# Patient Record
Sex: Male | Born: 1955 | Race: White | Hispanic: No | Marital: Single | Smoking: Former smoker
Health system: Southern US, Community
[De-identification: ages and names within clinical notes are randomized; demographics above are authoritative.]

## PROBLEM LIST (undated history)

## (undated) DIAGNOSIS — F17200 Nicotine dependence, unspecified, uncomplicated: Secondary | ICD-10-CM

## (undated) DIAGNOSIS — K0889 Other specified disorders of teeth and supporting structures: Secondary | ICD-10-CM

## (undated) DIAGNOSIS — Z923 Personal history of irradiation: Secondary | ICD-10-CM

## (undated) DIAGNOSIS — R221 Localized swelling, mass and lump, neck: Secondary | ICD-10-CM

## (undated) DIAGNOSIS — R634 Abnormal weight loss: Secondary | ICD-10-CM

## (undated) DIAGNOSIS — R490 Dysphonia: Secondary | ICD-10-CM

## (undated) DIAGNOSIS — C801 Malignant (primary) neoplasm, unspecified: Secondary | ICD-10-CM

## (undated) DIAGNOSIS — R070 Pain in throat: Secondary | ICD-10-CM

## (undated) DIAGNOSIS — R0602 Shortness of breath: Secondary | ICD-10-CM

## (undated) HISTORY — DX: Pain in throat: R07.0

## (undated) HISTORY — DX: Personal history of irradiation: Z92.3

## (undated) HISTORY — DX: Abnormal weight loss: R63.4

---

## 1996-01-18 HISTORY — PX: OTHER SURGICAL HISTORY: SHX169

## 2013-04-17 DIAGNOSIS — R221 Localized swelling, mass and lump, neck: Secondary | ICD-10-CM

## 2013-04-17 HISTORY — DX: Localized swelling, mass and lump, neck: R22.1

## 2013-04-29 ENCOUNTER — Emergency Department (INDEPENDENT_AMBULATORY_CARE_PROVIDER_SITE_OTHER)
Admission: EM | Admit: 2013-04-29 | Discharge: 2013-04-29 | Disposition: A | Discharge status: Home / Self Care | Payer: BC Managed Care – PPO | Source: Home / Self Care | Attending: Emergency Medicine | Admitting: Emergency Medicine

## 2013-04-29 ENCOUNTER — Encounter (HOSPITAL_COMMUNITY): Payer: Self-pay | Admitting: Emergency Medicine

## 2013-04-29 DIAGNOSIS — R490 Dysphonia: Secondary | ICD-10-CM

## 2013-04-29 DIAGNOSIS — R590 Localized enlarged lymph nodes: Secondary | ICD-10-CM

## 2013-04-29 DIAGNOSIS — J029 Acute pharyngitis, unspecified: Secondary | ICD-10-CM

## 2013-04-29 DIAGNOSIS — R599 Enlarged lymph nodes, unspecified: Secondary | ICD-10-CM

## 2013-04-29 LAB — POCT RAPID STREP A: STREPTOCOCCUS, GROUP A SCREEN (DIRECT): NEGATIVE

## 2013-04-29 MED ORDER — HYDROCODONE-ACETAMINOPHEN 5-325 MG PO TABS: ORAL_TABLET | ORAL | Status: DC

## 2013-04-29 NOTE — Discharge Instructions (Signed)
Pharyngitis °Pharyngitis is redness, pain, and swelling (inflammation) of your pharynx.  °CAUSES  °Pharyngitis is usually caused by infection. Most of the time, these infections are from viruses (viral) and are part of a cold. However, sometimes pharyngitis is caused by bacteria (bacterial). Pharyngitis can also be caused by allergies. Viral pharyngitis may be spread from person to person by coughing, sneezing, and personal items or utensils (cups, forks, spoons, toothbrushes). Bacterial pharyngitis may be spread from person to person by more intimate contact, such as kissing.  °SIGNS AND SYMPTOMS  °Symptoms of pharyngitis include:   °· Sore throat.   °· Tiredness (fatigue).   °· Low-grade fever.   °· Headache. °· Joint pain and muscle aches. °· Skin rashes. °· Swollen lymph nodes. °· Plaque-like film on throat or tonsils (often seen with bacterial pharyngitis). °DIAGNOSIS  °Your health care provider will ask you questions about your illness and your symptoms. Your medical history, along with a physical exam, is often all that is needed to diagnose pharyngitis. Sometimes, a rapid strep test is done. Other lab tests may also be done, depending on the suspected cause.  °TREATMENT  °Viral pharyngitis will usually get better in 3 4 days without the use of medicine. Bacterial pharyngitis is treated with medicines that kill germs (antibiotics).  °HOME CARE INSTRUCTIONS  °· Drink enough water and fluids to keep your urine clear or pale yellow.   °· Only take over-the-counter or prescription medicines as directed by your health care provider:   °· If you are prescribed antibiotics, make sure you finish them even if you start to feel better.   °· Do not take aspirin.   °· Get lots of rest.   °· Gargle with 8 oz of salt water (½ tsp of salt per 1 qt of water) as often as every 1 2 hours to soothe your throat.   °· Throat lozenges (if you are not at risk for choking) or sprays may be used to soothe your throat. °SEEK MEDICAL  CARE IF:  °· You have large, tender lumps in your neck. °· You have a rash. °· You cough up green, yellow-brown, or bloody spit. °SEEK IMMEDIATE MEDICAL CARE IF:  °· Your neck becomes stiff. °· You drool or are unable to swallow liquids. °· You vomit or are unable to keep medicines or liquids down. °· You have severe pain that does not go away with the use of recommended medicines. °· You have trouble breathing (not caused by a stuffy nose). °MAKE SURE YOU:  °· Understand these instructions. °· Will watch your condition. °· Will get help right away if you are not doing well or get worse. °Document Released: 01/03/2005 Document Revised: 10/24/2012 Document Reviewed: 09/10/2012 °[employer]® Patient Information ©2014 [employer]. ° °

## 2013-04-29 NOTE — ED Notes (Signed)
Sore throat for 2 weeks.  Denies any fever, denies sinus drainage.  Denies cough any more than usual.  Patient has visible fullness to right, lower jaw, in front of left ear.  When asked about this , patient declined having any concern or even noticing this fullness develop.

## 2013-04-29 NOTE — ED Provider Notes (Signed)
Chief Complaint   Chief Complaint  Patient presents with  . Sore Throat    History of Present Illness   Derek Morgan is a 58 year old male who has had a two week history of sore throat and hoarseness. His neck has been swollen on the right side for about the past month. He's had a dry, nonproductive cough. He denies any fever, chills, headache, or hemoptysis. He does smoke one of the half packs of cigarettes per day.   Review of Systems   Other than as noted above, the patient denies any of the following symptoms. Systemic:  No fever, chills, sweats, myalgias, or headache. Eye:  No redness, pain or drainage. ENT:  No earache, nasal congestion, sneezing, rhinorrhea, sinus pressure, sinus pain, or post nasal drip. Lungs:  No cough, sputum production, wheezing, shortness of breath, or chest pain. GI:  No abdominal pain, nausea, vomiting, or diarrhea. Skin:  No rash.  [place]   Past medical history, family history, social history, meds, and allergies were reviewed.   Physical Exam     Vital signs:  BP 187/92  Pulse 108  Temp(Src) 98.7 F (37.1 C) (Oral)  Resp 18  SpO2 98% General:  Alert, in no distress. Phonation was normal, no drooling, and patient was able to handle secretions well.  Eye:  No conjunctival injection or drainage. Lids were normal. ENT:  TMs and canals were normal, without erythema or inflammation.  Nasal mucosa was clear and uncongested, without drainage.  Mucous membranes were moist.  Exam of pharynx was unremarkable. There was no erythema or exudate, no ulcerations or mass..  There were no oral ulcerations or lesions. There was no bulging of the tonsillar pillars, and the uvula was midline. Neck:  Supple, He has a large, rockhard mass of lymphadenopathy in the right anterior cervical area which was nontender to touch. Lungs:  No respiratory distress.  Lungs were clear to auscultation, without wheezes, rales or rhonchi.  Breath sounds were clear and equal  bilaterally.  Heart:  Regular rhythm, without gallops, murmers or rubs. Skin:  Clear, warm, and dry, without rash or lesions.  Labs   Results for orders placed during the hospital encounter of 04/29/13  POCT RAPID STREP A ([facility] ONLY)      Result Value Ref Range   Streptococcus, Group A Screen (Direct) NEGATIVE  NEGATIVE    Assessment   The primary encounter diagnosis was Sore throat. Diagnoses of Hoarseness and Cervical lymphadenopathy were also pertinent to this visit.  I am concerned about the diagnosis of cancer. I have told the patient this. I have made an appointment for him to see Dr. Benjamine Mola later on this week.  Plan     1.  Meds:  The following meds were prescribed:   Discharge Medication List as of 04/29/2013 10:02 AM    START taking these medications   Details  HYDROcodone-acetaminophen (NORCO/VICODIN) 5-325 MG per tablet 1 to 2 tabs every 4 to 6 hours as needed for pain., Print        2.  Patient Education/Counseling:  The patient was given appropriate handouts, self care instructions, and instructed in symptomatic relief, including hot saline gargles, and throat lozenges.  3.  Follow up:  The patient was told to follow up here if no better in 3 to 4 days, or sooner if becoming worse in any way, and given some red flag symptoms such as difficulty swallowing or breathing which would prompt immediate return.  Follow-up with Dr.  Teoh later on this week.     Harden Mo, MD 04/29/13 [phone]

## 2013-05-01 LAB — CULTURE, GROUP A STREP

## 2013-05-02 ENCOUNTER — Other Ambulatory Visit (INDEPENDENT_AMBULATORY_CARE_PROVIDER_SITE_OTHER): Payer: Self-pay | Admitting: Otolaryngology

## 2013-05-02 DIAGNOSIS — J3800 Paralysis of vocal cords and larynx, unspecified: Secondary | ICD-10-CM

## 2013-05-02 DIAGNOSIS — R221 Localized swelling, mass and lump, neck: Secondary | ICD-10-CM

## 2013-05-06 ENCOUNTER — Ambulatory Visit
Admission: RE | Admit: 2013-05-06 | Discharge: 2013-05-06 | Disposition: A | Discharge status: Home / Self Care | Payer: BC Managed Care – PPO | Source: Ambulatory Visit | Attending: Otolaryngology | Admitting: Otolaryngology

## 2013-05-06 DIAGNOSIS — J3800 Paralysis of vocal cords and larynx, unspecified: Secondary | ICD-10-CM

## 2013-05-06 DIAGNOSIS — R221 Localized swelling, mass and lump, neck: Secondary | ICD-10-CM

## 2013-05-06 MED ORDER — IOHEXOL 300 MG/ML  SOLN
75.0000 mL | Freq: Once | INTRAMUSCULAR | Status: AC | PRN
  Administered 2013-05-06: 75 mL via INTRAVENOUS

## 2013-05-08 ENCOUNTER — Other Ambulatory Visit: Payer: Self-pay | Admitting: Otolaryngology

## 2013-05-09 ENCOUNTER — Encounter (HOSPITAL_BASED_OUTPATIENT_CLINIC_OR_DEPARTMENT_OTHER): Payer: Self-pay | Admitting: *Deleted

## 2013-05-09 NOTE — Progress Notes (Signed)
Pt has no family here-smokes-a friend is coming with him-he will ck about someone staying with him post op

## 2013-05-09 NOTE — Progress Notes (Signed)
Reviewed case with dr crews-per anesthesia consult request dr teoh-dr crews said surgery needs to be done main or due to airway issues-dr teohs office notified.

## 2013-05-14 ENCOUNTER — Encounter (HOSPITAL_BASED_OUTPATIENT_CLINIC_OR_DEPARTMENT_OTHER): Admission: RE | Payer: Self-pay | Source: Ambulatory Visit

## 2013-05-14 ENCOUNTER — Ambulatory Visit (HOSPITAL_BASED_OUTPATIENT_CLINIC_OR_DEPARTMENT_OTHER): Admission: RE | Admit: 2013-05-14 | Payer: BLUE CROSS/BLUE SHIELD | Source: Ambulatory Visit | Admitting: Otolaryngology

## 2013-05-14 HISTORY — DX: Dysphonia: R49.0

## 2013-05-14 HISTORY — DX: Nicotine dependence, unspecified, uncomplicated: F17.200

## 2013-05-14 HISTORY — DX: Other specified disorders of teeth and supporting structures: K08.89

## 2013-05-14 HISTORY — DX: Localized swelling, mass and lump, neck: R22.1

## 2013-05-14 SURGERY — LARYNGOSCOPY, DIRECT
Anesthesia: General | Site: Neck | Laterality: Right

## 2013-05-16 ENCOUNTER — Encounter (HOSPITAL_COMMUNITY): Payer: Self-pay | Admitting: Pharmacy Technician

## 2013-05-21 ENCOUNTER — Other Ambulatory Visit (HOSPITAL_COMMUNITY): Payer: Self-pay | Admitting: *Deleted

## 2013-05-21 ENCOUNTER — Encounter (HOSPITAL_COMMUNITY): Payer: Self-pay | Admitting: *Deleted

## 2013-05-21 NOTE — Progress Notes (Signed)
Pt states he has several loose teeth.

## 2013-05-22 ENCOUNTER — Encounter (HOSPITAL_COMMUNITY): Payer: BC Managed Care – PPO | Admitting: Anesthesiology

## 2013-05-22 ENCOUNTER — Ambulatory Visit (HOSPITAL_COMMUNITY)
Admission: RE | Admit: 2013-05-22 | Discharge: 2013-05-22 | Disposition: A | Discharge status: Home / Self Care | Payer: BC Managed Care – PPO | Source: Ambulatory Visit | Attending: Otolaryngology | Admitting: Otolaryngology

## 2013-05-22 ENCOUNTER — Encounter (HOSPITAL_COMMUNITY)
Admission: RE | Disposition: A | Discharge status: Home / Self Care | Payer: Self-pay | Source: Ambulatory Visit | Attending: Otolaryngology

## 2013-05-22 ENCOUNTER — Encounter (HOSPITAL_COMMUNITY): Payer: Self-pay | Admitting: Anesthesiology

## 2013-05-22 ENCOUNTER — Ambulatory Visit (HOSPITAL_COMMUNITY): Payer: BC Managed Care – PPO | Admitting: Anesthesiology

## 2013-05-22 DIAGNOSIS — M629 Disorder of muscle, unspecified: Secondary | ICD-10-CM | POA: Insufficient documentation

## 2013-05-22 DIAGNOSIS — F17200 Nicotine dependence, unspecified, uncomplicated: Secondary | ICD-10-CM | POA: Insufficient documentation

## 2013-05-22 DIAGNOSIS — J3801 Paralysis of vocal cords and larynx, unilateral: Secondary | ICD-10-CM | POA: Insufficient documentation

## 2013-05-22 DIAGNOSIS — M2420 Disorder of ligament, unspecified site: Secondary | ICD-10-CM | POA: Insufficient documentation

## 2013-05-22 DIAGNOSIS — R599 Enlarged lymph nodes, unspecified: Secondary | ICD-10-CM | POA: Insufficient documentation

## 2013-05-22 DIAGNOSIS — Z9089 Acquired absence of other organs: Secondary | ICD-10-CM

## 2013-05-22 DIAGNOSIS — J358 Other chronic diseases of tonsils and adenoids: Secondary | ICD-10-CM | POA: Insufficient documentation

## 2013-05-22 HISTORY — PX: MASS BIOPSY: SHX5445

## 2013-05-22 HISTORY — PX: DIRECT LARYNGOSCOPY: SHX5326

## 2013-05-22 HISTORY — PX: TONSILLECTOMY: SHX5217

## 2013-05-22 LAB — CBC
HEMATOCRIT: 45.3 % (ref 39.0–52.0)
Hemoglobin: 15.6 g/dL (ref 13.0–17.0)
MCH: 33.8 pg (ref 26.0–34.0)
MCHC: 34.4 g/dL (ref 30.0–36.0)
MCV: 98.3 fL (ref 78.0–100.0)
Platelets: 347 10*3/uL (ref 150–400)
RBC: 4.61 MIL/uL (ref 4.22–5.81)
RDW: 13.1 % (ref 11.5–15.5)
WBC: 11.1 10*3/uL — ABNORMAL HIGH (ref 4.0–10.5)

## 2013-05-22 SURGERY — LARYNGOSCOPY, DIRECT
Anesthesia: General | Site: Neck | Laterality: Right

## 2013-05-22 MED ORDER — EPINEPHRINE HCL (NASAL) 0.1 % NA SOLN
NASAL | Status: AC
  Filled 2013-05-22: qty 30

## 2013-05-22 MED ORDER — LIDOCAINE-EPINEPHRINE 1 %-1:100000 IJ SOLN
INTRAMUSCULAR | Status: AC
  Filled 2013-05-22: qty 1

## 2013-05-22 MED ORDER — OXYCODONE HCL 5 MG/5ML PO SOLN: 5.0000 mg | Freq: Once | ORAL | Status: AC | PRN

## 2013-05-22 MED ORDER — FENTANYL CITRATE 0.05 MG/ML IJ SOLN
INTRAMUSCULAR | Status: DC | PRN
  Administered 2013-05-22 (×2): 50 ug via INTRAVENOUS
  Administered 2013-05-22: 100 ug via INTRAVENOUS
  Administered 2013-05-22: 50 ug via INTRAVENOUS

## 2013-05-22 MED ORDER — ROCURONIUM BROMIDE 50 MG/5ML IV SOLN
INTRAVENOUS | Status: AC
  Filled 2013-05-22: qty 1

## 2013-05-22 MED ORDER — PROPOFOL 10 MG/ML IV BOLUS
INTRAVENOUS | Status: DC | PRN
  Administered 2013-05-22: 200 mg via INTRAVENOUS

## 2013-05-22 MED ORDER — DEXAMETHASONE SODIUM PHOSPHATE 4 MG/ML IJ SOLN
INTRAMUSCULAR | Status: DC | PRN
  Administered 2013-05-22: 8 mg via INTRAVENOUS

## 2013-05-22 MED ORDER — NEOSTIGMINE METHYLSULFATE 10 MG/10ML IV SOLN
INTRAVENOUS | Status: AC
  Filled 2013-05-22: qty 1

## 2013-05-22 MED ORDER — OXYCODONE HCL 5 MG/5ML PO SOLN: 5.0000 mg | ORAL | Status: DC | PRN

## 2013-05-22 MED ORDER — ROCURONIUM BROMIDE 100 MG/10ML IV SOLN
INTRAVENOUS | Status: DC | PRN
Start: 2013-05-22 — End: 2013-05-22
  Administered 2013-05-22: 25 mg via INTRAVENOUS

## 2013-05-22 MED ORDER — GLYCOPYRROLATE 0.2 MG/ML IJ SOLN
INTRAMUSCULAR | Status: DC | PRN
  Administered 2013-05-22: 0.4 mg via INTRAVENOUS

## 2013-05-22 MED ORDER — SODIUM CHLORIDE 0.9 % IR SOLN
Status: DC | PRN
  Administered 2013-05-22: 1000 mL

## 2013-05-22 MED ORDER — GLYCOPYRROLATE 0.2 MG/ML IJ SOLN
INTRAMUSCULAR | Status: AC
  Filled 2013-05-22: qty 3

## 2013-05-22 MED ORDER — HYDROMORPHONE HCL PF 1 MG/ML IJ SOLN: 0.2500 mg | INTRAMUSCULAR | Status: DC | PRN

## 2013-05-22 MED ORDER — NEOSTIGMINE METHYLSULFATE 10 MG/10ML IV SOLN
INTRAVENOUS | Status: DC | PRN
  Administered 2013-05-22: 3 mg via INTRAVENOUS

## 2013-05-22 MED ORDER — OXYMETAZOLINE HCL 0.05 % NA SOLN
NASAL | Status: AC
  Filled 2013-05-22: qty 15

## 2013-05-22 MED ORDER — PHENYLEPHRINE HCL 10 MG/ML IJ SOLN
INTRAMUSCULAR | Status: DC | PRN
  Administered 2013-05-22 (×2): 80 ug via INTRAVENOUS

## 2013-05-22 MED ORDER — ONDANSETRON HCL 4 MG/2ML IJ SOLN
INTRAMUSCULAR | Status: DC | PRN
  Administered 2013-05-22: 4 mg via INTRAVENOUS

## 2013-05-22 MED ORDER — ONDANSETRON HCL 4 MG/2ML IJ SOLN
INTRAMUSCULAR | Status: AC
  Filled 2013-05-22: qty 2

## 2013-05-22 MED ORDER — METOCLOPRAMIDE HCL 5 MG/ML IJ SOLN: 10.0000 mg | Freq: Once | INTRAMUSCULAR | Status: DC | PRN

## 2013-05-22 MED ORDER — 0.9 % SODIUM CHLORIDE (POUR BTL) OPTIME
TOPICAL | Status: DC | PRN
  Administered 2013-05-22: 1000 mL

## 2013-05-22 MED ORDER — LIDOCAINE HCL (CARDIAC) 20 MG/ML IV SOLN
INTRAVENOUS | Status: AC
  Filled 2013-05-22: qty 5

## 2013-05-22 MED ORDER — CEFAZOLIN SODIUM-DEXTROSE 2-3 GM-% IV SOLR
INTRAVENOUS | Status: DC | PRN
  Administered 2013-05-22: 2 g via INTRAVENOUS

## 2013-05-22 MED ORDER — OXYMETAZOLINE HCL 0.05 % NA SOLN
NASAL | Status: DC | PRN
  Administered 2013-05-22: 1 via NASAL

## 2013-05-22 MED ORDER — OXYCODONE HCL 5 MG PO TABS
ORAL_TABLET | ORAL | Status: AC
  Filled 2013-05-22: qty 1

## 2013-05-22 MED ORDER — DEXAMETHASONE SODIUM PHOSPHATE 4 MG/ML IJ SOLN
INTRAMUSCULAR | Status: AC
  Filled 2013-05-22: qty 3

## 2013-05-22 MED ORDER — AMOXICILLIN 400 MG/5ML PO SUSR: 800.0000 mg | Freq: Two times a day (BID) | ORAL | Status: AC

## 2013-05-22 MED ORDER — OXYCODONE HCL 5 MG PO TABS
5.0000 mg | ORAL_TABLET | Freq: Once | ORAL | Status: AC | PRN
  Administered 2013-05-22: 5 mg via ORAL

## 2013-05-22 MED ORDER — MIDAZOLAM HCL 5 MG/5ML IJ SOLN
INTRAMUSCULAR | Status: DC | PRN
  Administered 2013-05-22: 2 mg via INTRAVENOUS

## 2013-05-22 MED ORDER — LACTATED RINGERS IV SOLN
INTRAVENOUS | Status: DC
  Administered 2013-05-22 (×2): via INTRAVENOUS

## 2013-05-22 MED ORDER — HEMOSTATIC AGENTS (NO CHARGE) OPTIME
TOPICAL | Status: DC | PRN
  Administered 2013-05-22: 1 via TOPICAL

## 2013-05-22 MED ORDER — FENTANYL CITRATE 0.05 MG/ML IJ SOLN
INTRAMUSCULAR | Status: AC
  Filled 2013-05-22: qty 5

## 2013-05-22 SURGICAL SUPPLY — 51 items
ADH SKN CLS APL DERMABOND .7 (GAUZE/BANDAGES/DRESSINGS) ×3
BLADE 10 SAFETY STRL DISP (BLADE) ×3 IMPLANT
CANISTER SUCTION 2500CC (MISCELLANEOUS) ×5 IMPLANT
CATH ROBINSON RED A/P 10FR (CATHETERS) ×2 IMPLANT
COVER LIGHT HANDLE  DEROYL (MISCELLANEOUS) ×2 IMPLANT
COVER MAYO STAND STRL (DRAPES) ×2 IMPLANT
COVER TABLE BACK 60X90 (DRAPES) ×5 IMPLANT
DERMABOND ADVANCED (GAUZE/BANDAGES/DRESSINGS) ×2
DERMABOND ADVANCED .7 DNX12 (GAUZE/BANDAGES/DRESSINGS) IMPLANT
DRAPE PROXIMA HALF (DRAPES) ×3 IMPLANT
ELECT COATED BLADE 2.86 ST (ELECTRODE) ×2 IMPLANT
ELECT REM PT RETURN 9FT ADLT (ELECTROSURGICAL) ×5
ELECT REM PT RETURN 9FT PED (ELECTROSURGICAL)
ELECTRODE REM PT RETRN 9FT PED (ELECTROSURGICAL) IMPLANT
ELECTRODE REM PT RTRN 9FT ADLT (ELECTROSURGICAL) IMPLANT
GAUZE SPONGE 4X4 16PLY XRAY LF (GAUZE/BANDAGES/DRESSINGS) ×7 IMPLANT
GLOVE BIO SURGEON STRL SZ7.5 (GLOVE) ×5 IMPLANT
GLOVE BIOGEL PI IND STRL 6.5 (GLOVE) IMPLANT
GLOVE BIOGEL PI INDICATOR 6.5 (GLOVE) ×4
GLOVE ECLIPSE 7.5 STRL STRAW (GLOVE) ×3 IMPLANT
GLOVE SURG SS PI 6.5 STRL IVOR (GLOVE) ×4 IMPLANT
GLOVE SURG SS PI 7.5 STRL IVOR (GLOVE) ×2 IMPLANT
GOWN STRL REUS W/ TWL LRG LVL3 (GOWN DISPOSABLE) ×6 IMPLANT
GOWN STRL REUS W/TWL LRG LVL3 (GOWN DISPOSABLE) ×10
GUARD TEETH (MISCELLANEOUS) ×5 IMPLANT
HEMOSTAT SURGICEL 2X14 (HEMOSTASIS) ×2 IMPLANT
KIT BASIN OR (CUSTOM PROCEDURE TRAY) ×5 IMPLANT
KIT ROOM TURNOVER OR (KITS) ×5 IMPLANT
MARKER SKIN DUAL TIP RULER LAB (MISCELLANEOUS) ×2 IMPLANT
NS IRRIG 1000ML POUR BTL (IV SOLUTION) ×5 IMPLANT
PACK SURGICAL SETUP 50X90 (CUSTOM PROCEDURE TRAY) ×5 IMPLANT
PAD ARMBOARD 7.5X6 YLW CONV (MISCELLANEOUS) ×10 IMPLANT
PATTIES SURGICAL .5 X1 (DISPOSABLE) ×2 IMPLANT
PENCIL BUTTON HOLSTER BLD 10FT (ELECTRODE) ×2 IMPLANT
SOLUTION ANTI FOG 6CC (MISCELLANEOUS) IMPLANT
SPECIMEN JAR SMALL (MISCELLANEOUS) ×4 IMPLANT
SPONGE GAUZE 4X4 12PLY (GAUZE/BANDAGES/DRESSINGS) ×5 IMPLANT
SPONGE TONSIL 1 RF SGL (DISPOSABLE) ×5 IMPLANT
SURGILUBE 2OZ TUBE FLIPTOP (MISCELLANEOUS) ×3 IMPLANT
SUT VIC AB 3-0 SH 27 (SUTURE)
SUT VIC AB 3-0 SH 27X BRD (SUTURE) IMPLANT
SUT VICRYL 4-0 PS2 18IN ABS (SUTURE) ×2 IMPLANT
SYR BULB 3OZ (MISCELLANEOUS) ×5 IMPLANT
TOWEL OR 17X24 6PK STRL BLUE (TOWEL DISPOSABLE) ×10 IMPLANT
TOWEL OR 17X26 10 PK STRL BLUE (TOWEL DISPOSABLE) ×5 IMPLANT
TRAY ENT MC OR (CUSTOM PROCEDURE TRAY) ×2 IMPLANT
TUBE CONNECTING 12'X1/4 (SUCTIONS) ×2
TUBE CONNECTING 12X1/4 (SUCTIONS) ×5 IMPLANT
TUBE SALEM SUMP 12R W/ARV (TUBING) ×2 IMPLANT
WAND COBLATOR 70 EVAC XTRA (SURGICAL WAND) ×5 IMPLANT
WATER STERILE IRR 1000ML POUR (IV SOLUTION) ×3 IMPLANT

## 2013-05-22 NOTE — Brief Op Note (Signed)
05/22/2013  12:45 PM  PATIENT:  Derek Morgan  58 y.o. male  PRE-OPERATIVE DIAGNOSIS:  RIGHT NECK AND TONSIL MASS  POST-OPERATIVE DIAGNOSIS:  RIGHT NECK AND TONSIL MASS  PROCEDURE:  Procedure(s): DIRECT LARYNGOSCOPY (N/A) RIGHT TONSILLECTOMY (Right)  INCISIONAL BIOPSY NECK MASS (Right)  SURGEON:  Surgeon(s) and Role:    * Ascencion Dike, MD - Primary  PHYSICIAN ASSISTANT:   ASSISTANTS: none   ANESTHESIA:   general  EBL:  Total I/O In: 1010 [I.V.:1010] Out: -   BLOOD ADMINISTERED:none  DRAINS: none   LOCAL MEDICATIONS USED:  LIDOCAINE   SPECIMEN:  Source of Specimen:  Right tonsil and right neck mass biopsy  DISPOSITION OF SPECIMEN:  PATHOLOGY  COUNTS:  YES  TOURNIQUET:  * No tourniquets in log *  DICTATION: .Other Dictation: Dictation Number [id number]  PLAN OF CARE: Discharge to home after PACU  PATIENT DISPOSITION:  PACU - hemodynamically stable.   Delay start of Pharmacological VTE agent (>24hrs) due to surgical blood loss or risk of bleeding: not applicable

## 2013-05-22 NOTE — Progress Notes (Signed)
Patient states he does not have anyone to stay home with him this evening. Dr. Anthony Sar notified and states he is fine with patient going home by himself.

## 2013-05-22 NOTE — Anesthesia Preprocedure Evaluation (Addendum)
Anesthesia Evaluation  Patient identified by MRN, date of birth, ID band Patient awake    Reviewed: Allergy & Precautions, H&P , NPO status , Patient's Chart, lab work & pertinent test results, reviewed documented beta blocker date and time   Airway Mallampati: II TM Distance: >3 FB Neck ROM: Limited  Mouth opening: Limited Mouth Opening  Dental  (+) Loose   Pulmonary neg pulmonary ROS, Current Smoker,  breath sounds clear to auscultation        Cardiovascular negative cardio ROS  Rhythm:regular     Neuro/Psych negative neurological ROS  negative psych ROS   GI/Hepatic negative GI ROS, Neg liver ROS,   Endo/Other  negative endocrine ROS  Renal/GU negative Renal ROS  negative genitourinary   Musculoskeletal   Abdominal   Peds  Hematology negative hematology ROS (+)   Anesthesia Other Findings See surgeon's H&P   Reproductive/Obstetrics negative OB ROS                        Anesthesia Physical Anesthesia Plan  ASA: II  Anesthesia Plan: General   Post-op Pain Management:    Induction: Intravenous  Airway Management Planned: Oral ETT and Video Laryngoscope Planned  Additional Equipment:   Intra-op Plan:   Post-operative Plan: Extubation in OR  Informed Consent: I have reviewed the patients History and Physical, chart, labs and discussed the procedure including the risks, benefits and alternatives for the proposed anesthesia with the patient or authorized representative who has indicated his/her understanding and acceptance.   Dental Advisory Given  Plan Discussed with: CRNA and Surgeon  Anesthesia Plan Comments:        Anesthesia Quick Evaluation

## 2013-05-22 NOTE — Anesthesia Procedure Notes (Signed)
Procedure Name: Intubation Date/Time: 05/22/2013 11:52 AM Performed by: Eligha Bridegroom Pre-anesthesia Checklist: Emergency Drugs available, Patient identified, Timeout performed, Suction available and Patient being monitored Patient Re-evaluated:Patient Re-evaluated prior to inductionOxygen Delivery Method: Circle system utilized Preoxygenation: Pre-oxygenation with 100% oxygen Intubation Type: IV induction Ventilation: Oral airway inserted - appropriate to patient size and Mask ventilation without difficulty Grade View: Grade III Tube type: Oral Tube size: 7.5 mm Number of attempts: 1 Airway Equipment and Method: Video-laryngoscopy Placement Confirmation: ETT inserted through vocal cords under direct vision and breath sounds checked- equal and bilateral Secured at: 21 cm Dental Injury: Teeth and Oropharynx as per pre-operative assessment

## 2013-05-22 NOTE — Transfer of Care (Signed)
Immediate Anesthesia Transfer of Care Note  Patient: Derek Morgan  Procedure(s) Performed: Procedure(s): DIRECT LARYNGOSCOPY (N/A) RIGHT TONSILLECTOMY (Right)  INCISIONAL BIOPSY NECK MASS (Right)  Patient Location: PACU  Anesthesia Type:General  Level of Consciousness: awake, alert  and oriented  Airway & Oxygen Therapy: Patient Spontanous Breathing and Patient connected to nasal cannula oxygen  Post-op Assessment: Report given to PACU RN and Post -op Vital signs reviewed and stable  Post vital signs: Reviewed and stable  Complications: No apparent anesthesia complications

## 2013-05-22 NOTE — H&P (Signed)
  H&P Update  Pt's original H&P dated 05/08/13 reviewed and placed in chart (to be scanned).  I personally examined the patient today.  No change in health. Proceed with right tonsillectomy, direct laryngoscopy, and neck mass biopsy.

## 2013-05-22 NOTE — Discharge Instructions (Signed)
[facility]ol[facility] Dowson WOO[facility]anish[facility] Drewry M.D., P.A[facility] Postoperative Instructions for Tonsillectomy  Activity Restrict activity at home for the first two days, resting as much as possible. Light indoor activity is best. You may usually return to school or work within a week but void strenuous activity and sports for two weeks. Sleep with your head elevated on 2-3 pillows for 3-4 days to help decrease swelling. Diet Due to tissue swelling and throat discomfort, you may have little desire to drink for several days. However fluids are very important to prevent dehydration. You will find that non-acidic juices, soups, popsicles, Jell-O, custard, puddings, and any soft or mashed foods taken in small quantities can be swallowed fairly easily. Try to increase your fluid and food intake as the discomfort subsides. It is recommended that a child receive 1-1/2 quarts of fluid in a 24-hour period. Adult require twice this amount.  Discomfort Your sore throat may be relieved by applying an ice collar to your neck and/or by taking Tylenol. You may experience an earache, which is due to referred pain from the throat. Referred ear pain is commonly felt at night when trying to rest.  Bleeding                         Although rare, there is risk of having some bleeding during the first 2 weeks after having a T&A. This usually happens between days 7-10 postoperatively. If you or your child should have any bleeding, try to remain calm. We recommend sitting up quietly in a chair and gently spitting out the blood into a bowl. For adults, gargling gently with ice water may help. If the bleeding does not stop after a short time (5 minutes), is more than 1 teaspoonful, or if you become worried, please call our office at [phone] or go directly to the nearest hospital emergency room. Do not eat or drink anything prior to going to the hospital as you may need to be taken to the operating room in order to control the bleeding. GENERAL  CONSIDERATIONS 1. Brush your teeth regularly. Avoid mouthwashes and gargles for three weeks. You may gargle gently with warm salt-water as necessary or spray with Chloraseptic. You may make salt-water by placing 2 teaspoons of table salt into a quart of fresh water. Warm the salt-water in a microwave to a luke warm temperature.  2. Avoid exposure to colds and upper respiratory infections if possible.  3. If you look into a mirror or into your child's mouth, you will see white-gray patches in the back of the throat. This is normal after having a T&A and is like a scab that forms on the skin after an abrasion. It will disappear once the back of the throat heals completely. However, it may cause a noticeable odor; this too will disappear with time. Again, warm salt-water gargles may be used to help keep the throat clean and promote healing.  4. You may notice a temporary change in voice quality, such as a higher pitched voice or a nasal sound, until healing is complete. This may last for 1-2 weeks and should resolve.  5. Do not take or give you child any medications that we have not prescribed or recommended.  6. Snoring may occur, especially at night, for the first week after a T&A. It is due to swelling of the soft palate and will usually resolve.  Please call our office at [phone] if you have any questions.   

## 2013-05-22 NOTE — Anesthesia Postprocedure Evaluation (Signed)
Anesthesia Post Note  Patient: Derek Morgan  Procedure(s) Performed: Procedure(s) (LRB): DIRECT LARYNGOSCOPY (N/A) RIGHT TONSILLECTOMY (Right)  INCISIONAL BIOPSY NECK MASS (Right)  Anesthesia type: General  Patient location: PACU  Post pain: Pain level controlled  Post assessment: Patient's Cardiovascular Status Stable  Last Vitals:  Filed Vitals:   05/22/13 1330  BP: 161/79  Pulse: 76  Temp: 36.9 C  Resp: 15    Post vital signs: Reviewed and stable  Level of consciousness: alert  Complications: No apparent anesthesia complications

## 2013-05-22 NOTE — OR Nursing (Signed)
1210: First procedure ended.

## 2013-05-23 NOTE — Op Note (Signed)
NAMEJOEVANNI, RODDEY               ACCOUNT NO.:  [id number]  MEDICAL RECORD NO.:  [id number]  LOCATION:  [facility]                         FACILITY:  [facility]  PHYSICIAN:  Leta Baptist, MD            DATE OF BIRTH:  1955-02-25  DATE OF PROCEDURE:  05/22/2013 DATE OF DISCHARGE:  05/22/2013                              OPERATIVE REPORT   SURGEON:  Leta Baptist, MD  PREOPERATIVE DIAGNOSES: 1. Right neck mass. 2. Right tonsil mass. 3. Right vocal cord paralysis.  POSTOPERATIVE DIAGNOSES: 1. Right neck mass. 2. Right tonsil mass. 3. Right vocal cord paralysis.  PROCEDURE PERFORMED: 1. Right tonsillectomy. 2. Incisional biopsy of the right neck mass. 3. Direct laryngoscopy.  ANESTHESIA:  General endotracheal tube anesthesia.  COMPLICATIONS:  None.  ESTIMATED BLOOD LOSS:  Minimal.  INDICATION FOR PROCEDURE:  The patient is a 58 year old male with a history of an enlarging right neck mass.  He previously underwent fine- needle aspiration biopsy of the mass.  The result was inconclusive, but worrisome for malignancy.  He subsequently underwent a neck CT scan. The CT showed 6-cm right level 2 neck mass, with invasion noted into the right jugular vein.  In addition, an ill-defined mass was noted to involve the inferior aspect of the right tonsil and the upper aryepiglottic fold.  It should also be noted that the patient has paralysis of his right vocal cord.  Based on the above findings, the decision was made for the patient to undergo the above stated procedure for definitive diagnosis of his tonsil and neck mass.  The risks, benefits, alternatives, and details of the procedure were discussed with the patient.  Questions were invited and answered.  Informed consent was obtained.  DESCRIPTION OF PROCEDURE:  The patient was taken to the operating room and placed supine on the operating table.  General endotracheal tube anesthesia was administered by the anesthesiologist.  The patient  was positioned and prepped and draped in a standard fashion for direct laryngoscopy and tonsillectomy.  Attention was first focused on the direct laryngoscopy portion of the case.  A Dedo laryngoscope was inserted via the oral cavity into the pharynx.  Examination of the tonsillar fossa reveals a 1-cm irregular mass at the inferior aspect of the right tonsillar fossa.  The mass appears to extend to the superior portion of the right area epiglottic fold.  The rest of the larynx was noted to be free of abnormality.  The epiglottis, vocal cords, arytenoids, piriform sinuses, and vallecula were all free of disease. The Dedo laryngoscope was withdrawn.  Crowe-Davis mouth gag was inserted into the oral cavity for exposure. The previously noted right inferior tonsillar mass was again noted.  The entire right tonsil was grasped with a straight Allis clamp and retracted medially.  It was resected free from the underlying pharyngeal constrictor muscles with the Coblator device.  Hemostasis was also achieved with the Coblator device.  The tonsillar fossa was irrigated with saline solution.  The Crowe-Davis mouth gag was removed.  The patient was repositioned for the right neck biopsy.  The patient was noted to have a large 6-cm mass within the right level 2  neck.  The mass was firm and nonmobile.  A 1% lidocaine with 1:100,000 epinephrine was injected at the planned site of incision.  A posterior neck incision was made, overlying the central portion of the mass.  The incision was carried down to the level of the platysma muscles.  Platysma muscle was carefully dissected free from the underlying soft tissue mass.  The great auricular nerve was identified and preserved.  Using a #15 blade, multiple incisional specimen was obtained from the large mass.  At least 4 pieces of large specimens were obtained and sent to the Pathology Department for permanent histologic identification.  Hemostasis  was achieved with Bovie electrocautery and Surgicel.  The surgical site was irrigated.  The incision was closed in layers with 4-0 Vicryl and Dermabond.  The care of the patient was turned over to the anesthesiologist.  The patient was awakened from anesthesia without difficulty.  He was extubated and transferred to the recovery room in good condition.  OPERATIVE FINDINGS: 1. A 1-cm mass was noted at right inferior tonsillar fossa. 2. A large 6-cm right level 2 neck mass was also noted.  SPECIMEN:  Right tonsil and incisional biopsy of the right neck mass.  FOLLOWUP CARE:  The patient will be discharged home once he is awake and alert.  He will be placed on oxycodone p.r.n. pain and amoxicillin p.o. b.i.d. for 5 days.  The patient will follow up in my office in approximately 1 week.     Leta Baptist, MD     ST/MEDQ  D:  05/22/2013  T:  05/23/2013  Job:  [id number]

## 2013-05-24 ENCOUNTER — Encounter (HOSPITAL_COMMUNITY): Payer: Self-pay | Admitting: Otolaryngology

## 2013-05-30 ENCOUNTER — Other Ambulatory Visit: Payer: Self-pay | Admitting: Otolaryngology

## 2013-06-03 ENCOUNTER — Encounter (HOSPITAL_COMMUNITY): Payer: Self-pay | Admitting: Pharmacist

## 2013-06-06 ENCOUNTER — Encounter (HOSPITAL_COMMUNITY): Payer: Self-pay | Admitting: *Deleted

## 2013-06-07 ENCOUNTER — Encounter (HOSPITAL_COMMUNITY): Payer: BC Managed Care – PPO | Admitting: Anesthesiology

## 2013-06-07 ENCOUNTER — Encounter (HOSPITAL_COMMUNITY)
Admission: RE | Disposition: A | Discharge status: Home / Self Care | Payer: Self-pay | Source: Ambulatory Visit | Attending: Otolaryngology

## 2013-06-07 ENCOUNTER — Ambulatory Visit (HOSPITAL_COMMUNITY): Payer: BC Managed Care – PPO | Admitting: Anesthesiology

## 2013-06-07 ENCOUNTER — Ambulatory Visit (HOSPITAL_COMMUNITY)
Admission: RE | Admit: 2013-06-07 | Discharge: 2013-06-07 | Disposition: A | Discharge status: Home / Self Care | Payer: BC Managed Care – PPO | Source: Ambulatory Visit | Attending: Otolaryngology | Admitting: Otolaryngology

## 2013-06-07 ENCOUNTER — Encounter (HOSPITAL_COMMUNITY): Payer: Self-pay | Admitting: *Deleted

## 2013-06-07 DIAGNOSIS — J3800 Paralysis of vocal cords and larynx, unspecified: Secondary | ICD-10-CM | POA: Insufficient documentation

## 2013-06-07 DIAGNOSIS — C4442 Squamous cell carcinoma of skin of scalp and neck: Secondary | ICD-10-CM | POA: Insufficient documentation

## 2013-06-07 DIAGNOSIS — R221 Localized swelling, mass and lump, neck: Secondary | ICD-10-CM

## 2013-06-07 DIAGNOSIS — F17200 Nicotine dependence, unspecified, uncomplicated: Secondary | ICD-10-CM | POA: Insufficient documentation

## 2013-06-07 HISTORY — PX: MASS BIOPSY: SHX5445

## 2013-06-07 LAB — CBC
HEMATOCRIT: 47.7 % (ref 39.0–52.0)
Hemoglobin: 16.3 g/dL (ref 13.0–17.0)
MCH: 34 pg (ref 26.0–34.0)
MCHC: 34.2 g/dL (ref 30.0–36.0)
MCV: 99.6 fL (ref 78.0–100.0)
Platelets: 344 10*3/uL (ref 150–400)
RBC: 4.79 MIL/uL (ref 4.22–5.81)
RDW: 13.2 % (ref 11.5–15.5)
WBC: 12.1 10*3/uL — ABNORMAL HIGH (ref 4.0–10.5)

## 2013-06-07 SURGERY — BIOPSY, MASS, NECK
Anesthesia: General | Site: Neck | Laterality: Right

## 2013-06-07 MED ORDER — MIDAZOLAM HCL 5 MG/5ML IJ SOLN
INTRAMUSCULAR | Status: DC | PRN
  Administered 2013-06-07: 1 mg via INTRAVENOUS

## 2013-06-07 MED ORDER — SUCCINYLCHOLINE CHLORIDE 20 MG/ML IJ SOLN
INTRAMUSCULAR | Status: DC | PRN
  Administered 2013-06-07: 100 mg via INTRAVENOUS

## 2013-06-07 MED ORDER — OXYCODONE-ACETAMINOPHEN 5-325 MG PO TABS: 1.0000 | ORAL_TABLET | ORAL | Status: DC | PRN

## 2013-06-07 MED ORDER — FENTANYL CITRATE 0.05 MG/ML IJ SOLN
INTRAMUSCULAR | Status: AC
  Filled 2013-06-07: qty 5

## 2013-06-07 MED ORDER — HYDROMORPHONE HCL PF 1 MG/ML IJ SOLN: 0.2500 mg | INTRAMUSCULAR | Status: DC | PRN

## 2013-06-07 MED ORDER — LACTATED RINGERS IV SOLN
INTRAVENOUS | Status: DC | PRN
  Administered 2013-06-07 (×2): via INTRAVENOUS

## 2013-06-07 MED ORDER — LIDOCAINE-EPINEPHRINE 1 %-1:100000 IJ SOLN
INTRAMUSCULAR | Status: DC | PRN
  Administered 2013-06-07: 20 mL via INTRADERMAL

## 2013-06-07 MED ORDER — LACTATED RINGERS IV SOLN
INTRAVENOUS | Status: DC
  Administered 2013-06-07: 11:00:00 via INTRAVENOUS

## 2013-06-07 MED ORDER — 0.9 % SODIUM CHLORIDE (POUR BTL) OPTIME
TOPICAL | Status: DC | PRN
  Administered 2013-06-07: 1000 mL

## 2013-06-07 MED ORDER — LIDOCAINE-EPINEPHRINE 1 %-1:100000 IJ SOLN
INTRAMUSCULAR | Status: AC
  Filled 2013-06-07: qty 1

## 2013-06-07 MED ORDER — LIDOCAINE HCL (CARDIAC) 20 MG/ML IV SOLN
INTRAVENOUS | Status: DC | PRN
Start: 2013-06-07 — End: 2013-06-07
  Administered 2013-06-07: 50 mg via INTRAVENOUS

## 2013-06-07 MED ORDER — PROPOFOL 10 MG/ML IV BOLUS
INTRAVENOUS | Status: DC | PRN
  Administered 2013-06-07: 120 mg via INTRAVENOUS

## 2013-06-07 MED ORDER — DEXTROSE 5 % IV SOLN
INTRAVENOUS | Status: DC | PRN
  Administered 2013-06-07: 13:00:00 via INTRAVENOUS

## 2013-06-07 MED ORDER — AMOXICILLIN 875 MG PO TABS: 875.0000 mg | ORAL_TABLET | Freq: Two times a day (BID) | ORAL | Status: DC

## 2013-06-07 MED ORDER — SUCCINYLCHOLINE CHLORIDE 20 MG/ML IJ SOLN
INTRAMUSCULAR | Status: AC
  Filled 2013-06-07: qty 1

## 2013-06-07 MED ORDER — PHENYLEPHRINE 40 MCG/ML (10ML) SYRINGE FOR IV PUSH (FOR BLOOD PRESSURE SUPPORT)
PREFILLED_SYRINGE | INTRAVENOUS | Status: AC
  Filled 2013-06-07: qty 10

## 2013-06-07 MED ORDER — ONDANSETRON HCL 4 MG/2ML IJ SOLN
INTRAMUSCULAR | Status: AC
  Filled 2013-06-07: qty 2

## 2013-06-07 MED ORDER — ONDANSETRON HCL 4 MG/2ML IJ SOLN
INTRAMUSCULAR | Status: DC | PRN
  Administered 2013-06-07: 4 mg via INTRAVENOUS

## 2013-06-07 MED ORDER — PROPOFOL 10 MG/ML IV BOLUS
INTRAVENOUS | Status: AC
  Filled 2013-06-07: qty 20

## 2013-06-07 MED ORDER — CEFAZOLIN SODIUM-DEXTROSE 2-3 GM-% IV SOLR
INTRAVENOUS | Status: DC | PRN
  Administered 2013-06-07: 2 g via INTRAVENOUS

## 2013-06-07 MED ORDER — FENTANYL CITRATE 0.05 MG/ML IJ SOLN
INTRAMUSCULAR | Status: DC | PRN
  Administered 2013-06-07 (×3): 50 ug via INTRAVENOUS

## 2013-06-07 MED ORDER — PHENYLEPHRINE HCL 10 MG/ML IJ SOLN
INTRAMUSCULAR | Status: DC | PRN
  Administered 2013-06-07: 80 ug via INTRAVENOUS

## 2013-06-07 MED ORDER — OXYCODONE HCL 5 MG PO TABS: 5.0000 mg | ORAL_TABLET | Freq: Once | ORAL | Status: DC | PRN

## 2013-06-07 MED ORDER — HEMOSTATIC AGENTS (NO CHARGE) OPTIME
TOPICAL | Status: DC | PRN
  Administered 2013-06-07: 1 via TOPICAL

## 2013-06-07 MED ORDER — LIDOCAINE HCL 4 % MT SOLN
OROMUCOSAL | Status: DC | PRN
  Administered 2013-06-07: 2.5 mL via TOPICAL

## 2013-06-07 MED ORDER — MIDAZOLAM HCL 2 MG/2ML IJ SOLN
INTRAMUSCULAR | Status: AC
  Filled 2013-06-07: qty 2

## 2013-06-07 MED ORDER — LIDOCAINE HCL (CARDIAC) 20 MG/ML IV SOLN
INTRAVENOUS | Status: AC
  Filled 2013-06-07: qty 5

## 2013-06-07 MED ORDER — OXYCODONE HCL 5 MG/5ML PO SOLN: 5.0000 mg | Freq: Once | ORAL | Status: DC | PRN

## 2013-06-07 SURGICAL SUPPLY — 38 items
ADH SKN CLS APL DERMABOND .7 (GAUZE/BANDAGES/DRESSINGS) ×1
BLADE 10 SAFETY STRL DISP (BLADE) ×3 IMPLANT
CANISTER SUCTION 2500CC (MISCELLANEOUS) ×3 IMPLANT
CLEANER TIP ELECTROSURG 2X2 (MISCELLANEOUS) ×3 IMPLANT
COVER SURGICAL LIGHT HANDLE (MISCELLANEOUS) ×3 IMPLANT
DERMABOND ADVANCED (GAUZE/BANDAGES/DRESSINGS) ×2
DERMABOND ADVANCED .7 DNX12 (GAUZE/BANDAGES/DRESSINGS) IMPLANT
ELECT COATED BLADE 2.86 ST (ELECTRODE) ×2 IMPLANT
ELECT NDL BLADE 2-5/6 (NEEDLE) IMPLANT
ELECT NEEDLE BLADE 2-5/6 (NEEDLE) ×3 IMPLANT
ELECT REM PT RETURN 9FT ADLT (ELECTROSURGICAL) ×3
ELECTRODE REM PT RTRN 9FT ADLT (ELECTROSURGICAL) ×1 IMPLANT
GLOVE BIO SURGEON STRL SZ7.5 (GLOVE) ×2 IMPLANT
GLOVE BIOGEL PI IND STRL 6.5 (GLOVE) IMPLANT
GLOVE BIOGEL PI IND STRL 7.0 (GLOVE) IMPLANT
GLOVE BIOGEL PI INDICATOR 6.5 (GLOVE) ×2
GLOVE BIOGEL PI INDICATOR 7.0 (GLOVE) ×2
GLOVE ECLIPSE 7.5 STRL STRAW (GLOVE) ×3 IMPLANT
GLOVE SURG SS PI 7.0 STRL IVOR (GLOVE) ×4 IMPLANT
GOWN STRL REUS W/ TWL LRG LVL3 (GOWN DISPOSABLE) ×2 IMPLANT
GOWN STRL REUS W/TWL LRG LVL3 (GOWN DISPOSABLE) ×6
HEMOSTAT SURGICEL 2X4 FIBR (HEMOSTASIS) ×2 IMPLANT
KIT BASIN OR (CUSTOM PROCEDURE TRAY) ×3 IMPLANT
KIT ROOM TURNOVER OR (KITS) ×3 IMPLANT
NS IRRIG 1000ML POUR BTL (IV SOLUTION) ×3 IMPLANT
PAD ARMBOARD 7.5X6 YLW CONV (MISCELLANEOUS) ×6 IMPLANT
PENCIL BUTTON HOLSTER BLD 10FT (ELECTRODE) ×3 IMPLANT
SPECIMEN JAR SMALL (MISCELLANEOUS) ×3 IMPLANT
SUT SILK 2 0 (SUTURE)
SUT SILK 2-0 18XBRD TIE 12 (SUTURE) IMPLANT
SUT SILK 3 0 (SUTURE) ×3
SUT SILK 3-0 18XBRD TIE 12 (SUTURE) IMPLANT
SUT VIC AB 4-0 SH 27 (SUTURE) ×3
SUT VIC AB 4-0 SH 27XBRD (SUTURE) IMPLANT
TOWEL OR 17X24 6PK STRL BLUE (TOWEL DISPOSABLE) ×3 IMPLANT
TOWEL OR 17X26 10 PK STRL BLUE (TOWEL DISPOSABLE) ×3 IMPLANT
TRAY ENT MC OR (CUSTOM PROCEDURE TRAY) ×3 IMPLANT
WATER STERILE IRR 1000ML POUR (IV SOLUTION) ×3 IMPLANT

## 2013-06-07 NOTE — Anesthesia Preprocedure Evaluation (Addendum)
Anesthesia Evaluation  Patient identified by MRN, date of birth, ID band Patient awake    Reviewed: Allergy & Precautions, H&P , NPO status , Patient's Chart, lab work & pertinent test results, reviewed documented beta blocker date and time   Airway Mallampati: III TM Distance: >3 FB Neck ROM: Full  Mouth opening: Limited Mouth Opening  Dental no notable dental hx. (+) Poor Dentition, Dental Advisory Given, Caps   Pulmonary Current Smoker,  breath sounds clear to auscultation  Pulmonary exam normal       Cardiovascular negative cardio ROS  Rhythm:Regular Rate:Normal     Neuro/Psych negative neurological ROS  negative psych ROS   GI/Hepatic negative GI ROS, Neg liver ROS,   Endo/Other  negative endocrine ROS  Renal/GU negative Renal ROS  negative genitourinary   Musculoskeletal   Abdominal   Peds  Hematology negative hematology ROS (+)   Anesthesia Other Findings 3 unit bridge upper right/front is loose as are 2-3 teeth below. Pt insists that he wear his eyeglasses into the OR and get them back as soon as he wakes up.Marland KitchenMarland KitchenEyeglasses given to circulator, Britt Boozer.  Reproductive/Obstetrics negative OB ROS                         Anesthesia Physical Anesthesia Plan  ASA: II  Anesthesia Plan: General   Post-op Pain Management:    Induction: Intravenous  Airway Management Planned: Oral ETT and Video Laryngoscope Planned  Additional Equipment:   Intra-op Plan:   Post-operative Plan: Extubation in OR  Informed Consent: I have reviewed the patients History and Physical, chart, labs and discussed the procedure including the risks, benefits and alternatives for the proposed anesthesia with the patient or authorized representative who has indicated his/her understanding and acceptance.   Dental advisory given  Plan Discussed with: CRNA  Anesthesia Plan Comments:         Anesthesia  Quick Evaluation

## 2013-06-07 NOTE — Transfer of Care (Signed)
Immediate Anesthesia Transfer of Care Note  Patient: Derek Morgan  Procedure(s) Performed: Procedure(s): EXCISIONAL BIOPSY OF RIGHT NECK MASS (Right)  Patient Location: PACU  Anesthesia Type:General  Level of Consciousness: awake and patient cooperative  Airway & Oxygen Therapy: Patient Spontanous Breathing and Patient connected to nasal cannula oxygen  Post-op Assessment: Report given to PACU RN, Post -op Vital signs reviewed and stable and Patient moving all extremities  Post vital signs: Reviewed and stable  Complications: No apparent anesthesia complications

## 2013-06-07 NOTE — Discharge Instructions (Signed)
Follow up with Dr. Benjamine Mola on 5/ /15 at 1:50pm.  Avoid heavy lifting greater than 20 lbs for 1 week. May resume other activities.  Regular diet as tolerated.

## 2013-06-07 NOTE — Anesthesia Postprocedure Evaluation (Signed)
  Anesthesia Post-op Note  Patient: Derek Morgan  Procedure(s) Performed: Procedure(s): EXCISIONAL BIOPSY OF RIGHT NECK MASS (Right)  Patient Location: PACU  Anesthesia Type:General  Level of Consciousness: awake and alert   Airway and Oxygen Therapy: Patient Spontanous Breathing  Post-op Pain: none  Post-op Assessment: Post-op Vital signs reviewed, Patient's Cardiovascular Status Stable and Respiratory Function Stable  Post-op Vital Signs: Reviewed  Filed Vitals:   06/07/13 1445  BP:   Pulse: 78  Temp: 37 C  Resp: 15    Complications: No apparent anesthesia complications

## 2013-06-07 NOTE — Anesthesia Procedure Notes (Signed)
Procedure Name: Intubation Date/Time: 06/07/2013 1:01 PM Performed by: Williemae Area B Pre-anesthesia Checklist: Patient identified, Emergency Drugs available, Suction available and Patient being monitored Patient Re-evaluated:Patient Re-evaluated prior to inductionOxygen Delivery Method: Circle system utilized Preoxygenation: Pre-oxygenation with 100% oxygen Intubation Type: IV induction Ventilation: Mask ventilation without difficulty Laryngoscope Size: Mac and 4 (video Glidescope) Grade View: Grade II Tube type: Oral Tube size: 7.0 mm Number of attempts: 1 Airway Equipment and Method: Video-laryngoscopy and Stylet Placement Confirmation: ETT inserted through vocal cords under direct vision,  breath sounds checked- equal and bilateral and positive ETCO2 Secured at: 22 (cm at lips) cm Tube secured with: Tape Dental Injury: Teeth and Oropharynx as per pre-operative assessment

## 2013-06-07 NOTE — H&P (Signed)
Cc: Right neck mass  HPI: The patient is a 58 year old male who returns for his follow-up evaluation.  He was last seen 1 week ago.  At that time, he was noted to have a large right Level II neck mass.  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  He underwent FNA biopsy of his right neck mass. The result was inconclusive but worrisome for malignancy. There were markedly atypical cells within the specimen.  He also underwent a neck CT scan.  The CT showed a 6 cm right Level II neck mass, with invasion noted into the right jugular vein.  In addition, an ill-defined mass involving the inferior aspect of the left tonsil and upper aryepiglottic fold was also noted.  The patient returns reporting no significant change in symptoms.  He continues to have significant discomfort around his right neck.  He is still tolerating oral intake well.  The patient is a 40+ pack-year smoker. His recent tonsillectomy and neck biopsy specimens were inconclusive.  Objective General: Communicates with severe hoarseness, thin, no acute distress.   Head: Normocephalic, no evidence injury, no tenderness, facial buttresses intact without stepoff.   Eyes: No scleral icterus, conjunctivae clear.   Neuro: CN II exam reveals vision grossly intact.   No nystagmus at any point of gaze.   Ears: Auricles well formed without lesions.   Ear canals are intact without mass or lesion.   No erythema or edema is appreciated.   The TM is intact without fluid.   Nose: External evaluation reveals normal support and skin without lesions.   Dorsum is intact.   Anterior rhinoscopy reveals healthy pink mucosa over anterior aspect of inferior turbinates and intact septum.   No purulence noted.   Oral:  Oral cavity and oropharynx are intact, symmetric, without erythema or edema.   Mucosa is moist without lesions.   .  Pharynx: Right tonsil healing well.  Pharynx: Erythematous, edematous.   Neck: There is a 6 cm non mobile, firm mass noted located in  level 2.   Neuro:  CN 2-12 grossly intact.   Gait normal.   Vestibular: No nystagmus at any point of gaze.  Assessment 1.  The CT images and the FNA results are worrisome for metastatic squamous cell carcinoma.  Recent biopsy was nondiagnostic. 2.  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   Plan  1.  The FNA results and CT images are reviewed with the patient.  2.  In light of the above findings, we will need to proceed with repeat incisional biopsy of the right neck mass with intraoperative frozen section analysis.  The risks, benefits, alternatives and details of the procedure are reviewed with the patient.   3.  The patient would like to proceed with the procedure.   We will schedule the procedure as soon as possible.

## 2013-06-07 NOTE — Brief Op Note (Signed)
06/07/2013  2:05 PM  PATIENT:  Derek Morgan  58 y.o. male  PRE-OPERATIVE DIAGNOSIS:  RIGHT NECK MASS  POST-OPERATIVE DIAGNOSIS:  RIGHT NECK MASS  PROCEDURE:  Procedure(s): REPEAT INCISIONAL BIOPSY OF RIGHT NECK MASS (Right)  SURGEON:  Surgeon(s) and Role:    * Ascencion Dike, MD - Primary  PHYSICIAN ASSISTANT:   ASSISTANTS: none   ANESTHESIA:   general  EBL:  Total I/O In: 1050 [I.V.:1050] Out: -   BLOOD ADMINISTERED:none  DRAINS: none   LOCAL MEDICATIONS USED:  LIDOCAINE   SPECIMEN:  Source of Specimen:  Right neck mass  DISPOSITION OF SPECIMEN:  PATHOLOGY  COUNTS:  YES  TOURNIQUET:  * No tourniquets in log *  DICTATION: .Other Dictation: Dictation Number [id number]  PLAN OF CARE: Discharge to home after PACU  PATIENT DISPOSITION:  PACU - hemodynamically stable.   Delay start of Pharmacological VTE agent (>24hrs) due to surgical blood loss or risk of bleeding: not applicable

## 2013-06-07 NOTE — Progress Notes (Signed)
Eye glasses were worn to OR by pt. Glasses were taken from patient and placed in clear bag labeled with patient's name. Once surgery was completed and patient was awake, glasses were given to patient and sent to PACU

## 2013-06-08 NOTE — Op Note (Signed)
Derek Morgan, Derek Morgan               ACCOUNT NO.:  [id number]  MEDICAL RECORD NO.:  [id number]  LOCATION:  [facility]                         FACILITY:  [facility]  PHYSICIAN:  Leta Baptist, MD            DATE OF BIRTH:  May 10, 1955  DATE OF PROCEDURE:  06/07/2013 DATE OF DISCHARGE:  06/07/2013                              OPERATIVE REPORT   SURGEON:  Leta Baptist, MD  PREOPERATIVE DIAGNOSES: 1. Right neck mass. 2. Right vocal cord paralysis.  POSTOPERATIVE DIAGNOSES: 1. Right metastatic squamous cell carcinoma. 2. Right vocal cord paralysis.  PROCEDURE PERFORMED:  Incisional biopsy of deep right neck mass.  ANESTHESIA:  General endotracheal tube anesthesia.  COMPLICATIONS:  None.  ESTIMATED BLOOD LOSS:  Minimal.  INDICATION FOR THE PROCEDURE:  The patient is a 58 year old male with a history of an enlarging right neck mass, hoarseness, and throat pain. He was previously noted to have right vocal cord paralysis.  He underwent a CT scan, which showed large 6-cm right neck mass.  Mass was also noted at right inferior tonsillar fossa.  He underwent right tonsillectomy surgery and incisional biopsy of the right neck mass. However, no obvious malignancy was noted in either specimen.  As a result, the decision was made for the patient to undergo a repeat biopsy of the right neck mass, with intraoperative frozen section analysis. The risks, benefits, and details of the procedure were discussed with the patient.  Questions were invited and answered.  Informed consent was obtained.  DESCRIPTION OF PROCEDURE:  The patient was taken to the operating room and placed supine on the operating table.  General endotracheal tube anesthesia was administered by the anesthesiologist.  Preop IV antibiotics were given.  The patient was positioned and prepped and draped in a standard fashion for right neck surgery.  A 1% lidocaine with 1:100,000 epinephrine was injected at the planned site of incisions.  A  curvilinear transverse incision was made over the right lateral neck, overlying the large right neck mass.  The incision was carried down to the level of the platysma muscles.  Superiorly and inferiorly based subplatysmal flaps were elevated in a standard fashion. The sternocleidomastoid muscle was identified.  The sternocleidomastoid muscle was carefully retracted laterally.  Large solid mass was noted. The multiple large biopsy specimens were obtained.  The specimens were sent to the Pathology Department for frozen section analysis.  The frozen section was consistent with squamous cell carcinoma.  The surgical site was copiously irrigated.  Hemostasis was achieved with Bovie electrocautery.  Surgicel was also used to achieve hemostasis. The surgical site was irrigated.  The incision was closed in layers with 4-0 Vicryl sutures and Dermabond.  The care of the patient was turned over to the anesthesiologist.  The patient was awakened from anesthesia without difficulty.  He was extubated and transferred to the recovery room in good condition.  OPERATIVE FINDINGS:  A 6-cm right neck mass is noted.  Frozen section analysis of the biopsy specimen was consistent with squamous cell carcinoma.  FOLLOWUP CARE:  The patient will be discharged home once he is awake and alert.  He will be placed on Percocet p.r.n.  pain and amoxicillin 875 mg p.o. b.i.d. for 5 days.  The patient will follow up in my office in 1 week.     Leta Baptist, MD     ST/MEDQ  D:  06/07/2013  T:  06/08/2013  Job:  [id number]

## 2013-06-11 ENCOUNTER — Encounter (HOSPITAL_COMMUNITY): Payer: Self-pay | Admitting: Otolaryngology

## 2013-06-13 ENCOUNTER — Telehealth: Payer: Self-pay | Admitting: Hematology and Oncology

## 2013-06-13 NOTE — Progress Notes (Addendum)
Head and Neck Cancer Location of Tumor / Histology: Metastatic Squamous Cell Carcinoma - Right Neck mass  Patient presented two week history of sore throat and hoarseness. His neck has been swollen on the right side for about the past month. He's had a dry, nonproductive cough. He denies any fever, chills, headache, or hemoptysis. He has a large, rockhard mass of lymphadenopathy in the right anterior cervical area which was nontender to touch. Large right Level II neck mass with right vocal cord paralysis: He underwent FNA biopsy of his right neck mass. The result was inconclusive but worrisome for malignancy. There were markedly atypical cells within the specimen. He also underwent a neck CT scan. The CT showed a 6 cm right Level II neck mass, with invasion noted into the right jugular vein. In addition, an ill-defined mass involving the inferior aspect of the left tonsil and upper aryepiglottic fold was also noted.   Biopsies of "Deep Right Neck Mass"  (if applicable) revealed: 9/62/95 Soft tissue mass, biopsy, Right neck - METASTATIC SQUAMOUS CELL CARCINOMA   Right vocal cord paralysis  05/22/13 Diagnosis 1. Tonsil, Right - BENIGN LYMPHOID HYPERPLASIA. - CALCULUS. - THERE IS NO EVIDENCE OF MALIGNANCY. 2. Soft tissue mass, simple excision, Right neck - MILDLY INFLAMED SKELETAL MUSCLE, ADIPOSE TISSUE, AND FIBROUS TISSUE. - THERE IS NO EVIDENCE OF MALIGNANCY    Nutrition Status:  Weight changes: lost 10 lbs since the beginning of May  Swallowing status: eating soft foods, says has a little trouble swallowing  Plans, if any, for PEG tube: waiting for PET scan on 06/28/13  Tobacco/Marijuana/Snuff/ETOH use: The patient is a 40+ pack-year smoker.  Currently smokes 3 cigarettes per day, using e cigarettes.  Drinks about 6 beers per week, mostly on the weekends.   Past/Anticipated interventions by otolaryngology, if any: 05/22/2013 - Procedure: DIRECT LARYNGOSCOPY;  Surgeon: Ascencion Dike, MD;  Procedure: RIGHT TONSILLECTOMY;  Surgeon: Ascencion Dike, MD;; Procedure:  INCISIONAL BIOPSY NECK MASS;  Surgeon: Ascencion Dike, MD;  06/07/2013 - Procedure: EXCISIONAL BIOPSY OF RIGHT NECK MASS;  Surgeon: Ascencion Dike, MD;  Location: [facility];  Service: ENT;  Laterality: Right;  Past/Anticipated interventions by medical oncology, if any: Appointment on 06/17/13 with Dr. Heath Lark  Referrals yet, to any of the following?  Social Work? During [facility] 06/19/13  Dentistry? 06/20/13  Swallowing therapy? 06/26/13  Nutrition? During [facility] 06/29/13  Med/Onc? During [facility] 06/29/13  PEG placement? Waiting for PET scan results.  SAFETY ISSUES:  Prior radiation? NO  Pacemaker/ICD? no  Possible current pregnancy? N/A  Is the patient on methotrexate? No  Current Complaints / other details: Currently taking Morphine 15 mg, usually twice a day and oxycodone/acetinomphen 1 tablet daily before work.  He has pain due to sore throat and pain in his right jaw/neck.  His bp was elevated at 166/94 and 171/96 when rechecked.  He reports that it goes up in doctor's offices.  Reports a nonproductive cough.

## 2013-06-13 NOTE — Telephone Encounter (Signed)
S/W PATIENT AND GAVE NP APPT FOR 06/01 @ 2 W/DR. [place].  [place] DX- TONSIL CA  WELCOME PACKET MAILED.

## 2013-06-14 ENCOUNTER — Encounter: Payer: Self-pay | Admitting: Radiation Oncology

## 2013-06-14 ENCOUNTER — Telehealth: Payer: Self-pay | Admitting: Hematology and Oncology

## 2013-06-14 NOTE — Telephone Encounter (Signed)
C/D 06/14/13 for appt. 06/19/13 °

## 2013-06-17 ENCOUNTER — Ambulatory Visit (HOSPITAL_BASED_OUTPATIENT_CLINIC_OR_DEPARTMENT_OTHER): Payer: BC Managed Care – PPO | Admitting: Hematology and Oncology

## 2013-06-17 ENCOUNTER — Encounter: Payer: Self-pay | Admitting: Hematology and Oncology

## 2013-06-17 ENCOUNTER — Ambulatory Visit: Payer: BC Managed Care – PPO

## 2013-06-17 ENCOUNTER — Telehealth: Payer: Self-pay | Admitting: Hematology and Oncology

## 2013-06-17 VITALS — BP 140/82 | HR 117 | Temp 98.1°F | Resp 20 | Ht 73.0 in | Wt 121.3 lb

## 2013-06-17 DIAGNOSIS — C329 Malignant neoplasm of larynx, unspecified: Secondary | ICD-10-CM

## 2013-06-17 DIAGNOSIS — C109 Malignant neoplasm of oropharynx, unspecified: Secondary | ICD-10-CM

## 2013-06-17 DIAGNOSIS — K089 Disorder of teeth and supporting structures, unspecified: Secondary | ICD-10-CM | POA: Insufficient documentation

## 2013-06-17 DIAGNOSIS — Z720 Tobacco use: Secondary | ICD-10-CM

## 2013-06-17 DIAGNOSIS — C779 Secondary and unspecified malignant neoplasm of lymph node, unspecified: Secondary | ICD-10-CM

## 2013-06-17 DIAGNOSIS — R070 Pain in throat: Secondary | ICD-10-CM

## 2013-06-17 DIAGNOSIS — R634 Abnormal weight loss: Secondary | ICD-10-CM

## 2013-06-17 DIAGNOSIS — R1310 Dysphagia, unspecified: Secondary | ICD-10-CM

## 2013-06-17 HISTORY — DX: Abnormal weight loss: R63.4

## 2013-06-17 HISTORY — DX: Pain in throat: R07.0

## 2013-06-17 MED ORDER — DOCUSATE SODIUM 100 MG PO CAPS: 100.0000 mg | ORAL_CAPSULE | Freq: Two times a day (BID) | ORAL | Status: AC

## 2013-06-17 MED ORDER — MORPHINE SULFATE 15 MG PO TABS: 15.0000 mg | ORAL_TABLET | ORAL | Status: DC | PRN

## 2013-06-17 NOTE — Assessment & Plan Note (Signed)
I spent some time counseling the patient the importance of tobacco cessation. he is currently attempting to quit on his own 

## 2013-06-17 NOTE — Progress Notes (Signed)
Checked in new pt with no financial concerns. °

## 2013-06-17 NOTE — Progress Notes (Signed)
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 male is here because of newly diagnosed squamous cell carcinoma of the oropharynx According to the patient, the first initial presentation was due to new palpable lump on the right side of the neck, starting around September of 2014. It resolved spontaneously. The patient thought that was related to dental abscess. Starting around December of 2014, it recurred and did not resolve. Setting from March of this year, his started to have sensation of sore throat and hoarseness of his voice. He also has mild dysphagia with sensation of food getting stuck in his throat. He denies anorexia but has lost 10 pounds of weight. 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 Since his recent diagnosis, his hearing on the right side has gotten progressively worse. He complained of severe sores throat his recent surgery, rating his pain is 8/10. He is using the prescribed pain medicines every 4 hours little pain relief. he denies any difficulties with chewing food. I reviewed his records extensively and summarized as follows  Oncology History   Oropharyngeal cancer, HPV negative   Primary site: Pharynx - Oropharynx   Staging method: AJCC 7th Edition   Clinical: (T4b, N3)   Summary: (T4b, N3)        Oropharyngeal cancer   05/06/2013 Imaging 4.2 x 4.7 x 6.1 cm heterogeneous right neck mass in the right level 2 & level 3 stations. It invades the carotid space, obstructing the internal jugular vein. The primary lesion may be along the inferior aspect of the right palatine tonsil    05/22/2013 Procedure Laryngoscopy revealed abnormalities beneath the right tonsil area.  Unfortunately tonsillectomy and right neck biopsy came back benign tissue.   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    MEDICAL HISTORY:  Past Medical History  Diagnosis Date  . Heavy smoker   . Hoarseness of voice   . Mass in neck 04/2013    right/ Metastatic Squamous Cell Carcinoma  . Loose, teeth     top and bottom  . Arthritis   . Weight loss 06/17/2013  . Throat pain 06/17/2013    SURGICAL HISTORY: Past Surgical History  Procedure Laterality Date  . Broken leg  1998    orif ankle-left-plated  . Direct laryngoscopy N/A 05/22/2013    Procedure: DIRECT LARYNGOSCOPY;  Surgeon: Ascencion Dike, MD;  Location: [facility];  Service: ENT;  Laterality: N/A;  . Tonsillectomy Right 05/22/2013    Procedure: RIGHT TONSILLECTOMY;  Surgeon: Ascencion Dike, MD;  Location: [facility];  Service: ENT;  Laterality: Right;  . Mass biopsy Right 05/22/2013    Procedure:  INCISIONAL BIOPSY NECK MASS;  Surgeon: Ascencion Dike, MD;  Location: [facility];  Service: ENT;  Laterality: Right;  . Mass biopsy Right 06/07/2013    Procedure: EXCISIONAL BIOPSY OF RIGHT NECK MASS;  Surgeon: Ascencion Dike, MD;  Location: [facility];  Service: ENT;  Laterality: Right;    SOCIAL HISTORY: History   Social History  . Marital Status: Single    Spouse Name: N/A    Number of Children: N/A  . Years of Education: N/A   Occupational History  . Not on file.  Social History Main Topics  . Smoking status: Current Every Day Smoker -- 0.25 packs/day for 40 years  . Smokeless tobacco: Current User  . Alcohol Use: 3.6 oz/week    6 Cans of beer per week     Comment: occ  . Drug Use: No  . Sexual Activity: Not on file   Other Topics Concern  . Not on file   Social History Narrative  . No narrative on file    FAMILY HISTORY: Family History  Problem Relation Age of Onset  . CVA Mother   . CAD Father   . Cancer Father     lung ca    ALLERGIES:  has No Known Allergies.  MEDICATIONS:  Current Outpatient  Prescriptions  Medication Sig Dispense Refill  . oxyCODONE-acetaminophen (ROXICET) 5-325 MG per tablet Take 1-2 tablets by mouth every 4 (four) hours as needed for moderate pain or severe pain.  30 tablet  0  . docusate sodium (COLACE) 100 MG capsule Take 1 capsule (100 mg total) by mouth 2 (two) times daily.  60 capsule  0  . morphine (MSIR) 15 MG tablet Take 1 tablet (15 mg total) by mouth every 4 (four) hours as needed for severe pain.  60 tablet  0   No current facility-administered medications for this visit.    REVIEW OF SYSTEMS:   Constitutional: Denies fevers, chills or abnormal night sweats Eyes: Denies blurriness of vision, double vision or watery eyes Respiratory: Denies cough, dyspnea or wheezes Cardiovascular: Denies palpitation, chest discomfort or lower extremity swelling Gastrointestinal:  Denies nausea, heartburn or change in bowel habits Skin: Denies abnormal skin rashes Lymphatics: Denies new lymphadenopathy or easy bruising Neurological:Denies numbness, tingling or new weaknesses Behavioral/Psych: Mood is stable, no new changes  All other systems were reviewed with the patient and are negative.  PHYSICAL EXAMINATION: ECOG PERFORMANCE STATUS: 1 - Symptomatic but completely ambulatory  Filed Vitals:   06/17/13 1354  BP: 140/82  Pulse: 117  Temp: 98.1 F (36.7 C)  Resp: 20   Filed Weights   06/17/13 1354  Weight: 121 lb 4.8 oz (55.021 kg)    GENERAL:alert, no distress and comfortable looks thin and mildly cachectic SKIN: skin color, texture, turgor are normal, no rashes or significant lesions EYES: normal, conjunctiva are pink and non-injected, sclera clear OROPHARYNX:no exudate, no erythema and lips, with a fullness on the posterior part of the right tongue  NECK: Significant swelling on the right of the neck with well healed surgical scar. LYMPH:  Difficult palpable lymphadenopathy in the right of his neck. LUNGS: clear to auscultation and percussion with  normal breathing effort HEART: regular rate & rhythm and no murmurs and no lower extremity edema ABDOMEN:abdomen soft, non-tender and normal bowel sounds Musculoskeletal:no cyanosis of digits and no clubbing  PSYCH: alert & oriented x 3 with fluent speech but noticed significant hoarseness of voice NEURO: no focal motor/sensory deficits  LABORATORY DATA:  I have reviewed the data as listed Lab Results  Component Value Date   WBC 12.1* 06/07/2013   HGB 16.3 06/07/2013   HCT 47.7 06/07/2013   MCV 99.6 06/07/2013   PLT 344 06/07/2013   No results found for this basename: NA, K, CL, CO2    RADIOGRAPHIC STUDIES: I reviewed the imaging study with the patient I have personally reviewed the radiological images as listed and agreed with the findings in the report.  ASSESSMENT:  Newly diagnosed squamous cell carcinoma of the Head & Neck, HPV Negative  PLAN:  Oropharyngeal cancer Stage of the disease is to be determined, a PET/CT scan has been ordered.   Prognosis would depend on the results for the PET/CT scan, to be discussed and reviewed in the next visit.   Treatment options would include chemotherapy only, radiation only or chemotherapy in combination with radiation therapy.    In preparation for treatment, the patient will need the following tests or referrals, to be arranged #1 Referral to Radiation Oncology for consultation.  #2 Referral to dentist for full dental evaluation and possible dental extraction  #3 PET/CT scan.  #4 Referral for feeding tube placement.  #5 Infusaport placement #6 Referral to Speech Pathologist  #7 Referral to Nutritionist  #8 Referral to chemotherapy class to learn about practical tips while on treatment.   We will review his case at the ENT tumor board in 2 days time.       Poor dentition I will refer him to see the dentist for full dental extraction prior to the start of treatment.  Throat pain I prescribed pain narcotics pain medication with  morphine. We discussed about the role of pain management in the oncology clinic. The prescribed pain regimen is for short term use only.  The patient agreed to be compliant with prescribed pain regimen and promised not to share the prescribed medications. Any lost medications or missed prescription will not be refilled sooner than anticipated time when the patient's prescription is expected to run out. We also discussed narcotics refill policy in the clinic.  The patient is educated to check the pill bottles in the middle of the week and ensure there is adequate supply to last through the weekend until next appointment or available business day.  The oncology service has a strict policy not to refill pain medications after business hours or the weekend.  I will reassess his pain control in 2 days time.   Tobacco abuse I spent some time counseling the patient the importance of tobacco cessation. he is currently attempting to quit on his own   Weight loss I have referred him to see our nutritionist.     Orders Placed This Encounter  Procedures  . NM PET Image Initial (PI) Skull Base To Thigh    Standing Status: Future     Number of Occurrences:      Standing Expiration Date: 06/17/2014    Order Specific Question:  Reason for Exam (SYMPTOM  OR DIAGNOSIS REQUIRED)    Answer:  staging laryngeal ca    Order Specific Question:  Preferred imaging location?    Answer:  [facility]  . Ambulatory Referral to General Surgery for Portacath & Open Gastrostomy Tube    Referral Priority:  Routine    Referral Type:  Surgical    Referral Reason:  Specialty Services Required    Referred to Provider:  Merrie Roof, MD    Requested Specialty:  General Surgery    Number of Visits Requested:  1  . Ambulatory Referral to Speech Therapy  (specifically to Garald Balding)    Referral Priority:  Routine    Referral Type:  Speech Therapy    Referral Reason:  Specialty Services Required    Requested  Specialty:  Speech Pathology    Number of Visits Requested:  1  . Ambulatory Referral to Dentistry (specifically to Dr. Enrique Sack)    Referral Priority:  Routine    Referral Type:  Consultation    Referral Reason:  Specialty Services Required    Requested  Specialty:  Dental General Practice    Number of Visits Requested:  1  . Amb Referral to Nutrition and Diabetic Education (specifically to Ernestene Kiel)    Referral Priority:  Routine    Referral Type:  Consultation    Referral Reason:  Specialty Services Required    Number of Visits Requested:  1  . Ambulatory Referral to Social Work    Referral Priority:  Routine    Referral Type:  Consultation    Referral Reason:  Specialty Services Required    Number of Visits Requested:  1    All questions were answered. The patient knows to call the clinic with any problems, questions or concerns.    Heath Lark, MD 06/17/2013 10:53 PM

## 2013-06-17 NOTE — Assessment & Plan Note (Signed)
I have referred him to see our nutritionist.

## 2013-06-17 NOTE — Assessment & Plan Note (Signed)
Stage of the disease is to be determined, a PET/CT scan has been ordered.   Prognosis would depend on the results for the PET/CT scan, to be discussed and reviewed in the next visit.   Treatment options would include chemotherapy only, radiation only or chemotherapy in combination with radiation therapy.    In preparation for treatment, the patient will need the following tests or referrals, to be arranged #1 Referral to Radiation Oncology for consultation.  #2 Referral to dentist for full dental evaluation and possible dental extraction  #3 PET/CT scan.  #4 Referral for feeding tube placement.  #5 Infusaport placement #6 Referral to Speech Pathologist  #7 Referral to Nutritionist  #8 Referral to chemotherapy class to learn about practical tips while on treatment.   We will review his case at the ENT tumor board in 2 days time.

## 2013-06-17 NOTE — Telephone Encounter (Signed)
gv adn printed pt appt sched for June....pt sched to see Dr. Valentino Saxon on 6:10 @ 2pm///lvm for Dr. Arlana Lindau for social worker lauren////pt sched to see Dr. Marlou Starks on 6.16 @ 10:10am.////pt will see nut in head & neck click per Liliane Channel

## 2013-06-17 NOTE — Assessment & Plan Note (Signed)
I prescribed pain narcotics pain medication with morphine. We discussed about the role of pain management in the oncology clinic. The prescribed pain regimen is for short term use only.  The patient agreed to be compliant with prescribed pain regimen and promised not to share the prescribed medications. Any lost medications or missed prescription will not be refilled sooner than anticipated time when the patient's prescription is expected to run out. We also discussed narcotics refill policy in the clinic.  The patient is educated to check the pill bottles in the middle of the week and ensure there is adequate supply to last through the weekend until next appointment or available business day.  The oncology service has a strict policy not to refill pain medications after business hours or the weekend.  I will reassess his pain control in 2 days time.

## 2013-06-17 NOTE — Progress Notes (Signed)
Met with patient during initial consult with Dr. Alvy Bimler, reintroduced my self as his navigator, explained my role as a member of the Care Team, provided contact information, encouraged him to contact me with questions/concerns as treatments/procedures begin, explained arrival procedure for appts at [facility].  He indicated understanding.    Afterwards, facilitated scheduling of appts, showed him location of Dr. Ritta Slot office and Radiology and arrival procedure.   I reviewed with him the format of this Wednesday's H&N [place].  Initiating navigation as L1 patient (new) with this encounter.  Gayleen Orem, RN, BSN, [facility] Head & Neck Oncology Navigator [phone]

## 2013-06-17 NOTE — Assessment & Plan Note (Signed)
I will refer him to see the dentist for full dental extraction prior to the start of treatment.

## 2013-06-18 ENCOUNTER — Telehealth: Payer: Self-pay | Admitting: *Deleted

## 2013-06-18 NOTE — Telephone Encounter (Signed)
Called patient to answer any additional questions prior to his attendance at the H&N [facility] [facility] tomorrow.  He said he did not.  He stated he has a 10:00 Chemo Ed class and will be here for a 12:15 clinic arrival.    Gayleen Orem, RN, BSN, [place] Neck Oncology Navigator [phone]

## 2013-06-19 ENCOUNTER — Encounter: Payer: Self-pay | Admitting: Radiation Oncology

## 2013-06-19 ENCOUNTER — Ambulatory Visit: Payer: BC Managed Care – PPO | Admitting: Nutrition

## 2013-06-19 ENCOUNTER — Ambulatory Visit
Admission: RE | Admit: 2013-06-19 | Discharge: 2013-06-19 | Disposition: A | Discharge status: Home / Self Care | Payer: BC Managed Care – PPO | Source: Ambulatory Visit | Attending: Radiation Oncology | Admitting: Radiation Oncology

## 2013-06-19 ENCOUNTER — Encounter: Payer: Self-pay | Admitting: Hematology and Oncology

## 2013-06-19 ENCOUNTER — Other Ambulatory Visit: Payer: BC Managed Care – PPO

## 2013-06-19 ENCOUNTER — Ambulatory Visit (HOSPITAL_BASED_OUTPATIENT_CLINIC_OR_DEPARTMENT_OTHER): Payer: BC Managed Care – PPO | Admitting: Hematology and Oncology

## 2013-06-19 ENCOUNTER — Encounter: Payer: Self-pay | Admitting: *Deleted

## 2013-06-19 ENCOUNTER — Ambulatory Visit: Payer: BC Managed Care – PPO | Attending: Radiation Oncology | Admitting: Physical Therapy

## 2013-06-19 VITALS — BP 171/96 | HR 79 | Temp 98.1°F | Resp 16 | Ht 73.0 in | Wt 122.1 lb

## 2013-06-19 VITALS — BP 164/90 | HR 100 | Temp 97.7°F | Resp 18 | Ht 73.0 in | Wt 122.0 lb

## 2013-06-19 DIAGNOSIS — C01 Malignant neoplasm of base of tongue: Secondary | ICD-10-CM

## 2013-06-19 DIAGNOSIS — Z5189 Encounter for other specified aftercare: Secondary | ICD-10-CM | POA: Insufficient documentation

## 2013-06-19 DIAGNOSIS — R070 Pain in throat: Secondary | ICD-10-CM

## 2013-06-19 DIAGNOSIS — C779 Secondary and unspecified malignant neoplasm of lymph node, unspecified: Secondary | ICD-10-CM

## 2013-06-19 DIAGNOSIS — F17200 Nicotine dependence, unspecified, uncomplicated: Secondary | ICD-10-CM | POA: Insufficient documentation

## 2013-06-19 DIAGNOSIS — C099 Malignant neoplasm of tonsil, unspecified: Secondary | ICD-10-CM

## 2013-06-19 DIAGNOSIS — R1311 Dysphagia, oral phase: Secondary | ICD-10-CM | POA: Insufficient documentation

## 2013-06-19 DIAGNOSIS — R293 Abnormal posture: Secondary | ICD-10-CM | POA: Insufficient documentation

## 2013-06-19 DIAGNOSIS — C4442 Squamous cell carcinoma of skin of scalp and neck: Secondary | ICD-10-CM | POA: Insufficient documentation

## 2013-06-19 DIAGNOSIS — C109 Malignant neoplasm of oropharynx, unspecified: Secondary | ICD-10-CM

## 2013-06-19 DIAGNOSIS — Z720 Tobacco use: Secondary | ICD-10-CM

## 2013-06-19 DIAGNOSIS — B370 Candidal stomatitis: Secondary | ICD-10-CM | POA: Insufficient documentation

## 2013-06-19 DIAGNOSIS — Z809 Family history of malignant neoplasm, unspecified: Secondary | ICD-10-CM | POA: Insufficient documentation

## 2013-06-19 DIAGNOSIS — F1010 Alcohol abuse, uncomplicated: Secondary | ICD-10-CM | POA: Insufficient documentation

## 2013-06-19 DIAGNOSIS — Z79899 Other long term (current) drug therapy: Secondary | ICD-10-CM | POA: Insufficient documentation

## 2013-06-19 MED ORDER — FLUCONAZOLE 100 MG PO TABS: ORAL_TABLET | ORAL | Status: DC

## 2013-06-19 MED ORDER — LARYNGOSCOPY SOLUTION RAD-ONC
15.0000 mL | Freq: Once | TOPICAL | Status: AC
  Administered 2013-06-19: 15 mL via TOPICAL
  Filled 2013-06-19: qty 15

## 2013-06-19 NOTE — Assessment & Plan Note (Signed)
I spent some time counseling the patient the importance of tobacco cessation. he is currently attempting to quit on his own 

## 2013-06-19 NOTE — Assessment & Plan Note (Signed)
Previously farmer from our visit, I have started him on immediate release morphine. His pain is under much better controlled. He has only needed to use the prescription medicine twice a day with good pain relief. I will continue to reassess his pain management in the next visit.

## 2013-06-19 NOTE — Progress Notes (Signed)
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  Pt seen in clinic today.   PET scheduled for 05/28/13.   HT 73" Wt 121.1 lbs UBW 130 lbs BMI 15.96  Pt reports decreased appetite and po intake, due to trouble swallowing.  Wt down 9 lbs from UBW.  Pt is drinking 2 bottles of Boost/day, but po intake appears inadequate.   Nutrition Dx:  Food and Nutrition related knowledge deficit related to new Dx of Tonsil CA and associated tx as evidenced by no prior need for nutrition related information.  Inadequate oral intake related to dysphagia as evidenced by 9 lbs wt loss for UBW.    Intervention:  Pt was educated on the importance of good nutrition and wt maintenance during tx.  Encouraged 6 small high kcal/high protein meals/snacks with supplements.  Reviewed tips on coping with sore throat and ways to increase kcals and protein with beverages.  Coupons for Boost plus and Ensure plus provided with recipes for shakes and smoothies. Recommend 3-4 Boost plus/day or other shakes or smoothies.  Recipes provided.  Pt  seemed to have a good understanding of the information and seemed motivated to follow recommendations. Also discussed PEG tube and benefits, if needed, pt open to PEG to help prevent malnutrition, will discuss with MD.   Monitoring, evaluation, goals:  Pt will tolerate adequate kcal and protein to promote wt maintenance/gain and healing.   Next visit:  To be scheduled.

## 2013-06-19 NOTE — Progress Notes (Signed)
Head & Neck Multidisciplinary Clinic Clinical Social Work  Clinical Social Work met with patient at head & neck multidisciplinary clinic to offer support and assess for psychosocial needs.  [name] reports feeling optimistic about current treatment plan is "ready to deal with it".  The patient reports his support system primarily consists of his neighbor and coworkers.  The patient had minimal questions at this time.  CSW and patient discussed completing advance directives- CSW encouraged patient to contact CSW when he is ready to complete.  CSW reviewed possible resources such as transportation services and connecting patient with a Product manager through patient navigator.  Clinical Social Work briefly discussed Clinical Social Work role and [employer] support programs/services.  Clinical Social Work encouraged patient to call with any additional questions or concerns.  ONCBCN DISTRESS SCREENING 06/19/2013  Screening Type Initial Screening  Jaziah the number that describes how much distress you have been experiencing in the past week 3  Emotional problem type Adjusting to illness  Physical Problem type Mouth sores/swallowing  Physician notified of physical symptoms Yes    Polo Riley, MSW, LCSW, OSW-C Clinical Social Worker [place] [phone]
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
## 2013-06-19 NOTE — Assessment & Plan Note (Signed)
I presented his case at the ENT tumor board today. Plan would be for multidisciplinary approach to this problem. If his disease remains localized in the head and neck region, the patient would be an excellent candidate for concurrent chemoradiation therapy. I will see him back in 10 days time to review results of the PET/CT scan.

## 2013-06-19 NOTE — Progress Notes (Signed)
[place] OFFICE PROGRESS NOTE  Patient Care Team: No Pcp Per Patient as PCP - General (General Practice) Heath Lark, MD as Consulting Physician (Hematology and Oncology) Ascencion Dike, MD as Consulting Physician (Otolaryngology) Brooks Sailors, RN as Oncology Nurse Navigator (Oncology)  SUMMARY OF ONCOLOGIC HISTORY: Oncology History   Oropharyngeal cancer, HPV negative   Primary site: Pharynx - Oropharynx   Staging method: AJCC 7th Edition   Clinical: (T4b, N3)   Summary: (T4b, N3)        Oropharyngeal cancer   05/06/2013 Imaging 4.2 x 4.7 x 6.1 cm heterogeneous right neck mass in the right level 2 & level 3 stations. It invades the carotid space, obstructing the internal jugular vein. The primary lesion may be along the inferior aspect of the right palatine tonsil    05/22/2013 Procedure Laryngoscopy revealed abnormalities beneath the right tonsil area. Unfortunately tonsillectomy and right neck biopsy came back benign tissue.   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    INTERVAL HISTORY: Please see below for problem oriented charting. His pain control is much improved.  REVIEW OF SYSTEMS:   All other systems were reviewed with the patient and are negative.  I have reviewed the past medical history, past surgical history, social history and family history with the patient and they are unchanged from previous note.  ALLERGIES:  has No Known Allergies.  MEDICATIONS:  Current Outpatient Prescriptions  Medication Sig Dispense Refill  . docusate sodium (COLACE) 100 MG capsule Take 1 capsule (100 mg total) by mouth 2 (two) times daily.  60 capsule  0  . morphine (MSIR) 15 MG tablet Take 1 tablet (15 mg total) by mouth every 4 (four) hours as needed for severe pain.  60 tablet  0  . oxyCODONE-acetaminophen (ROXICET) 5-325 MG per tablet Take 1-2 tablets by mouth every 4 (four) hours as needed for moderate pain or severe pain.   30 tablet  0  . fluconazole (DIFLUCAN) 100 MG tablet Take 2 tabs today, then 1 tab daily x 20 days for yeast in throat  22 tablet  0   No current facility-administered medications for this visit.    PHYSICAL EXAMINATION: ECOG PERFORMANCE STATUS: 1 - Symptomatic but completely ambulatory  Filed Vitals:   06/19/13 1107  BP: 164/90  Pulse: 100  Temp: 97.7 F (36.5 C)  Resp: 18   Filed Weights   06/19/13 1107  Weight: 122 lb (55.339 kg)    GENERAL:alert, no distress and comfortable SKIN: skin color, texture, turgor are normal, no rashes or significant lesions NEURO: alert & oriented x 3 with fluent speech, no focal motor/sensory deficits  LABORATORY DATA:  I have reviewed the data as listed No results found for this basename: na, k, cl, co2, glucose, bun, creatinine, calcium, prot, albumin, ast, alt, alkphos, bilitot, gfrnonaa, gfraa    No results found for this basename: SPEP, UPEP,  kappa and lambda light chains    Lab Results  Component Value Date   WBC 12.1* 06/07/2013   HGB 16.3 06/07/2013   HCT 47.7 06/07/2013   MCV 99.6 06/07/2013   PLT 344 06/07/2013      Chemistry   No results found for this basename: NA, K, CL, CO2, BUN, CREATININE, GLU   No results found for this basename: CALCIUM, ALKPHOS, AST, ALT, BILITOT     ASSESSMENT & PLAN:  Throat pain Previously farmer from our visit, I have started him on immediate release morphine.  His pain is under much better controlled. He has only needed to use the prescription medicine twice a day with good pain relief. I will continue to reassess his pain management in the next visit.  Oropharyngeal cancer I presented his case at the ENT tumor board today. Plan would be for multidisciplinary approach to this problem. If his disease remains localized in the head and neck region, the patient would be an excellent candidate for concurrent chemoradiation therapy. I will see him back in 10 days time to review results of the  PET/CT scan.  Tobacco abuse I spent some time counseling the patient the importance of tobacco cessation. he is currently attempting to quit on his own    No orders of the defined types were placed in this encounter.   All questions were answered. The patient knows to call the clinic with any problems, questions or concerns. No barriers to learning was detected. I spent 15 minutes counseling the patient face to face. The total time spent in the appointment was 20 minutes and more than 50% was on counseling and review of test results     Heath Lark, MD 06/19/2013 3:11 PM

## 2013-06-20 ENCOUNTER — Ambulatory Visit (HOSPITAL_COMMUNITY): Payer: Self-pay | Admitting: Dentistry

## 2013-06-20 ENCOUNTER — Other Ambulatory Visit (HOSPITAL_COMMUNITY): Payer: Self-pay | Admitting: Dentistry

## 2013-06-20 ENCOUNTER — Encounter (HOSPITAL_COMMUNITY): Payer: Self-pay | Admitting: Pharmacy Technician

## 2013-06-20 ENCOUNTER — Encounter (HOSPITAL_COMMUNITY): Payer: Self-pay | Admitting: Dentistry

## 2013-06-20 VITALS — BP 152/94 | HR 94 | Temp 98.2°F

## 2013-06-20 DIAGNOSIS — K08109 Complete loss of teeth, unspecified cause, unspecified class: Secondary | ICD-10-CM

## 2013-06-20 DIAGNOSIS — M264 Malocclusion, unspecified: Secondary | ICD-10-CM

## 2013-06-20 DIAGNOSIS — K045 Chronic apical periodontitis: Secondary | ICD-10-CM

## 2013-06-20 DIAGNOSIS — K029 Dental caries, unspecified: Secondary | ICD-10-CM

## 2013-06-20 DIAGNOSIS — K036 Deposits [accretions] on teeth: Secondary | ICD-10-CM

## 2013-06-20 DIAGNOSIS — C109 Malignant neoplasm of oropharynx, unspecified: Secondary | ICD-10-CM

## 2013-06-20 DIAGNOSIS — Z0189 Encounter for other specified special examinations: Secondary | ICD-10-CM

## 2013-06-20 DIAGNOSIS — IMO0002 Reserved for concepts with insufficient information to code with codable children: Secondary | ICD-10-CM

## 2013-06-20 DIAGNOSIS — K0530 Chronic periodontitis, unspecified: Secondary | ICD-10-CM

## 2013-06-20 DIAGNOSIS — K0889 Other specified disorders of teeth and supporting structures: Secondary | ICD-10-CM

## 2013-06-20 DIAGNOSIS — K089 Disorder of teeth and supporting structures, unspecified: Secondary | ICD-10-CM

## 2013-06-20 DIAGNOSIS — K083 Retained dental root: Secondary | ICD-10-CM

## 2013-06-20 NOTE — Patient Instructions (Addendum)
The patient will have operating procedure with general anesthesia on Monday, 06/24/2013 and 7:30 at [facility]. The patient was reminded to be n.p.o. after midnight for the dental procedures in the operating room. Dr. Enrique Sack    RADIATION THERAPY AND DECISIONS REGARDING YOUR TEETH  Xerostomia (dry mouth) Your salivary glands may be in the filed of radiation.  Radiation may include all or part of your saliva glands.  This will cause your saliva to dry up and you will have a dry mouth.  The dry mouth will be for the rest of your life unless your radiation oncologist tells you otherwise.  Your saliva has many functions:  Saliva wets your tongue for speaking.  It coats your teeth and the inside of your mouth for easier movement.  It helps with chewing and swallowing food.  It helps clean away harmful acid and toxic products made by the germs in your mouth, therefore it helps prevent cavities.  It kills some germs in your mouth and helps to prevent gum disease.  It helps to carry flavor to your taste buds.  Once you have lost your saliva you will be at higher risk for tooth decay and gum disease.  What can be done to help improve your mouth when there's not enough saliva:  1.  Your dentist may give a prescription for Salagen.  It will not bring back all of your saliva but may bring back some of it.  Also your saliva may be thick and ropy or white and foamy. It will not feel like it use to feel.  2.  You will need to swish with water every time your mouth feels dry.  YOU CANNOT suck on any cough drops, mints, lemon drops, candy, vitamin C or any other products.  You cannot use anything other than water to make your mouth feel less dry.  If you want to drink anything else you have to drink it all at once and brush afterwards.  Be sure to discuss the details of your diet habits with your dentist or hygienist.  Radiation caries: This is decay that happens very quickly once your mouth  is very dry due to radiation therapy.  Normally cavities take six months to two years to become a problem.  When you have dry mouth cavities may take as little as eight weeks to cause you a problem.  This is why dental check ups every two months are necessary as long as you have a dry mouth. Radiation caries typically, but not always, start at your gum line where it is hard to see the cavity.  It is therefore also hard to fill these cavities adequately.  This high rate of cavities happens because your mouth no longer has saliva and therefore the acid made by the germs starts the decay process.  Whenever you eat anything the germs in your mouth change the food into acid.  The acid then burns a small hole in your tooth.  This small hole is the beginning of a cavity.  If this is not treated then it will grow bigger and become a cavity.  The way to avoid this hole getting bigger is to use fluoride every evening as prescribed by your dentist.  You have to make sure that your teeth are very clean before you use the fluoride.  This fluoride in turn will strengthen your teeth and prepare them for another day of fighting acid.  If you develop radiation caries many times the  damage is so large that you will have to have all your teeth removed.  This could be a big problem if some of these teeth are in the field of radiation.  Further details of why this could be a big problem will follow.  (See Osteoradionecrosis).  Loss of taste (dysgeusia) This happens to varying degrees once you've had radiation therapy to your jaw region.  Many times taste is not completely lost but becomes limited.  The loss of taste is mostly due to radiation affecting your taste buds.  However if you have no saliva in your mouth to carry the flavor to your taste buds it would be difficult for your taste buds to taste anything.  That is why using water or a prescription for Salagen prior to meals and during meals may help with some of the taste.   Keep in mind that taste generally returns very slowly over the course of several months or several years after radiation therapy.  Don't give up hope.  Trismus According to your Radiation Oncologist your TMJ or jaw joints are going to be partially or fully in the field of radiation.  This means that over time the muscles that help you open and close your mouth may get stiff.  This will potentially result in your not being able to open your mouth wide enough or as wide as you can open it now.  Le me give you an example of how slowly this happens and how unaware people are of it.  A gentlemen that had radiation therapy two years ago came back to me complaining that bananas are just too large for him to be able to fit them in between his teeth.  He was not able to open wide enough to bite into a banana.  This happens slowly and over a period of time.  What do we do to try and prevent this?  Your dentist will probably give you a stack of sticks called a trismus exercise device .  This stack will help your remind your muscles and your jaw joint to open up to the same distance every day.  Use these sticks every morning when you wake up according to the instructions given by the dentist.   You must use these sticks for at least one to two years after radiation therapy.  The reason for that is because it happens so slowly and keeps going on for about two years after radiation therapy.  Your hospital dentist will help you monitor your mouth opening and make sure that it's not getting smaller.  Osteoradionecrosis (ORN) This is a condition where your jaw bone after having had radiation therapy becomes very dry.  It has very little blood supply to keep it alive.  If you develop a cavity that turns into an abscess or an infection then the jaw bone does not have enough blood supply to help fight the infection.  At this point it is very likely that the infection could cause the death of your jaw bone.  When you have dead  bone it has to be removed.  Therefore you might end up having to have surgery to remove part of your jaw bone, the part of the jaw bone that has been affected.   Healing is also a problem if you are to have surgery in the areas where the bone has had radiation therapy.  The same reasons apply.  If you have surgery you need more blood supply which is not  available.  When blood supply and oxygen are not available again, there is a chance for the bone to die.  Occasionally ORN happens on its own with no obvious reason.  This is quite rare.  We believe that patients who continue to smoke and/or drink alcohol have a higher chance of having this bone problem.  Therefore once your jaw bone has had radiation therapy if there are any teeth in that area, you should never have them pulled.  You should also never have any surgery on your teeth or gums in that area unless the oral surgeon or Periodontist is aware of your history of radiation. There is some expensive management techniques that might be used to limit your risks.  The risks for ORN either from infection or spontaneous ( or on it's own) are life long.    TRISMUS  Trismus is a condition where the jaw does not allow the mouth to open as wide as it usually does.  This can happen almost suddenly, or in other cases the process is so slow, it is hard to notice it-until it is too far along.  When the jaw joints and/or muscles have been exposed to radiation treatments, the onset of Trismus is very slow.  This is because the muscles are losing their stretching ability over a long period of time, as long as 2 YEARS after the end of radiation.  It is therefore important to exercise these muscles and joints.  TRISMUS EXERCISES   Stack of tongue depressors measuring the same or a little less than the last documented MIO (Maximum Interincisal Opening).  Secure them with a rubber band on both ends.  Place the stack in the patient's mouth, supporting the other  end.  Allow 30 seconds for muscle stretching.  Rest for a few seconds.  Repeat 3-5 times  For all radiation patients, this exercise is recommended in the mornings and evenings unless otherwise instructed.  The exercise should be done for a period of 2 YEARS after the end of radiation.  MIO should be checked routinely on recall dental visits by the general dentist or the hospital dentist.  The patient is advised to report any changes, soreness, or difficulties encountered when doing the exercises.  FLUORIDE TRAYS PATIENT INSTRUCTIONS    Obtain prescription from the pharmacy.  Don't be surprised if it needs to be ordered.  Be sure to let the pharmacy know when you are close to needing a new refill for them to have it ready for you without interruption of Fluoride use.  The best time to use your Fluoride is before bed time.  You must brush your teeth very well and floss before using the Fluoride in order to get the best use out of the Fluoride treatments.  Place 1 drop of Fluoride gel per tooth in the tray.  Place the tray on your lower teeth and your upper teeth.  Make sure the trays are seated all the way.  Remember, they only fit one way on your teeth.  Insert for 5 full minutes.  At the end of the 5 minutes, take the trays out.  SPIT OUT excess. .  Do NOT rinse your mouth!  Do NOT eat or drink after treatments for at least 30 minutes.  This is why the best time for your treatments is before bedtime.  Clean the inside of your Fluoride trays using COLD WATER and a toothbrush.  In order to keep your Trays from discoloring and free from  odors, soak them overnight in denture cleaners such as Efferdent.  Do not use bleach or non denture products.  Store the trays in a safe dry place AWAY from any heat until your next treatment.  If anything happens to your Fluoride trays, or they don't fit as well after any dental work, please let us know as soon as possible.

## 2013-06-20 NOTE — Progress Notes (Signed)
DENTAL CONSULTATION  Date of Consultation:  06/20/2013 Patient Name:   Derek Morgan Date of Birth:   1955-10-17 Medical Record Number: [id number]  VITALS: BP 152/94  Pulse 94  Temp(Src) 98.2 F (36.8 C) (Oral)  CHIEF COMPLAINT: The patient was referred for medically necessary pre-chemoradiation therapy dental protocol examination.  HPI: Derek Morgan is a 58 year old male resides diagnosed with oropharyngeal cancer with right neck metastases. Patient with anticipated chemoradiation therapy. Patient is now seen as part of a medically necessary pre-chemoradiation therapy dental protocol examination.  The patient currently denies acute toothache, swellings, or abscesses. Patient knows that he has several" loose teeth ". Patient last saw a dentist in 1991. Patient saw Dr. Judeth Horn at that time to have a tooth extracted. Patient denies any complications associated with that dental extraction. Patient denies having any partial dentures. The patient is interested in having all remaining teeth extracted at this time.   PROBLEM LIST: Patient Active Problem List   Diagnosis Date Noted  . Oropharyngeal cancer 06/17/2013    Priority: High  . Weight loss 06/17/2013  . Tobacco abuse 06/17/2013  . Poor dentition 06/17/2013  . Throat pain 06/17/2013    PMH: Past Medical History  Diagnosis Date  . Heavy smoker   . Hoarseness of voice   . Mass in neck 04/2013    right/ Metastatic Squamous Cell Carcinoma  . Loose, teeth     top and bottom  . Weight loss 06/17/2013  . Throat pain 06/17/2013    PSH: Past Surgical History  Procedure Laterality Date  . Broken leg  1998    orif ankle-left-plated  . Direct laryngoscopy N/A 05/22/2013    Procedure: DIRECT LARYNGOSCOPY;  Surgeon: Ascencion Dike, MD;  Location: [facility] OR;  Service: ENT;  Laterality: N/A;  . Tonsillectomy Right 05/22/2013    Procedure: RIGHT TONSILLECTOMY;  Surgeon: Ascencion Dike, MD;  Location: [place];  Service: ENT;  Laterality: Right;  . Mass  biopsy Right 05/22/2013    Procedure:  INCISIONAL BIOPSY NECK MASS;  Surgeon: Ascencion Dike, MD;  Location: [place];  Service: ENT;  Laterality: Right;  . Mass biopsy Right 06/07/2013    Procedure: EXCISIONAL BIOPSY OF RIGHT NECK MASS;  Surgeon: Ascencion Dike, MD;  Location: [facility] OR;  Service: ENT;  Laterality: Right;    ALLERGIES: No Known Allergies  MEDICATIONS: Current Outpatient Prescriptions  Medication Sig Dispense Refill  . docusate sodium (COLACE) 100 MG capsule Take 1 capsule (100 mg total) by mouth 2 (two) times daily.  60 capsule  0  . fluconazole (DIFLUCAN) 100 MG tablet Take 2 tabs today, then 1 tab daily x 20 days for yeast in throat  22 tablet  0  . morphine (MSIR) 15 MG tablet Take 1 tablet (15 mg total) by mouth every 4 (four) hours as needed for severe pain.  60 tablet  0  . oxyCODONE-acetaminophen (ROXICET) 5-325 MG per tablet Take 1-2 tablets by mouth every 4 (four) hours as needed for moderate pain or severe pain.  30 tablet  0   No current facility-administered medications for this visit.    LABS: Lab Results  Component Value Date   WBC 12.1* 06/07/2013   HGB 16.3 06/07/2013   HCT 47.7 06/07/2013   MCV 99.6 06/07/2013   PLT 344 06/07/2013   No results found for this basename: na, k, cl, co2, glucose, bun, creatinine, calcium, gfrnonaa, gfraa   No results found for this basename: INR, PROTIME   No results  found for this basename: PTT    SOCIAL HISTORY: History   Social History  . Marital Status: Single    Spouse Name: N/A    Number of Children: 0  . Years of Education: N/A   Occupational History  . Financial planner    Social History Main Topics  . Smoking status: Current Every Day Smoker -- 0.25 packs/day for 40 years    Types: Cigarettes  . Smokeless tobacco: Never Used     Comment: uses e cigarettes  . Alcohol Use: 3.6 oz/week    6 Cans of beer per week     Comment: occl  . Drug Use: No  . Sexual Activity: Not on file   Other Topics Concern  . Not  on file   Social History Narrative  . No narrative on file    FAMILY HISTORY: Family History  Problem Relation Age of Onset  . CVA Mother   . CAD Father   . Cancer Father     lung ca     REVIEW OF SYSTEMS:  Constitutional: Denies fevers, chills or abnormal night sweats  Eyes: Denies blurriness of vision, double vision or watery eyes  Respiratory: Denies cough, dyspnea or wheezes  Cardiovascular: Denies palpitation, chest discomfort or lower extremity swelling  Gastrointestinal: Denies nausea, heartburn or change in bowel habits  Skin: Denies abnormal skin rashes  Lymphatics: Denies new lymphadenopathy or easy bruising  Neurological:Denies numbness, tingling or new weaknesses  Behavioral/Psych: Mood is stable, no new changes  All other systems were reviewed with the patient and are negative.  DENTAL HISTORY: CHIEF COMPLAINT: The patient was referred for medically necessary pre-chemoradiation therapy dental protocol examination.  HPI: Derek Morgan is a 58 year old male resides diagnosed with oropharyngeal cancer with right neck metastases. Patient with anticipated chemoradiation therapy. Patient is now seen as part of a medically necessary pre-chemoradiation therapy dental protocol examination.  The patient currently denies acute toothache, swellings, or abscesses. Patient knows that he has several" loose teeth ". Patient last saw a dentist in 1991. Patient saw Dr. Judeth Horn at that time to have a tooth extracted. Patient denies any complications associated with that dental extraction. Patient denies having any partial dentures. The patient is interested in having all remaining teeth extracted at this time.  DENTAL EXAMINATION:  GENERAL: The patient is a well-developed, well-nourished male in no acute distress. Patient has significant hoarseness. HEAD AND NECK: The patient has significant right neck lymphadenopathy. Right neck is consistent with previous biopsy. The patient has a  decreased maximum interincisal opening of 25-30 mm. The patient has left TMJ crepitus but denies acute TMJ symptoms.  INTRAORAL EXAM: Patient has normal saliva. There is no evidence of abscess formation within the mouth.   DENTITION: Patient is missing tooth numbers 1, 4, 5, 7, 10, 16, 17, 18, 19, 21, 29, 30, 31, and 32. There are retained roots in the area tooth numbers 9, 11, 19 and 20. PERIODONTAL: The patient has chronic, advanced periodontal disease with plaque and calculus accumulations, generalized gingival recession, and generalized tooth mobility. There is moderate to severe bone loss noted. DENTAL CARIES/SUBOPTIMAL RESTORATIONS: There are multiple dental caries and suboptimal dental restorations as per dental charting form. ENDODONTIC: Patient currently denies acute pulpitis symptoms. Patient does appear to have periapical pathology associated with tooth numbers 6, 9, 11, 19, 20, 23, and 24.  CROWN AND BRIDGE: Patient has a bridge on tooth numbers 6 through 8 that is loose. [place]ment tooth number 6 has extensive dental caries.  PROSTHODONTIC: Patient denies having any partial dentures. OCCLUSION: The patient has a poor occlusal scheme secondary to multiple missing teeth, multiple retained root segments, supra-eruption and drifting of the unopposed teeth into the edentulous areas, and lack of replacement of missing teeth with dental prostheses.  RADIOGRAPHIC INTERPRETATION: An orthopantogram was obtained today. This was supplemented with 9 periapical radiographs as patient tolerated. Patient has extensive hyperactive gag reflex and maxillary right tenderness. There are multiple missing teeth. There are multiple retained root segments. There are multiple areas of periapical pathology and radiolucency. Multiple dental caries are noted. Suboptimal dental restorations are noted. There is moderate to severe bone loss noted. There is pneumatization of the maxillary sinuses.   ASSESSMENTS: 1. Oral  pharyngeal cancer with right neck metastases 2. Pre-chemoradiation therapy dental protocol examination 3. Chronic apical periodontitis 4. Chronic periodontitis with bone loss 5. Generalized gingival recession 6. Generalized tooth mobility 7. Multiple dental caries and suboptimal dental restorations 8. Multiple retained root segments 9. Multiple missing teeth 9. Supra-eruption and drifting of the unopposed teeth into the edentulous areas. 10. No history of partial dentures 11. Poor occlusal scheme and malocclusion 12. Decreased maximum interincisal opening 13. Pneumatization of the bilateral maxillary sinuses   PLAN/RECOMMENDATIONS: 1. I discussed the risks, benefits, and complications of various treatment options with the patient in relationship to his medical and dental conditions, anticipated chemoradiation therapy, and chemoradiation therapy side effects to include xerostomia, radiation caries, trismus, mucositis, taste changes, gum and jawbone changes, and risk for infection, bleeding, and osteoradionecrosis.   We discussed various treatment options to include no treatment, multiple extractions with alveoloplasty, pre-prosthetic surgery as indicated, periodontal therapy, dental restorations, root canal therapy, crown and bridge therapy, implant therapy, and replacement of missing teeth as indicated. The patient currently wishes to proceed with extraction of remaining teeth with alveoloplasty and pre-prosthetic surgery as indicated in the operating room with general anesthesia on Monday, 06-24-2013 at 7:30 AM at [facility]. The patient is aware of the potential complications for bleeding, bruising, infection, swelling, pain, maxillary sinus involvement, nerve damage, root tip fracture, mandible fracture, and risks for respiratory complications with general anesthesia. Patient also is aware of other potential complications not mentioned above. The patient will then followup with a dentist  of his choice for fabrication of upper and lower complete dentures after adequate healing and approximately 3 months after the last radiation therapy treatment has been provided. Patient is aware that he will need at least 2 weeks of healing prior to start of chemoradiation therapy.  2. Discussion of findings with medical team and coordination of future medical and dental care as needed.  I spent 75 minutes face to face with patient and more than 50% of time was spent in counseling and /or coordination of care.   Lenn Cal, DDS

## 2013-06-21 ENCOUNTER — Encounter (HOSPITAL_COMMUNITY): Payer: Self-pay

## 2013-06-21 ENCOUNTER — Encounter (HOSPITAL_COMMUNITY)
Admission: RE | Admit: 2013-06-21 | Discharge: 2013-06-21 | Disposition: A | Discharge status: Home / Self Care | Payer: BC Managed Care – PPO | Source: Ambulatory Visit | Attending: Dentistry | Admitting: Dentistry

## 2013-06-21 HISTORY — DX: Shortness of breath: R06.02

## 2013-06-21 LAB — CBC
HEMATOCRIT: 43.7 % (ref 39.0–52.0)
Hemoglobin: 14.5 g/dL (ref 13.0–17.0)
MCH: 33 pg (ref 26.0–34.0)
MCHC: 33.2 g/dL (ref 30.0–36.0)
MCV: 99.5 fL (ref 78.0–100.0)
Platelets: 281 10*3/uL (ref 150–400)
RBC: 4.39 MIL/uL (ref 4.22–5.81)
RDW: 13 % (ref 11.5–15.5)
WBC: 9.9 10*3/uL (ref 4.0–10.5)

## 2013-06-21 NOTE — Progress Notes (Signed)
Pt denies SOB, chest pain, and being under the care of a cardiologist. Pt denies having a chest x ray, EKG, stress test, echo and cardiac cath. Pt chart forwarded to [place], [place] ( anesthesia) for anesthesia consult.

## 2013-06-21 NOTE — Pre-Procedure Instructions (Signed)
Derek Morgan  06/21/2013   Your procedure is scheduled on: Monday, June 24, 2013   Report to [facility] (use Main Entrance "A'') at 5:30 AM.  Call this number if you have problems the morning of surgery: [phone]   Remember:   Do not eat food or drink liquids after midnight.   Take these medicines the morning of surgery with A SIP OF WATER: fluconazole (DIFLUCAN),  If needed: pain medication Stop taking Aspirin, vitamins and herbal medications. Do not take any NSAIDs ie: Ibuprofen, Advil, Naproxen or any medication containing Aspirin.  Do not wear jewelry, make-up or nail polish.  Do not wear lotions, powders, or perfumes. You may wear deodorant.  Do not shave 48 hours prior to surgery. Men may shave face and neck.  Do not bring valuables to the hospital.  [facility] is not responsible for any belongings or valuables.               Contacts, dentures or bridgework may not be worn into surgery.  Leave suitcase in the car. After surgery it may be brought to your room.  For patients admitted to the hospital, discharge time is determined by your treatment team.               Patients discharged the day of surgery will not be allowed to drive home.  Name and phone number of your driver:   Special Instructions:  Special Instructions:Special Instructions: [facility] - Preparing for Surgery  Before surgery, you can play an important role.  Because skin is not sterile, your skin needs to be as free of germs as possible.  You can reduce the number of germs on you skin by washing with CHG (chlorahexidine gluconate) soap before surgery.  CHG is an antiseptic cleaner which kills germs and bonds with the skin to continue killing germs even after washing.  Please DO NOT use if you have an allergy to CHG or antibacterial soaps.  If your skin becomes reddened/irritated stop using the CHG and inform your nurse when you arrive at Short Stay.  Do not shave (including legs and underarms) for  at least 48 hours prior to the first CHG shower.  You may shave your face.  Please follow these instructions carefully:   1.  Shower with CHG Soap the night before surgery and the morning of Surgery.  2.  If you choose to wash your hair, wash your hair first as usual with your normal shampoo.  3.  After you shampoo, rinse your hair and body thoroughly to remove the Shampoo.  4.  Use CHG as you would any other liquid soap.  You can apply chg directly  to the skin and wash gently with scrungie or a clean washcloth.  5.  Apply the CHG Soap to your body ONLY FROM THE NECK DOWN.  Do not use on open wounds or open sores.  Avoid contact with your eyes, ears, mouth and genitals (private parts).  Wash genitals (private parts) with your normal soap.  6.  Wash thoroughly, paying special attention to the area where your surgery will be performed.  7.  Thoroughly rinse your body with warm water from the neck down.  8.  DO NOT shower/wash with your normal soap after using and rinsing off the CHG Soap.  9.  Pat yourself dry with a clean towel.            10.  Wear clean pajamas.  11.  Place clean sheets on your bed the night of your first shower and do not sleep with pets.  Day of Surgery  Do not apply any lotions the morning of surgery.  Please wear clean clothes to the hospital/surgery center.   Please read over the following fact sheets that you were given: Pain Booklet and Surgical Site Infection Prevention

## 2013-06-21 NOTE — Progress Notes (Signed)
Anesthesia chart review: Patient is a 58 year old male scheduled for multiple teeth extractions with alveoloplasty on 06/24/13 by Dr. Enrique Sack.  He underwent excisional biopsy of the right neck on 06/07/13 and direct laryngoscopy, right tonsillectomy with right neck biopsy on 05/22/13.  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  Other history includes smoking, weight loss, hoarseness.  Current BMI is listed as 15.97.  He has no documented history of hypertension, although he has had an elevated BP on several occasions in Epic (primarily 150's/90's). No PCP is listed. HEM-ONC is Dr. Alvy Bimler.  Chemoradiation is anticipated. He is being referred to [facility] Dr. Marlou Starks for evaluation of a feeding tube in the future (appointment 07/02/13).  He has tolerated two recent procedures under GA, so if no acute changes then I would anticipate that he could proceed as planned.  [facility]/Anesthesiology Phone [phone] 06/21/2013 11:07 AM

## 2013-06-23 MED ORDER — CEFAZOLIN SODIUM-DEXTROSE 2-3 GM-% IV SOLR
2.0000 g | Freq: Once | INTRAVENOUS | Status: AC
  Administered 2013-06-24: 2 g via INTRAVENOUS
  Filled 2013-06-23: qty 50

## 2013-06-24 ENCOUNTER — Encounter (HOSPITAL_COMMUNITY)
Admission: RE | Disposition: A | Discharge status: Home / Self Care | Payer: Self-pay | Source: Ambulatory Visit | Attending: Dentistry

## 2013-06-24 ENCOUNTER — Telehealth: Payer: Self-pay | Admitting: Hematology and Oncology

## 2013-06-24 ENCOUNTER — Other Ambulatory Visit: Payer: Self-pay | Admitting: Radiation Oncology

## 2013-06-24 ENCOUNTER — Encounter (HOSPITAL_COMMUNITY): Payer: Self-pay | Admitting: *Deleted

## 2013-06-24 ENCOUNTER — Telehealth: Payer: Self-pay | Admitting: *Deleted

## 2013-06-24 ENCOUNTER — Other Ambulatory Visit: Payer: Self-pay | Admitting: Hematology and Oncology

## 2013-06-24 ENCOUNTER — Ambulatory Visit (HOSPITAL_COMMUNITY)
Admission: RE | Admit: 2013-06-24 | Discharge: 2013-06-24 | Disposition: A | Discharge status: Home / Self Care | Payer: BC Managed Care – PPO | Source: Ambulatory Visit | Attending: Dentistry | Admitting: Dentistry

## 2013-06-24 ENCOUNTER — Ambulatory Visit (HOSPITAL_COMMUNITY): Payer: BC Managed Care – PPO | Admitting: Certified Registered Nurse Anesthetist

## 2013-06-24 ENCOUNTER — Encounter (HOSPITAL_COMMUNITY): Payer: BC Managed Care – PPO | Admitting: Vascular Surgery

## 2013-06-24 DIAGNOSIS — C109 Malignant neoplasm of oropharynx, unspecified: Secondary | ICD-10-CM

## 2013-06-24 DIAGNOSIS — K0889 Other specified disorders of teeth and supporting structures: Secondary | ICD-10-CM

## 2013-06-24 DIAGNOSIS — K029 Dental caries, unspecified: Secondary | ICD-10-CM | POA: Insufficient documentation

## 2013-06-24 DIAGNOSIS — K089 Disorder of teeth and supporting structures, unspecified: Secondary | ICD-10-CM

## 2013-06-24 DIAGNOSIS — K0530 Chronic periodontitis, unspecified: Secondary | ICD-10-CM | POA: Insufficient documentation

## 2013-06-24 DIAGNOSIS — F17200 Nicotine dependence, unspecified, uncomplicated: Secondary | ICD-10-CM | POA: Insufficient documentation

## 2013-06-24 DIAGNOSIS — C4442 Squamous cell carcinoma of skin of scalp and neck: Secondary | ICD-10-CM | POA: Insufficient documentation

## 2013-06-24 DIAGNOSIS — K083 Retained dental root: Secondary | ICD-10-CM

## 2013-06-24 DIAGNOSIS — K045 Chronic apical periodontitis: Secondary | ICD-10-CM

## 2013-06-24 DIAGNOSIS — Z79899 Other long term (current) drug therapy: Secondary | ICD-10-CM | POA: Insufficient documentation

## 2013-06-24 HISTORY — DX: Other specified disorders of teeth and supporting structures: K08.89

## 2013-06-24 HISTORY — PX: MULTIPLE EXTRACTIONS WITH ALVEOLOPLASTY: SHX5342

## 2013-06-24 SURGERY — MULTIPLE EXTRACTION WITH ALVEOLOPLASTY
Anesthesia: General | Site: Mouth

## 2013-06-24 MED ORDER — ALCOHOL (RUBBING) 70 % SOLN
Status: DC | PRN
  Administered 2013-06-24: 100 mL via TOPICAL

## 2013-06-24 MED ORDER — ONDANSETRON HCL 4 MG/2ML IJ SOLN: 4.0000 mg | Freq: Once | INTRAMUSCULAR | Status: DC | PRN

## 2013-06-24 MED ORDER — FENTANYL CITRATE 0.05 MG/ML IJ SOLN
INTRAMUSCULAR | Status: DC | PRN
  Administered 2013-06-24: 100 ug via INTRAVENOUS

## 2013-06-24 MED ORDER — LIDOCAINE-EPINEPHRINE 2 %-1:100000 IJ SOLN
INTRAMUSCULAR | Status: DC | PRN
  Administered 2013-06-24: 1.7 mL via INTRADERMAL

## 2013-06-24 MED ORDER — LIDOCAINE-EPINEPHRINE 2 %-1:100000 IJ SOLN
INTRAMUSCULAR | Status: AC
  Filled 2013-06-24: qty 10.2

## 2013-06-24 MED ORDER — OXYCODONE HCL 5 MG PO TABS: 5.0000 mg | ORAL_TABLET | Freq: Once | ORAL | Status: DC | PRN

## 2013-06-24 MED ORDER — WATER FOR IRRIGATION, STERILE IR SOLN
Status: DC | PRN
  Administered 2013-06-24: 1000 mL

## 2013-06-24 MED ORDER — OXYCODONE HCL 5 MG/5ML PO SOLN: 5.0000 mg | Freq: Once | ORAL | Status: DC | PRN

## 2013-06-24 MED ORDER — ONDANSETRON HCL 4 MG/2ML IJ SOLN
INTRAMUSCULAR | Status: AC
  Filled 2013-06-24: qty 2

## 2013-06-24 MED ORDER — PHENYLEPHRINE 40 MCG/ML (10ML) SYRINGE FOR IV PUSH (FOR BLOOD PRESSURE SUPPORT)
PREFILLED_SYRINGE | INTRAVENOUS | Status: AC
  Filled 2013-06-24: qty 10

## 2013-06-24 MED ORDER — GLYCOPYRROLATE 0.2 MG/ML IJ SOLN
INTRAMUSCULAR | Status: DC | PRN
  Administered 2013-06-24: 0.6 mg via INTRAVENOUS

## 2013-06-24 MED ORDER — SODIUM CHLORIDE 0.9 % IJ SOLN
INTRAMUSCULAR | Status: AC
  Filled 2013-06-24: qty 10

## 2013-06-24 MED ORDER — FENTANYL CITRATE 0.05 MG/ML IJ SOLN
INTRAMUSCULAR | Status: AC
  Filled 2013-06-24: qty 5

## 2013-06-24 MED ORDER — SUCCINYLCHOLINE CHLORIDE 20 MG/ML IJ SOLN
INTRAMUSCULAR | Status: DC | PRN
  Administered 2013-06-24: 100 mg via INTRAVENOUS

## 2013-06-24 MED ORDER — NEOSTIGMINE METHYLSULFATE 10 MG/10ML IV SOLN
INTRAVENOUS | Status: DC | PRN
Start: 2013-06-24 — End: 2013-06-24
  Administered 2013-06-24: 3 mg via INTRAVENOUS

## 2013-06-24 MED ORDER — ROCURONIUM BROMIDE 50 MG/5ML IV SOLN
INTRAVENOUS | Status: AC
  Filled 2013-06-24: qty 1

## 2013-06-24 MED ORDER — LIDOCAINE HCL (CARDIAC) 20 MG/ML IV SOLN
INTRAVENOUS | Status: AC
  Filled 2013-06-24: qty 5

## 2013-06-24 MED ORDER — SODIUM CHLORIDE 0.9 % IJ SOLN
OROMUCOSAL | Status: DC | PRN
  Administered 2013-06-24: 08:00:00 via TOPICAL

## 2013-06-24 MED ORDER — SUCCINYLCHOLINE CHLORIDE 20 MG/ML IJ SOLN
INTRAMUSCULAR | Status: AC
  Filled 2013-06-24: qty 1

## 2013-06-24 MED ORDER — MIDAZOLAM HCL 2 MG/2ML IJ SOLN
INTRAMUSCULAR | Status: AC
  Filled 2013-06-24: qty 2

## 2013-06-24 MED ORDER — EPHEDRINE SULFATE 50 MG/ML IJ SOLN
INTRAMUSCULAR | Status: DC | PRN
  Administered 2013-06-24 (×2): 15 mg via INTRAVENOUS

## 2013-06-24 MED ORDER — BUPIVACAINE-EPINEPHRINE 0.5% -1:200000 IJ SOLN
INTRAMUSCULAR | Status: DC | PRN
  Administered 2013-06-24: 3.6 mL

## 2013-06-24 MED ORDER — LACTATED RINGERS IV SOLN
INTRAVENOUS | Status: DC | PRN
  Administered 2013-06-24 (×2): via INTRAVENOUS

## 2013-06-24 MED ORDER — FENTANYL CITRATE 0.05 MG/ML IJ SOLN: 25.0000 ug | INTRAMUSCULAR | Status: DC | PRN

## 2013-06-24 MED ORDER — BUPIVACAINE-EPINEPHRINE (PF) 0.5% -1:200000 IJ SOLN
INTRAMUSCULAR | Status: AC
  Filled 2013-06-24: qty 3.6

## 2013-06-24 MED ORDER — 0.9 % SODIUM CHLORIDE (POUR BTL) OPTIME
TOPICAL | Status: DC | PRN
  Administered 2013-06-24: 1000 mL

## 2013-06-24 MED ORDER — ACETAMINOPHEN 325 MG PO TABS: 325.0000 mg | ORAL_TABLET | ORAL | Status: DC | PRN

## 2013-06-24 MED ORDER — ACETAMINOPHEN 160 MG/5ML PO SOLN
325.0000 mg | ORAL | Status: DC | PRN
  Filled 2013-06-24: qty 20.3

## 2013-06-24 MED ORDER — PROPOFOL 10 MG/ML IV BOLUS
INTRAVENOUS | Status: DC | PRN
  Administered 2013-06-24: 140 mg via INTRAVENOUS

## 2013-06-24 MED ORDER — PROPOFOL 10 MG/ML IV BOLUS
INTRAVENOUS | Status: AC
  Filled 2013-06-24: qty 20

## 2013-06-24 MED ORDER — ONDANSETRON HCL 4 MG/2ML IJ SOLN
INTRAMUSCULAR | Status: DC | PRN
  Administered 2013-06-24: 4 mg via INTRAVENOUS

## 2013-06-24 MED ORDER — EPHEDRINE SULFATE 50 MG/ML IJ SOLN
INTRAMUSCULAR | Status: AC
  Filled 2013-06-24: qty 1

## 2013-06-24 MED ORDER — ROCURONIUM BROMIDE 100 MG/10ML IV SOLN
INTRAVENOUS | Status: DC | PRN
  Administered 2013-06-24: 25 mg via INTRAVENOUS

## 2013-06-24 MED ORDER — MIDAZOLAM HCL 5 MG/5ML IJ SOLN
INTRAMUSCULAR | Status: DC | PRN
  Administered 2013-06-24: 2 mg via INTRAVENOUS

## 2013-06-24 MED ORDER — PHENYLEPHRINE HCL 10 MG/ML IJ SOLN
INTRAMUSCULAR | Status: DC | PRN
  Administered 2013-06-24: 120 ug via INTRAVENOUS
  Administered 2013-06-24 (×5): 80 ug via INTRAVENOUS

## 2013-06-24 MED ORDER — OXYCODONE-ACETAMINOPHEN 5-325 MG PO TABS: ORAL_TABLET | ORAL | Status: DC

## 2013-06-24 SURGICAL SUPPLY — 39 items
ALCOHOL 70% 16 OZ (MISCELLANEOUS) ×3 IMPLANT
ATTRACTOMAT 16X20 MAGNETIC DRP (DRAPES) ×3 IMPLANT
BLADE 10 SAFETY STRL DISP (BLADE) ×3 IMPLANT
BLADE SURG 15 STRL LF DISP TIS (BLADE) ×2 IMPLANT
BLADE SURG 15 STRL SS (BLADE) ×6
COVER SURGICAL LIGHT HANDLE (MISCELLANEOUS) ×3 IMPLANT
GAUZE PACKING FOLDED 2  STR (GAUZE/BANDAGES/DRESSINGS) ×4
GAUZE PACKING FOLDED 2 STR (GAUZE/BANDAGES/DRESSINGS) ×1 IMPLANT
GAUZE SPONGE 4X4 16PLY XRAY LF (GAUZE/BANDAGES/DRESSINGS) ×5 IMPLANT
GLOVE BIO SURGEON STRL SZ 6.5 (GLOVE) ×1 IMPLANT
GLOVE BIO SURGEONS STRL SZ 6.5 (GLOVE) ×1
GLOVE BIOGEL PI IND STRL 6 (GLOVE) ×1 IMPLANT
GLOVE BIOGEL PI INDICATOR 6 (GLOVE) ×2
GLOVE SURG ORTHO 8.0 STRL STRW (GLOVE) ×3 IMPLANT
GLOVE SURG SS PI 6.0 STRL IVOR (GLOVE) ×3 IMPLANT
GOWN STRL REUS W/ TWL LRG LVL3 (GOWN DISPOSABLE) ×1 IMPLANT
GOWN STRL REUS W/TWL 2XL LVL3 (GOWN DISPOSABLE) ×3 IMPLANT
GOWN STRL REUS W/TWL LRG LVL3 (GOWN DISPOSABLE) ×3
HEMOSTAT SURGICEL .5X2 ABSORB (HEMOSTASIS) IMPLANT
KIT BASIN OR (CUSTOM PROCEDURE TRAY) ×3 IMPLANT
KIT ROOM TURNOVER OR (KITS) ×3 IMPLANT
MANIFOLD NEPTUNE WASTE (CANNULA) ×3 IMPLANT
NDL BLUNT 16X1.5 OR ONLY (NEEDLE) ×1 IMPLANT
NEEDLE BLUNT 16X1.5 OR ONLY (NEEDLE) ×3 IMPLANT
NS IRRIG 1000ML POUR BTL (IV SOLUTION) ×3 IMPLANT
PACK EENT II TURBAN DRAPE (CUSTOM PROCEDURE TRAY) ×3 IMPLANT
PAD ARMBOARD 7.5X6 YLW CONV (MISCELLANEOUS) ×3 IMPLANT
SPONGE GAUZE 4X4 12PLY STER LF (GAUZE/BANDAGES/DRESSINGS) ×2 IMPLANT
SPONGE SURGIFOAM ABS GEL 100 (HEMOSTASIS) ×2 IMPLANT
SPONGE SURGIFOAM ABS GEL 12-7 (HEMOSTASIS) IMPLANT
SPONGE SURGIFOAM ABS GEL SZ50 (HEMOSTASIS) IMPLANT
SUCTION FRAZIER TIP 10 FR DISP (SUCTIONS) ×3 IMPLANT
SUT CHROMIC 3 0 PS 2 (SUTURE) ×6 IMPLANT
SUT CHROMIC 4 0 P 3 18 (SUTURE) ×2 IMPLANT
SYR 50ML SLIP (SYRINGE) ×5 IMPLANT
TOWEL OR 17X26 10 PK STRL BLUE (TOWEL DISPOSABLE) ×3 IMPLANT
TUBE CONNECTING 12'X1/4 (SUCTIONS) ×1
TUBE CONNECTING 12X1/4 (SUCTIONS) ×2 IMPLANT
YANKAUER SUCT BULB TIP NO VENT (SUCTIONS) ×3 IMPLANT

## 2013-06-24 NOTE — Anesthesia Preprocedure Evaluation (Signed)
Anesthesia Evaluation  Patient identified by MRN, date of birth, ID band Patient awake    Reviewed: Allergy & Precautions, H&P , NPO status , Patient's Chart, lab work & pertinent test results  History of Anesthesia Complications Negative for: history of anesthetic complications  Airway  TM Distance: >3 FB Neck ROM: Full   Comment: Mal 4, adequate mouth opening, recent right neck mass resection with medial displacement of the right base of the tongue. Previous Mac 4 grade 3 on 5/22 Dental  (+) Poor Dentition, Loose,    Pulmonary shortness of breath, neg sleep apnea, neg COPDCurrent Smoker,  breath sounds clear to auscultation        Cardiovascular negative cardio ROS  Rhythm:Regular     Neuro/Psych negative neurological ROS  negative psych ROS   GI/Hepatic negative GI ROS, Neg liver ROS,   Endo/Other  negative endocrine ROS  Renal/GU negative Renal ROS     Musculoskeletal   Abdominal   Peds  Hematology negative hematology ROS (+)   Anesthesia Other Findings   Reproductive/Obstetrics                           Anesthesia Physical Anesthesia Plan  ASA: III  Anesthesia Plan: General   Post-op Pain Management:    Induction: Intravenous  Airway Management Planned: Oral ETT, Video Laryngoscope Planned and Nasal ETT  Additional Equipment: None  Intra-op Plan:   Post-operative Plan: Extubation in OR  Informed Consent: I have reviewed the patients History and Physical, chart, labs and discussed the procedure including the risks, benefits and alternatives for the proposed anesthesia with the patient or authorized representative who has indicated his/her understanding and acceptance.   Dental advisory given  Plan Discussed with: CRNA and Surgeon  Anesthesia Plan Comments:         Anesthesia Quick Evaluation

## 2013-06-24 NOTE — Telephone Encounter (Signed)
Per staff message and POF I have scheduled appts.  JMW  

## 2013-06-24 NOTE — Anesthesia Postprocedure Evaluation (Signed)
  Anesthesia Post-op Note  Patient: Derek Morgan  Procedure(s) Performed: Procedure(s): Extraction of tooth #'s 2,3,6,8,9,11,12,13,14,15,19,20,22,23,24,25,26,27,28 with alveoloplasty. (N/A)  Patient Location: PACU  Anesthesia Type:General  Level of Consciousness: awake, alert  and oriented  Airway and Oxygen Therapy: Patient Spontanous Breathing and Patient connected to nasal cannula oxygen  Post-op Pain: mild  Post-op Assessment: Post-op Vital signs reviewed, Patient's Cardiovascular Status Stable, Respiratory Function Stable, Patent Airway and Pain level controlled  Post-op Vital Signs: stable  Last Vitals:  Filed Vitals:   06/24/13 1000  BP: 122/64  Pulse: 92  Temp: 36.5 C  Resp: 18    Complications: No apparent anesthesia complications

## 2013-06-24 NOTE — Telephone Encounter (Signed)
per pof to sch appt for chemo teach-pt has completed chemo edu 6/3 per sch/sent email to MW to sch trmt-no labs ordered  on this pof

## 2013-06-24 NOTE — Telephone Encounter (Signed)
Called patient to inform of lab appt. For 06-26-13 @ 1:45 , no answer line busy will call later.

## 2013-06-24 NOTE — Transfer of Care (Signed)
Immediate Anesthesia Transfer of Care Note  Patient: Derik Fults  Procedure(s) Performed: Procedure(s): Extraction of tooth #'s 2,3,6,8,9,11,12,13,14,15,19,20,22,23,24,25,26,27,28 with alveoloplasty. (N/A)  Patient Location: PACU  Anesthesia Type:General  Level of Consciousness: awake, alert , oriented and patient cooperative  Airway & Oxygen Therapy: Patient Spontanous Breathing and Patient connected to nasal cannula oxygen  Post-op Assessment: Report given to PACU RN, Post -op Vital signs reviewed and stable and Patient moving all extremities X 4  Post vital signs: Reviewed and stable  Complications: No apparent anesthesia complications
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
## 2013-06-24 NOTE — Anesthesia Procedure Notes (Signed)
Procedure Name: Intubation Date/Time: 06/24/2013 7:47 AM Performed by: Carney Living Pre-anesthesia Checklist: Patient identified, Emergency Drugs available, Suction available, Patient being monitored and Timeout performed Patient Re-evaluated:Patient Re-evaluated prior to inductionOxygen Delivery Method: Circle system utilized Preoxygenation: Pre-oxygenation with 100% oxygen Intubation Type: IV induction Tube type: Oral Tube size: 7.0 mm Number of attempts: 1 Airway Equipment and Method: Stylet and Video-laryngoscopy Placement Confirmation: ETT inserted through vocal cords under direct vision Secured at: 22 cm Tube secured with: Tape Dental Injury: Teeth and Oropharynx as per pre-operative assessment  Difficulty Due To: Difficulty was anticipated, Difficult Airway- due to anterior larynx and Difficult Airway-  due to edematous airway Future Recommendations: Recommend- induction with short-acting agent, and alternative techniques readily available Comments: Pt requires extreme bend of ETT with rigid stylette to facilitate placement. 7.0 ETT easily placed through cords, larynx displaced to the left due to mass, +ETC02, BBS=, VSS

## 2013-06-24 NOTE — Telephone Encounter (Signed)
Called patient to inform of lab appt. For 06-26-13, lvm for a return call

## 2013-06-24 NOTE — Discharge Instructions (Signed)
Sinus precautions after surgery: 1. Avoid blowing your nose.   2. Avoid sneezing. If you might sneeze, Keep her mouth open and do not pinch your nose closed. 3. Avoid sucking on straws. Avoid smoking. 4. Do not lift objects weighing more than 20 pounds Call if questions arise. Dr. Enrique Sack     MOUTH CARE AFTER SURGERY  FACTS:  Ice used in ice bag helps keep the swelling down, and can help lessen the pain.  It is easier to treat pain BEFORE it happens.  Spitting disturbs the clot and may cause bleeding to start again, or to get worse.  Smoking delays healing and can cause complications.  Sharing prescriptions can be dangerous.  Do not take medications not recently prescribed for you.  Antibiotics may stop birth control pills from working.  Use other means of birth control while on antibiotics.  Warm salt water rinses after the first 24 hours will help lessen the swelling:  Use 1/2 teaspoonful of table salt per oz.of water.  DO NOT:  Do not spit.  Do not drink through a straw.  Strongly advised not to smoke, dip snuff or chew tobacco at least for 3 days.  Do not eat sharp or crunchy foods.  Avoid the area of surgery when chewing.  Do not stop your antibiotics before your instructions say to do so.  Do not eat hot foods until bleeding has stopped.  If you need to, let your food cool down to room temperature.  EXPECT:  Some swelling, especially first 2-3 days.  Soreness or discomfort in varying degrees.  Follow your dentist's instructions about how to handle pain before it starts.  Pinkish saliva or light blood in saliva, or on your pillow in the morning.  This can last around 24 hours.  Bruising inside or outside the mouth.  This may not show up until 2-3 days after surgery.  Don't worry, it will go away in time.  Pieces of "bone" may work themselves loose.  It's OK.  If they bother you, let us know.  WHAT TO DO IMMEDIATELY AFTER SURGERY:  Bite on the gauze with  steady pressure for 1-2 hours.  Don't chew on the gauze.  Do not lie down flat.  Raise your head support especially for the first 24 hours.  Apply ice to your face on the side of the surgery.  You may apply it 20 minutes on and a few minutes off.  Ice for 8-12 hours.  You may use ice up to 24 hours.  Before the numbness wears off, take a pain pill as instructed.  Prescription pain medication is not always required.  SWELLING:  Expect swelling for the first couple of days.  It should get better after that.  If swelling increases 3 days or so after surgery; let us know as soon as possible.  FEVER:  Take Tylenol every 4 hours if needed to lower your temperature, especially if it is at 100F or higher.  Drink lots of fluids.  If the fever does not go away, let us know.  BREATHING TROUBLE:  Any unusual difficulty breathing means you have to have someone bring you to the emergency room ASAP  BLEEDING:  Light oozing is expected for 24 hours or so.  Prop head up with pillows  Avoid spitting  Do not confuse bright red fresh flowing blood with lots of saliva colored with a little bit of blood.  If you notice some bleeding, place gauze or a tea bag  where it is bleeding and apply CONSTANT pressure by biting down for 1 hour.  Avoid talking during this time.  Do not remove the gauze or tea bag during this hour to "check" the bleeding.  If you notice bright RED bleeding FLOWING out of particular area, and filling the floor of your mouth, put a wad of gauze on that area, bite down firmly and constantly.  Call us immediately.  If we're closed, have someone bring you to the emergency room.  ORAL HYGIENE:  Brush your teeth as usual after meals and before bedtime.  Use a soft toothbrush around the area of surgery.  DO NOT AVOID BRUSHING.  Otherwise bacteria(germs) will grow and may delay healing or encourage infection.  Since you cannot spit, just gently rinse and let the water flow out  of your mouth.  DO NOT SWISH HARD.  EATING:  Cool liquids are a good point to start.  Increase to soft foods as tolerated.  PRESCRIPTIONS:  Follow the directions for your prescriptions exactly as written.  If Dr. Enrique Sack gave you a narcotic pain medication, do not drive, operate machinery or drink alcohol when on that medication.  QUESTIONS:  Call our office during office hours [phone] or call the Emergency Room at [phone].  General Anesthesia, Adult, Care After  Refer to this sheet in the next few weeks. These instructions provide you with information on caring for yourself after your procedure. Your health care provider may also give you more specific instructions. Your treatment has been planned according to current medical practices, but problems sometimes occur. Call your health care provider if you have any problems or questions after your procedure.  WHAT TO EXPECT AFTER THE PROCEDURE  After the procedure, it is typical to experience:  Sleepiness.  Nausea and vomiting. HOME CARE INSTRUCTIONS  For the first 24 hours after general anesthesia:  Have a responsible person with you.  Do not drive a car. If you are alone, do not take public transportation.  Do not drink alcohol.  Do not take medicine that has not been prescribed by your health care provider.  Do not sign important papers or make important decisions.  You may resume a normal diet and activities as directed by your health care provider.  Change bandages (dressings) as directed.  If you have questions or problems that seem related to general anesthesia, call the hospital and ask for the anesthetist or anesthesiologist on call. SEEK MEDICAL CARE IF:  You have nausea and vomiting that continue the day after anesthesia.  You develop a rash. SEEK IMMEDIATE MEDICAL CARE IF:  You have difficulty breathing.  You have chest pain.  You have any allergic problems. Document Released: 04/11/2000 Document Revised:  09/05/2012 Document Reviewed: 07/19/2012  [facility] Patient Information 2014 [place], [place].

## 2013-06-24 NOTE — Op Note (Signed)
Patient:            Derek Morgan Date of Birth:  05-28-1955 MRN:                [id number]   DATE OF PROCEDURE:  06/24/2013               OPERATIVE REPORT   PREOPERATIVE DIAGNOSES: 1. Oropharyngeal cancer 2. Pre- chemoradiation therapy dental protocol 3. Chronic apical periodontitis 4. Multiple retained root segments 5. Chronic periodontitis 6. Dental caries 7. Pneumatization of the maxillary sinuses  POSTOPERATIVE DIAGNOSES: 1. Oropharyngeal cancer 2. Pre- chemoradiation therapy dental protocol 3. Chronic apical periodontitis 4. Multiple retained root segments 5. Chronic periodontitis 6. Dental caries 7. Pneumatization of the maxillary sinuses  OPERATIONS: 1. Multiple extraction of tooth numbers 2, 3, 6, 8, 9, 11, 12, 13, 14, 15, 19, 20, 22, 23, 24, 25, 26, 27, and 28 2. 4 Quadrants of alveoloplasty   SURGEON: Lenn Cal, DDS  ASSISTANT: Camie Patience, (dental assistant)  ANESTHESIA: General anesthesia via oral endotracheal tube.  MEDICATIONS: 1. Ancef 2 g IV prior to invasive dental procedures. 2. Local anesthesia with a total utilization of 6 carpules each containing 34 mg of lidocaine with 0.017 mg of epinephrine as well as 2 carpules each containing 9 mg of bupivacaine with 0.009 mg of epinephrine.  SPECIMENS: There are 19 teeth that were discarded.  DRAINS: None  CULTURES: None  COMPLICATIONS: None   ESTIMATED BLOOD LOSS: 100 mLs.  INTRAVENOUS FLUIDS: 1500 mLs of Lactated ringers solution.  INDICATIONS: The patient was recently diagnosed with oropharyngeal cancer.  A medically necessary dental consultation was then requested to evaluate poor dentition.  The patient was examined and treatment planned for extraction of remaining teeth with alveoloplasty and pre-prosthetic surgery as indicated.  This treatment plan was formulated to decrease the risks and complications associated with dental infection from affecting the patient's systemic health and to  prevent future complications such as infection or osteoradionecrosis.  OPERATIVE FINDINGS: Patient was examined operating room number 14.  The teeth were identified for extraction. The patient was noted be affected by chronic periodontitis, chronic apical periodontitis, dental caries, multiple retained root segments, and pneumatization of the maxillary sinuses.   DESCRIPTION OF PROCEDURE: Patient was brought to the main operating room number 14. Patient was then placed in the supine position on the operating table. general anesthesia was then induced per the anesthesia team. The patient was then prepped and draped in the usual manner for dental medicine procedure. A timeout was performed. The patient was identified and procedures were verified. A throat pack was placed at this time. The oral cavity was then thoroughly examined with the findings noted above. The patient was then ready for dental medicine procedure as follows:  Local anesthesia was then administered sequentially with a total utilization of 6 carpules each containing 34 mg of lidocaine with 0.017 mg of epinephrine as well as 2 carpules  each containing 9 mg bupivacaine with 0.009 mg of epinephrine.  The Maxillary left and right quadrants first approached. Anesthesia was then delivered utilizing infiltration with lidocaine with epinephrine. A #15 blade incision was then made from the maxillary right tuberosity and extended to the maxillary left tuberosity.  A  surgical flap was then carefully reflected. The teeth were then subluxated with a series of straight elevators. Tooth numbers 2 and 3 were then removed with a 53R forceps without complications. Tooth numbers 6, 8, 9, 11, 12, 13, 14, 15 were then removed  with a 150 forceps without complications. Alveoloplasty was then performed utilizing a ronguers and bone file. The surgical site was then irrigated with copious amounts of sterile saline times 4. The tissues were approximated and trimmed  appropriately. The sinus membrane was noted in the area of tooth number 3 and tooth number 13 area. A piece of S[place] was placed in the extraction sites of tooth numbers 2 and 3 as well as tooth numbers 12 through 15. The maxillary right surgical site was then closed primarily from the maxillary right tuberosity and extended to the mesial numbers 8 utilizing 3-0 chromic gut suture in a continuous interrupted suture technique x1. The maxillary left surgical site was then closed primarily from the maxillary left tuberosity and extended to the mesial # 9 utilizing 3-0 chromic gut suture in a continuous interrupted suture technique x1. 2 individual interrupted sutures utilizing 3-0 chromic gut material were then placed to further closed surgical site as needed.  At this point time, the mandibular quadrants were approached. The patient was given bilateral inferior alveolar nerve blocks and long buccal nerve blocks utilizing the bupivacaine with epinephrine. Further infiltration was then achieved utilizing the lidocaine with epinephrine. A 15 blade incision was then made from the distal of number 18 and extended to the distal of #30.  A surgical flap was then carefully reflected.  Lower teeth were then subluxated with a series of straight elevators. Retained roots numbers 19 and 20 as well as tooth numbers 22, 23, 24, 25, 26, 27, 28 were then removed with a 151 forceps without complications.  Alveoloplasty was then performed utilizing a rongeurs and bone file. The tissues were approximated and trimmed appropriately. The surgical sites were then irrigated with copious amounts of sterile saline. The mandibular left surgical site was then closed from the distal of  #18 and extended the mesial numbers 24 utilizing 3-0 chromic gut suture in a continuous interrupted suture technique x1. The mandibular right surgical site was then closed from the distal of #30 and extended to the mesial numbers 25 utilizing 3-0 chromic gut  suture in a continuous interrupted suture technique x1.   At this point time, the entire mouth was irrigated with copious amounts of sterile saline. The patient was examined for complications, seeing none, the dental medicine procedure was deemed to be complete. The throat pack was removed at this time. A series of 4 x 4 gauze were placed in the mouth to aid hemostasis. The patient was then handed over to the anesthesia team for final disposition. After an appropriate amount of time, the patient was extubated and taken to the postanesthsia care unit with stable vital signs and a good condition. All counts were correct for the dental medicine procedure. Patient was given a prescription for appropriate pain medication as well as postop instructions and sinus precautions.  Patient will be scheduled for evaluation of healing and suture removal as indicated.   Lenn Cal, DDS.

## 2013-06-24 NOTE — Progress Notes (Signed)
PRE-OPERATIVE NOTE:  06/24/2013 Derek Morgan [id number]  VITALS: BP 142/92  Pulse 93  Temp(Src) 98.1 F (36.7 C) (Oral)  Resp 18  Ht 6\' 1"  (1.854 m)  Wt 121 lb (54.885 kg)  BMI 15.97 kg/m2  SpO2 99%  Lab Results  Component Value Date   WBC 9.9 06/21/2013   HGB 14.5 06/21/2013   HCT 43.7 06/21/2013   MCV 99.5 06/21/2013   PLT 281 06/21/2013   BMET No results found for this basename: na, k, cl, co2, glucose, bun, creatinine, calcium, gfrnonaa, gfraa    No results found for this basename: INR, PROTIME   No results found for this basename: PTT     .Derek Morgan presents for  multiple dental extractions with alveoloplasty and pre-prosthetic surgery as indicated in  the operating room with general anesthesia.   SUBJECTIVE: The patient denies any acute medical or dental changes and agrees to proceed with treatment as planned.  EXAM: No sign of acute dental changes.  ASSESSMENT: Patient is affected by  chronic periodontitis, chronic apical periodontitis, multiple retained root segments, and dental caries.   PLAN: Patient agrees to proceed with treatment as planned in the operating room as previously discussed and accepts the risks, benefits, complications of the proposed treatment. The patient is aware of the risks for  bleeding, bruising, infection, swelling, pain, root tip fracture, mandible fracture, sinus perforation, nerve damage, and complications associated with the use of general anesthesia with airway compromise. Patient also understands potential risk for complications not mentioned above.    Derek Morgan, DDS

## 2013-06-25 ENCOUNTER — Encounter (HOSPITAL_COMMUNITY): Payer: Self-pay | Admitting: Dentistry

## 2013-06-26 ENCOUNTER — Ambulatory Visit: Payer: BC Managed Care – PPO | Attending: Radiation Oncology

## 2013-06-26 ENCOUNTER — Telehealth: Payer: Self-pay | Admitting: Hematology and Oncology

## 2013-06-26 ENCOUNTER — Telehealth: Payer: Self-pay | Admitting: *Deleted

## 2013-06-26 ENCOUNTER — Ambulatory Visit: Payer: BC Managed Care – PPO

## 2013-06-26 NOTE — Telephone Encounter (Signed)
No additional note

## 2013-06-26 NOTE — Telephone Encounter (Signed)
per alleta chemo edu pt has had chemo before-cnacelled 6/8 pof req appt-Alleta stated will call pt to adv of appt cx-adv i would note the acct

## 2013-06-27 ENCOUNTER — Telehealth: Payer: Self-pay | Admitting: *Deleted

## 2013-06-27 NOTE — Telephone Encounter (Signed)
Called patient to reschedule lab work he missed yesterday.  He stated he was able to come in at 1:00 for labs prior to his 2:00 PET.  I relayed confirmation to Romie Jumper.  Gayleen Orem, RN, BSN, [facility] Head & Neck Oncology Navigator [phone]

## 2013-06-28 ENCOUNTER — Ambulatory Visit
Admission: RE | Admit: 2013-06-28 | Discharge: 2013-06-28 | Disposition: A | Discharge status: Home / Self Care | Payer: BC Managed Care – PPO | Source: Ambulatory Visit | Attending: Radiation Oncology | Admitting: Radiation Oncology

## 2013-06-28 ENCOUNTER — Ambulatory Visit (HOSPITAL_COMMUNITY)
Admission: RE | Admit: 2013-06-28 | Discharge: 2013-06-28 | Disposition: A | Discharge status: Home / Self Care | Payer: BC Managed Care – PPO | Source: Ambulatory Visit | Attending: Hematology and Oncology | Admitting: Hematology and Oncology

## 2013-06-28 DIAGNOSIS — C109 Malignant neoplasm of oropharynx, unspecified: Secondary | ICD-10-CM

## 2013-06-28 DIAGNOSIS — R221 Localized swelling, mass and lump, neck: Secondary | ICD-10-CM

## 2013-06-28 DIAGNOSIS — R918 Other nonspecific abnormal finding of lung field: Secondary | ICD-10-CM | POA: Insufficient documentation

## 2013-06-28 DIAGNOSIS — C329 Malignant neoplasm of larynx, unspecified: Secondary | ICD-10-CM

## 2013-06-28 DIAGNOSIS — R220 Localized swelling, mass and lump, head: Secondary | ICD-10-CM | POA: Insufficient documentation

## 2013-06-28 LAB — BUN AND CREATININE (CC13)
BUN: 13.1 mg/dL (ref 7.0–26.0)
Creatinine: 0.7 mg/dL (ref 0.7–1.3)

## 2013-06-28 LAB — GLUCOSE, CAPILLARY: GLUCOSE-CAPILLARY: 104 mg/dL — AB (ref 70–99)

## 2013-06-28 MED ORDER — FLUDEOXYGLUCOSE F - 18 (FDG) INJECTION
6.8000 | Freq: Once | INTRAVENOUS | Status: AC | PRN
  Administered 2013-06-28: 6.8 via INTRAVENOUS

## 2013-07-01 ENCOUNTER — Ambulatory Visit: Payer: BC Managed Care – PPO

## 2013-07-01 ENCOUNTER — Telehealth: Payer: Self-pay | Admitting: Hematology and Oncology

## 2013-07-01 ENCOUNTER — Other Ambulatory Visit: Payer: Self-pay | Admitting: Hematology and Oncology

## 2013-07-01 ENCOUNTER — Ambulatory Visit: Payer: BC Managed Care – PPO | Admitting: Hematology and Oncology

## 2013-07-01 ENCOUNTER — Ambulatory Visit
Admission: RE | Admit: 2013-07-01 | Payer: BC Managed Care – PPO | Source: Ambulatory Visit | Admitting: Radiation Oncology

## 2013-07-01 ENCOUNTER — Encounter: Payer: Self-pay | Admitting: *Deleted

## 2013-07-01 ENCOUNTER — Encounter: Payer: Self-pay | Admitting: Hematology and Oncology

## 2013-07-01 ENCOUNTER — Ambulatory Visit (HOSPITAL_BASED_OUTPATIENT_CLINIC_OR_DEPARTMENT_OTHER): Payer: BC Managed Care – PPO | Admitting: Hematology and Oncology

## 2013-07-01 ENCOUNTER — Other Ambulatory Visit (HOSPITAL_BASED_OUTPATIENT_CLINIC_OR_DEPARTMENT_OTHER): Payer: BC Managed Care – PPO

## 2013-07-01 VITALS — BP 129/82 | HR 122 | Temp 97.5°F | Resp 18 | Ht 73.0 in | Wt 117.2 lb

## 2013-07-01 DIAGNOSIS — Z720 Tobacco use: Secondary | ICD-10-CM

## 2013-07-01 DIAGNOSIS — C779 Secondary and unspecified malignant neoplasm of lymph node, unspecified: Secondary | ICD-10-CM

## 2013-07-01 DIAGNOSIS — C109 Malignant neoplasm of oropharynx, unspecified: Secondary | ICD-10-CM

## 2013-07-01 DIAGNOSIS — F17200 Nicotine dependence, unspecified, uncomplicated: Secondary | ICD-10-CM

## 2013-07-01 DIAGNOSIS — R070 Pain in throat: Secondary | ICD-10-CM

## 2013-07-01 LAB — COMPREHENSIVE METABOLIC PANEL (CC13)
ALBUMIN: 3.3 g/dL — AB (ref 3.5–5.0)
ALT: 41 U/L (ref 0–55)
ANION GAP: 11 meq/L (ref 3–11)
AST: 32 U/L (ref 5–34)
Alkaline Phosphatase: 66 U/L (ref 40–150)
BILIRUBIN TOTAL: 0.34 mg/dL (ref 0.20–1.20)
BUN: 13.1 mg/dL (ref 7.0–26.0)
CALCIUM: 9.7 mg/dL (ref 8.4–10.4)
CHLORIDE: 96 meq/L — AB (ref 98–109)
CO2: 29 mEq/L (ref 22–29)
Creatinine: 0.8 mg/dL (ref 0.7–1.3)
GLUCOSE: 154 mg/dL — AB (ref 70–140)
POTASSIUM: 4.8 meq/L (ref 3.5–5.1)
Sodium: 136 mEq/L (ref 136–145)
TOTAL PROTEIN: 8 g/dL (ref 6.4–8.3)

## 2013-07-01 LAB — CBC WITH DIFFERENTIAL/PLATELET
BASO%: 0.6 % (ref 0.0–2.0)
Basophils Absolute: 0.1 10*3/uL (ref 0.0–0.1)
EOS%: 0.3 % (ref 0.0–7.0)
Eosinophils Absolute: 0 10*3/uL (ref 0.0–0.5)
HCT: 43.9 % (ref 38.4–49.9)
HGB: 14.7 g/dL (ref 13.0–17.1)
LYMPH#: 1 10*3/uL (ref 0.9–3.3)
LYMPH%: 9.6 % — ABNORMAL LOW (ref 14.0–49.0)
MCH: 32.9 pg (ref 27.2–33.4)
MCHC: 33.4 g/dL (ref 32.0–36.0)
MCV: 98.6 fL — ABNORMAL HIGH (ref 79.3–98.0)
MONO#: 1.2 10*3/uL — ABNORMAL HIGH (ref 0.1–0.9)
MONO%: 10.8 % (ref 0.0–14.0)
NEUT%: 78.7 % — ABNORMAL HIGH (ref 39.0–75.0)
NEUTROS ABS: 8.4 10*3/uL — AB (ref 1.5–6.5)
PLATELETS: 397 10*3/uL (ref 140–400)
RBC: 4.45 10*6/uL (ref 4.20–5.82)
RDW: 13.2 % (ref 11.0–14.6)
WBC: 10.7 10*3/uL — ABNORMAL HIGH (ref 4.0–10.3)

## 2013-07-01 MED ORDER — MORPHINE SULFATE 15 MG PO TABS: 15.0000 mg | ORAL_TABLET | ORAL | Status: DC | PRN

## 2013-07-01 MED ORDER — PROCHLORPERAZINE MALEATE 10 MG PO TABS: 10.0000 mg | ORAL_TABLET | Freq: Four times a day (QID) | ORAL | Status: DC | PRN

## 2013-07-01 MED ORDER — LIDOCAINE-PRILOCAINE 2.5-2.5 % EX CREA: 1.0000 "application " | TOPICAL_CREAM | CUTANEOUS | Status: DC | PRN

## 2013-07-01 MED ORDER — ONDANSETRON HCL 8 MG PO TABS: 8.0000 mg | ORAL_TABLET | Freq: Three times a day (TID) | ORAL | Status: DC | PRN

## 2013-07-01 NOTE — Telephone Encounter (Signed)
gv and printed appt sched and avs for pt fro June and July....pt will come to get another sched on friday after adjustments made per Dr. Alvy Bimler

## 2013-07-01 NOTE — Assessment & Plan Note (Signed)
I spent some time counseling the patient the importance of tobacco cessation. he is currently attempting to quit on his own 

## 2013-07-01 NOTE — Progress Notes (Signed)
Derek Morgan OFFICE PROGRESS NOTE  Patient Care Team: No Pcp Per Patient as PCP - General (General Practice) Heath Lark, MD as Consulting Physician (Hematology and Oncology) Ascencion Dike, MD as Consulting Physician (Otolaryngology) Brooks Sailors, RN as Oncology Nurse Navigator (Oncology)  SUMMARY OF ONCOLOGIC HISTORY:   Oropharyngeal cancer   05/06/2013 Imaging 4.2 x 4.7 x 6.1 cm heterogeneous right neck mass in the right level 2 & level 3 stations. It invades the carotid space, obstructing the internal jugular vein. The primary lesion may be along the inferior aspect of the right palatine tonsil    05/22/2013 Procedure Laryngoscopy revealed abnormalities beneath the right tonsil area. Unfortunately tonsillectomy and right neck biopsy came back benign tissue.   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   06/24/2013 Surgery He underwent multiple extraction of tooth numbers 2, 3, 6, 8, 9, 11, 12, 13, 14, 15, 19, 20, 22, 23, 24, 25, 26, 27, and 28 along with 4 Quadrants of alveoloplasty   06/28/2013 Imaging PET/CT scan results show widespread pulmonary metastasis.    INTERVAL HISTORY: Please see below for problem oriented charting. He complained of severe dental pain from recent dental extraction.  REVIEW OF SYSTEMS:   Constitutional: Denies fevers, chills or abnormal weight loss Eyes: Denies blurriness of vision Ears, nose, mouth, throat, and face: Denies mucositis or sore throat Respiratory: Denies cough, dyspnea or wheezes Cardiovascular: Denies palpitation, chest discomfort or lower extremity swelling Gastrointestinal:  Denies nausea, heartburn or change in bowel habits Skin: Denies abnormal skin rashes Lymphatics: Denies new lymphadenopathy or easy bruising Neurological:Denies numbness, tingling or new weaknesses Behavioral/Psych: Mood is stable, no new changes  All other systems were reviewed with the patient and are negative.  I  have reviewed the past medical history, past surgical history, social history and family history with the patient and they are unchanged from previous note.  ALLERGIES:  has No Known Allergies.  MEDICATIONS:  Current Outpatient Prescriptions  Medication Sig Dispense Refill  . acetaminophen (TYLENOL) 500 MG tablet Take 500 mg by mouth every 6 (six) hours as needed for mild pain.       Marland Kitchen docusate sodium (COLACE) 100 MG capsule Take 1 capsule (100 mg total) by mouth 2 (two) times daily.  60 capsule  0  . fluconazole (DIFLUCAN) 100 MG tablet Take 100-200 mg by mouth daily. Take 2 tablets on day 1, then take 1 tablet daily for 20 days starting on 06/19/13      . morphine (MSIR) 15 MG tablet Take 1 tablet (15 mg total) by mouth every 4 (four) hours as needed for severe pain.  90 tablet  0  . oxyCODONE-acetaminophen (ROXICET) 5-325 MG per tablet Take 1-2 tablets by mouth every 4 (four) hours as needed for moderate pain or severe pain.  30 tablet  0  . lidocaine-prilocaine (EMLA) cream Apply 1 application topically as needed.  30 g  0  . ondansetron (ZOFRAN) 8 MG tablet Take 1 tablet (8 mg total) by mouth every 8 (eight) hours as needed for nausea or vomiting.  30 tablet  1  . prochlorperazine (COMPAZINE) 10 MG tablet Take 1 tablet (10 mg total) by mouth every 6 (six) hours as needed (Nausea or vomiting).  30 tablet  1   No current facility-administered medications for this visit.    PHYSICAL EXAMINATION: ECOG PERFORMANCE STATUS: 1 - Symptomatic but completely ambulatory  Filed Vitals:   07/01/13 1107  BP: 129/82  Pulse:  122  Temp: 97.5 F (36.4 C)  Resp: 18   Filed Weights   07/01/13 1107  Weight: 117 lb 3.2 oz (53.162 kg)    GENERAL:alert, no distress and comfortable. He looks thin SKIN: skin color, texture, turgor are normal, no rashes or significant lesions EYES: normal, Conjunctiva are pink and non-injected, sclera clear OROPHARYNX:no exudate, no erythema and lips, buccal mucosa, and  tongue normal  Musculoskeletal:no cyanosis of digits and no clubbing  NEURO: alert & oriented x 3 with fluent speech, no focal motor/sensory deficits  LABORATORY DATA:  I have reviewed the data as listed    Component Value Date/Time   NA 136 07/01/2013 1210   K 4.8 07/01/2013 1210   CO2 29 07/01/2013 1210   GLUCOSE 154* 07/01/2013 1210   BUN 13.1 07/01/2013 1210   CREATININE 0.8 07/01/2013 1210   CALCIUM 9.7 07/01/2013 1210   PROT 8.0 07/01/2013 1210   ALBUMIN 3.3* 07/01/2013 1210   AST 32 07/01/2013 1210   ALT 41 07/01/2013 1210   ALKPHOS 66 07/01/2013 1210   BILITOT 0.34 07/01/2013 1210    No results found for this basename: SPEP, UPEP,  kappa and lambda light chains    Lab Results  Component Value Date   WBC 10.7* 07/01/2013   NEUTROABS 8.4* 07/01/2013   HGB 14.7 07/01/2013   HCT 43.9 07/01/2013   MCV 98.6* 07/01/2013   PLT 397 07/01/2013      Chemistry      Component Value Date/Time   NA 136 07/01/2013 1210   K 4.8 07/01/2013 1210   CO2 29 07/01/2013 1210   BUN 13.1 07/01/2013 1210   CREATININE 0.8 07/01/2013 1210      Component Value Date/Time   CALCIUM 9.7 07/01/2013 1210   ALKPHOS 66 07/01/2013 1210   AST 32 07/01/2013 1210   ALT 41 07/01/2013 1210   BILITOT 0.34 07/01/2013 1210       RADIOGRAPHIC STUDIES: Reviewed the PET/CT scan with him I have personally reviewed the radiological images as listed and agreed with the findings in the report.   ASSESSMENT & PLAN:  Oropharyngeal cancer We discussed the role of chemotherapy. The intent is for palliative.  We discussed some of the risks, benefits, side-effects of Erbitux/carboplatin & 5FU. Some of the short term side-effects included, though not limited to, including weight loss, life threatening infections, risk of allergic reactions, need for transfusions of blood products, nausea, vomiting, change in bowel habits, loss of hair, admission to hospital for various reasons, and risks of death.   Long term side-effects are also  discussed including risks of infertility, permanent damage to nerve function, hearing loss, chronic fatigue, kidney damage with possibility needing hemodialysis, and rare secondary malignancy including bone marrow disorders.  The patient is aware that the response rates discussed earlier is not guaranteed.  After a long discussion, patient made an informed decision to proceed with the prescribed plan of care and went ahead to sign the consent form today.   Patient education material was dispensed. We discussed the prognosis with and without treatment. Want the patient to think about palliative care and advanced directives. I recommend him to attend chemotherapy education class and I will proceed to start treatment next week, to allow adequate healing time from recent dental extraction and placement of Infuse-a-Port. I plan to see him back a week post treatment to assess for response to treatment. If he documents no response to treatment, then I think palliative radiation therapy would be an option.  Throat pain We discussed about the role of pain management in the oncology clinic. The prescribed pain regimen is for palliative only.  The patient agreed to be compliant with prescribed pain regimen and promised not to share the prescribed medications. Any lost medications or missed prescription will not be refilled sooner than anticipated time when the patient's prescription is expected to run out. We also discussed narcotics refill policy in the clinic.  The patient is educated to check the pill bottles in the middle of the week and ensure there is adequate supply to last through the weekend until next appointment or available business day.  The oncology service has a strict policy not to refill pain medications after business hours or the weekend.  If the patient is found to have violated the verbal agreement as stated no further pain medications will be prescribed in the future.  I refilled his  prescription today and reminded him about potential risks of nausea and constipation.    Tobacco abuse I spent some time counseling the patient the importance of tobacco cessation. he is currently attempting to quit on his own    Orders Placed This Encounter  Procedures  . CBC with Differential    Standing Status: Standing     Number of Occurrences: 33     Standing Expiration Date: 07/02/2014  . Comprehensive metabolic panel    Standing Status: Standing     Number of Occurrences: 33     Standing Expiration Date: 07/02/2014   All questions were answered. The patient knows to call the clinic with any problems, questions or concerns. No barriers to learning was detected.   [facility], [place], MD 07/01/2013 8:53 PM

## 2013-07-01 NOTE — Progress Notes (Signed)
Due to lung metastases revealed on PET scan, Dr. Alvy Bimler and I have made a decision for the patient to undergo induction chemotherapy. Radiotherapy will be held for now. Simulation has been canceled, and I spoke with the patient about the plans for induction chemotherapy. Dr. Alvy Bimler will call the patient later this afternoon to discuss the implications of his PET scan in more detail.  -----------------------------------  Eppie Gibson, MD

## 2013-07-01 NOTE — Progress Notes (Signed)
To provide support and care continuity, met with patient during appt with Dr. Alvy Bimler during which he learned of the lung mets evidenced by his 06/28/13 PET.  Dr. Alvy Bimler explained that his tmts can no longer be considered currative, treatment plan is place RT on hold for the present, proceed with aggressive chemotherapy.  I spent time with patient afterwards, helped him process this news.  I encouraged him to contact me with any questions or needs.  He verbalzied understanding.  I will notifiy LCSW and Chaplain for ASAP follow-up with patient.  Continuing navigation as L1 patient (new patient).  Gayleen Orem, RN, BSN, [facility] Head & Neck Oncology Navigator [phone]

## 2013-07-01 NOTE — Progress Notes (Signed)
Followed-up my clinical encounter with patient with an In-basket note to [employer], LCSW, and [employer], to make them aware of patient's prognosis and change in treatment plan.  Requested their follow-up to provide him support.  Gayleen Orem, RN, BSN, [facility] Head & Neck Oncology Navigator [phone]

## 2013-07-01 NOTE — Assessment & Plan Note (Signed)
We discussed the role of chemotherapy. The intent is for palliative.  We discussed some of the risks, benefits, side-effects of Erbitux/carboplatin & 5FU. Some of the short term side-effects included, though not limited to, including weight loss, life threatening infections, risk of allergic reactions, need for transfusions of blood products, nausea, vomiting, change in bowel habits, loss of hair, admission to hospital for various reasons, and risks of death.   Long term side-effects are also discussed including risks of infertility, permanent damage to nerve function, hearing loss, chronic fatigue, kidney damage with possibility needing hemodialysis, and rare secondary malignancy including bone marrow disorders.  The patient is aware that the response rates discussed earlier is not guaranteed.  After a long discussion, patient made an informed decision to proceed with the prescribed plan of care and went ahead to sign the consent form today.   Patient education material was dispensed. We discussed the prognosis with and without treatment. Want the patient to think about palliative care and advanced directives. I recommend him to attend chemotherapy education class and I will proceed to start treatment next week, to allow adequate healing time from recent dental extraction and placement of Infuse-a-Port. I plan to see him back a week post treatment to assess for response to treatment. If he documents no response to treatment, then I think palliative radiation therapy would be an option.

## 2013-07-01 NOTE — Assessment & Plan Note (Signed)
We discussed about the role of pain management in the oncology clinic. The prescribed pain regimen is for palliative only.  The patient agreed to be compliant with prescribed pain regimen and promised not to share the prescribed medications. Any lost medications or missed prescription will not be refilled sooner than anticipated time when the patient's prescription is expected to run out. We also discussed narcotics refill policy in the clinic.  The patient is educated to check the pill bottles in the middle of the week and ensure there is adequate supply to last through the weekend until next appointment or available business day.  The oncology service has a strict policy not to refill pain medications after business hours or the weekend.  If the patient is found to have violated the verbal agreement as stated no further pain medications will be prescribed in the future.  I refilled his prescription today and reminded him about potential risks of nausea and constipation.

## 2013-07-01 NOTE — Telephone Encounter (Signed)
per MD appt should not have been cx...i added appt back on...done...per MD pt aware

## 2013-07-02 ENCOUNTER — Ambulatory Visit (INDEPENDENT_AMBULATORY_CARE_PROVIDER_SITE_OTHER): Payer: BC Managed Care – PPO | Admitting: General Surgery

## 2013-07-02 ENCOUNTER — Telehealth: Payer: Self-pay | Admitting: *Deleted

## 2013-07-02 ENCOUNTER — Other Ambulatory Visit: Payer: Self-pay

## 2013-07-02 ENCOUNTER — Encounter (INDEPENDENT_AMBULATORY_CARE_PROVIDER_SITE_OTHER): Payer: Self-pay | Admitting: General Surgery

## 2013-07-02 VITALS — BP 110/72 | HR 114 | Temp 97.8°F | Ht 73.0 in | Wt 115.0 lb

## 2013-07-02 DIAGNOSIS — C109 Malignant neoplasm of oropharynx, unspecified: Secondary | ICD-10-CM

## 2013-07-02 NOTE — Progress Notes (Signed)
Patient ID: Derek Morgan, male   DOB: 20-Jul-1955, 58 y.o.   MRN: [id number]  Chief Complaint  Patient presents with  . eval for feeding tube    HPI Derek Morgan is a 58 y.o. male.  We're asked to see the patient in consultation by Dr. Alvy Bimler to evaluate him for a Port-A-Cath. 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 The plan was to treat him with radiation therapy and but he was then found to have metastatic disease to his lungs. The plan now is for him to have chemotherapy and he will need a Port-A-Cath to deliver this. 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 He has been able to drink a liquid diet and get enough calories to sustain himself.  HPI  Past Medical History  Diagnosis Date  . Heavy smoker   . Hoarseness of voice   . Mass in neck 04/2013    right/ Metastatic Squamous Cell Carcinoma  . Loose, teeth     top and bottom  . Weight loss 06/17/2013  . Throat pain 06/17/2013  . Shortness of breath     with exertion at times    Past Surgical History  Procedure Laterality Date  . Broken leg  1998    orif ankle-left-plated  . Direct laryngoscopy N/A 05/22/2013    Procedure: DIRECT LARYNGOSCOPY;  Surgeon: Ascencion Dike, MD;  Location: [facility];  Service: ENT;  Laterality: N/A;  . Tonsillectomy Right 05/22/2013    Procedure: RIGHT TONSILLECTOMY;  Surgeon: Ascencion Dike, MD;  Location: [place];  Service: ENT;  Laterality: Right;  . Mass biopsy Right 05/22/2013    Procedure:  INCISIONAL BIOPSY NECK MASS;  Surgeon: Ascencion Dike, MD;  Location: [place];  Service: ENT;  Laterality: Right;  . Mass biopsy Right 06/07/2013    Procedure: EXCISIONAL BIOPSY OF RIGHT NECK MASS;  Surgeon: Ascencion Dike, MD;  Location: [place];  Service: ENT;  Laterality: Right;  . Multiple extractions with alveoloplasty N/A 06/24/2013    Procedure: Extraction of tooth #'s  2,3,6,8,9,11,12,13,14,15,19,20,22,23,24,25,26,27,28 with alveoloplasty.;  Surgeon: Lenn Cal, DDS;  Location: [place];  Service: Oral Surgery;  Laterality: N/A;    Family History  Problem Relation Age of Onset  . CVA Mother   . CAD Father   . Cancer Father     lung ca    Social History History  Substance Use Topics  . Smoking status: Current Every Day Smoker -- 0.25 packs/day for 40 years    Types: Cigarettes  . Smokeless tobacco: Never Used     Comment: uses e cigarettes  . Alcohol Use: 3.6 oz/week    6 Cans of beer per week     Comment: occl    No Known Allergies  Current Outpatient Prescriptions  Medication Sig Dispense Refill  . acetaminophen (TYLENOL) 500 MG tablet Take 500 mg by mouth every 6 (six) hours as needed for mild pain.       Marland Kitchen docusate sodium (COLACE) 100 MG capsule Take 1 capsule (100 mg total) by mouth 2 (two) times daily.  60 capsule  0  . fluconazole (DIFLUCAN) 100 MG tablet Take 100-200 mg by mouth daily. Take 2 tablets on day 1, then take 1 tablet daily for 20 days starting on 06/19/13      . lidocaine-prilocaine (EMLA) cream Apply 1 application topically as needed.  [place]  g  0  . morphine (MSIR) 15 MG tablet Take 1 tablet (15 mg total) by mouth every 4 (four) hours as needed for severe pain.  90 tablet  0  . ondansetron (ZOFRAN) 8 MG tablet Take 1 tablet (8 mg total) by mouth every 8 (eight) hours as needed for nausea or vomiting.  30 tablet  1  . oxyCODONE-acetaminophen (ROXICET) 5-325 MG per tablet Take 1-2 tablets by mouth every 4 (four) hours as needed for moderate pain or severe pain.  30 tablet  0  . prochlorperazine (COMPAZINE) 10 MG tablet Take 1 tablet (10 mg total) by mouth every 6 (six) hours as needed (Nausea or vomiting).  30 tablet  1   No current facility-administered medications for this visit.    Review of Systems Review of Systems  Constitutional: Positive for unexpected weight change.  HENT: Positive for dental problem, facial  swelling and trouble swallowing.   Eyes: Negative.   Respiratory: Negative.   Cardiovascular: Negative.   Gastrointestinal: Negative.   Endocrine: Negative.   Genitourinary: Negative.   Musculoskeletal: Negative.   Skin: Negative.   Allergic/Immunologic: Negative.   Neurological: Negative.   Hematological: Positive for adenopathy.  Psychiatric/Behavioral: Negative.     Blood pressure 110/72, pulse 114, temperature 97.8 F (36.6 C), height 6\' 1"  (1.854 m), weight 115 lb (52.164 kg).  Physical Exam Physical Exam  Constitutional: He is oriented to person, place, and time.  Cachectic wm in nad  HENT:  Head: Normocephalic and atraumatic.  Eyes: Conjunctivae and EOM are normal. Pupils are equal, round, and reactive to light.  Neck:  Large fixed swelling of right side of neck  Cardiovascular: Normal rate, regular rhythm and normal heart sounds.   Pulmonary/Chest: Effort normal and breath sounds normal.  Abdominal: Bowel sounds are normal.  Firm but nontender  Musculoskeletal: Normal range of motion.  Neurological: He is alert and oriented to person, place, and time.  Skin: Skin is warm and dry.  Psychiatric: He has a normal mood and affect. His behavior is normal.    Data Reviewed As above  Assessment    The patient has metastatic oropharyngeal cancer and will need a Port-A-Cath for chemotherapy. I've discussed with him in detail the risks and benefits of the operation placed the Port-A-Cath as well as some of the technical aspects and he understands and wishes to proceed     Plan    Plan for placement of Port-A-Cath        TOTH III,PAUL S 07/02/2013, 10:30 AM

## 2013-07-02 NOTE — Telephone Encounter (Signed)
Per staff message from MD I have moved appts. I have called patient

## 2013-07-03 ENCOUNTER — Encounter (HOSPITAL_COMMUNITY): Payer: Self-pay | Admitting: *Deleted

## 2013-07-03 MED ORDER — CEFAZOLIN SODIUM-DEXTROSE 2-3 GM-% IV SOLR
2.0000 g | INTRAVENOUS | Status: AC
  Administered 2013-07-04: 2 g via INTRAVENOUS
  Filled 2013-07-03: qty 50

## 2013-07-03 NOTE — Progress Notes (Signed)
Pt denies SOB, chest pain, and being under the care of a cardiologist. Pt denies having a chest x ray and EKG within the last year. Pt denies having a stress test, echo, and cardiac cath. Pt made aware to stop taking Aspirin, vitamins and herbal medications. Do not take any NSAIDs ie: Ibuprofen, Advil, Naproxen or any medication containing Aspirin. 

## 2013-07-04 ENCOUNTER — Ambulatory Visit (HOSPITAL_COMMUNITY)
Admission: RE | Admit: 2013-07-04 | Discharge: 2013-07-04 | Disposition: A | Discharge status: Home / Self Care | Payer: BC Managed Care – PPO | Source: Ambulatory Visit | Attending: General Surgery | Admitting: General Surgery

## 2013-07-04 ENCOUNTER — Encounter (HOSPITAL_COMMUNITY): Payer: Self-pay | Admitting: *Deleted

## 2013-07-04 ENCOUNTER — Ambulatory Visit (HOSPITAL_COMMUNITY): Payer: BC Managed Care – PPO

## 2013-07-04 ENCOUNTER — Ambulatory Visit (HOSPITAL_COMMUNITY): Payer: BC Managed Care – PPO | Admitting: Certified Registered Nurse Anesthetist

## 2013-07-04 ENCOUNTER — Encounter (HOSPITAL_COMMUNITY): Payer: BC Managed Care – PPO | Admitting: Certified Registered Nurse Anesthetist

## 2013-07-04 ENCOUNTER — Encounter (HOSPITAL_COMMUNITY)
Admission: RE | Disposition: A | Discharge status: Home / Self Care | Payer: Self-pay | Source: Ambulatory Visit | Attending: General Surgery

## 2013-07-04 DIAGNOSIS — C109 Malignant neoplasm of oropharynx, unspecified: Secondary | ICD-10-CM

## 2013-07-04 DIAGNOSIS — Z681 Body mass index (BMI) 19 or less, adult: Secondary | ICD-10-CM | POA: Insufficient documentation

## 2013-07-04 DIAGNOSIS — C7800 Secondary malignant neoplasm of unspecified lung: Secondary | ICD-10-CM | POA: Insufficient documentation

## 2013-07-04 DIAGNOSIS — I10 Essential (primary) hypertension: Secondary | ICD-10-CM | POA: Insufficient documentation

## 2013-07-04 DIAGNOSIS — R64 Cachexia: Secondary | ICD-10-CM | POA: Insufficient documentation

## 2013-07-04 DIAGNOSIS — R634 Abnormal weight loss: Secondary | ICD-10-CM | POA: Insufficient documentation

## 2013-07-04 DIAGNOSIS — F17200 Nicotine dependence, unspecified, uncomplicated: Secondary | ICD-10-CM | POA: Insufficient documentation

## 2013-07-04 HISTORY — PX: PORTACATH PLACEMENT: SHX2246

## 2013-07-04 HISTORY — DX: Malignant (primary) neoplasm, unspecified: C80.1

## 2013-07-04 SURGERY — INSERTION, TUNNELED CENTRAL VENOUS DEVICE, WITH PORT
Anesthesia: General | Site: Chest | Laterality: Left

## 2013-07-04 MED ORDER — PROPOFOL 10 MG/ML IV BOLUS
INTRAVENOUS | Status: DC | PRN
  Administered 2013-07-04: 130 mg via INTRAVENOUS
  Administered 2013-07-04: 10 mg via INTRAVENOUS

## 2013-07-04 MED ORDER — FENTANYL CITRATE 0.05 MG/ML IJ SOLN
INTRAMUSCULAR | Status: AC
  Filled 2013-07-04: qty 5

## 2013-07-04 MED ORDER — CHLORHEXIDINE GLUCONATE 4 % EX LIQD
1.0000 "application " | Freq: Once | CUTANEOUS | Status: DC
  Filled 2013-07-04: qty 15

## 2013-07-04 MED ORDER — BUPIVACAINE HCL (PF) 0.25 % IJ SOLN
INTRAMUSCULAR | Status: AC
  Filled 2013-07-04: qty 30

## 2013-07-04 MED ORDER — NEOSTIGMINE METHYLSULFATE 10 MG/10ML IV SOLN
INTRAVENOUS | Status: AC
  Filled 2013-07-04: qty 1

## 2013-07-04 MED ORDER — LACTATED RINGERS IV SOLN
INTRAVENOUS | Status: DC | PRN
  Administered 2013-07-04: 12:00:00 via INTRAVENOUS

## 2013-07-04 MED ORDER — MIDAZOLAM HCL 5 MG/5ML IJ SOLN
INTRAMUSCULAR | Status: DC | PRN
  Administered 2013-07-04: 2 mg via INTRAVENOUS

## 2013-07-04 MED ORDER — LIDOCAINE HCL (CARDIAC) 20 MG/ML IV SOLN
INTRAVENOUS | Status: DC | PRN
  Administered 2013-07-04: 60 mg via INTRAVENOUS

## 2013-07-04 MED ORDER — DEXAMETHASONE SODIUM PHOSPHATE 4 MG/ML IJ SOLN
INTRAMUSCULAR | Status: AC
  Filled 2013-07-04: qty 2

## 2013-07-04 MED ORDER — FENTANYL CITRATE 0.05 MG/ML IJ SOLN
INTRAMUSCULAR | Status: DC | PRN
  Administered 2013-07-04: 100 ug via INTRAVENOUS
  Administered 2013-07-04: 50 ug via INTRAVENOUS

## 2013-07-04 MED ORDER — ONDANSETRON HCL 4 MG/2ML IJ SOLN
INTRAMUSCULAR | Status: DC | PRN
  Administered 2013-07-04: 4 mg via INTRAVENOUS

## 2013-07-04 MED ORDER — BUPIVACAINE HCL (PF) 0.25 % IJ SOLN
INTRAMUSCULAR | Status: DC | PRN
  Administered 2013-07-04: 9 mL

## 2013-07-04 MED ORDER — ROCURONIUM BROMIDE 50 MG/5ML IV SOLN
INTRAVENOUS | Status: AC
  Filled 2013-07-04: qty 1

## 2013-07-04 MED ORDER — ARTIFICIAL TEARS OP OINT: TOPICAL_OINTMENT | OPHTHALMIC | Status: DC | PRN

## 2013-07-04 MED ORDER — MIDAZOLAM HCL 2 MG/2ML IJ SOLN
INTRAMUSCULAR | Status: AC
  Filled 2013-07-04: qty 2

## 2013-07-04 MED ORDER — ONDANSETRON HCL 4 MG/2ML IJ SOLN
INTRAMUSCULAR | Status: AC
  Filled 2013-07-04: qty 2

## 2013-07-04 MED ORDER — LIDOCAINE HCL (CARDIAC) 20 MG/ML IV SOLN
INTRAVENOUS | Status: AC
  Filled 2013-07-04: qty 5

## 2013-07-04 MED ORDER — ARTIFICIAL TEARS OP OINT
TOPICAL_OINTMENT | OPHTHALMIC | Status: AC
  Filled 2013-07-04: qty 3.5

## 2013-07-04 MED ORDER — HEPARIN SOD (PORK) LOCK FLUSH 100 UNIT/ML IV SOLN
INTRAVENOUS | Status: DC | PRN
  Administered 2013-07-04: 500 [IU]

## 2013-07-04 MED ORDER — PROPOFOL 10 MG/ML IV BOLUS
INTRAVENOUS | Status: AC
  Filled 2013-07-04: qty 20

## 2013-07-04 MED ORDER — LIDOCAINE HCL (PF) 1 % IJ SOLN
INTRAMUSCULAR | Status: AC
  Filled 2013-07-04: qty 30

## 2013-07-04 MED ORDER — SODIUM CHLORIDE 0.9 % IR SOLN
Status: DC | PRN
  Administered 2013-07-04: 12:00:00

## 2013-07-04 MED ORDER — HEPARIN SOD (PORK) LOCK FLUSH 100 UNIT/ML IV SOLN
INTRAVENOUS | Status: AC
  Filled 2013-07-04: qty 5

## 2013-07-04 MED ORDER — DEXAMETHASONE SODIUM PHOSPHATE 4 MG/ML IJ SOLN
INTRAMUSCULAR | Status: DC | PRN
  Administered 2013-07-04: 8 mg via INTRAVENOUS

## 2013-07-04 MED ORDER — GLYCOPYRROLATE 0.2 MG/ML IJ SOLN
INTRAMUSCULAR | Status: AC
  Filled 2013-07-04: qty 2

## 2013-07-04 MED ORDER — OXYCODONE-ACETAMINOPHEN 5-325 MG PO TABS: 1.0000 | ORAL_TABLET | ORAL | Status: DC | PRN

## 2013-07-04 MED ORDER — SUCCINYLCHOLINE CHLORIDE 20 MG/ML IJ SOLN
INTRAMUSCULAR | Status: DC | PRN
  Administered 2013-07-04: 50 mg via INTRAVENOUS

## 2013-07-04 MED ORDER — SUCCINYLCHOLINE CHLORIDE 20 MG/ML IJ SOLN
INTRAMUSCULAR | Status: AC
  Filled 2013-07-04: qty 1

## 2013-07-04 MED ORDER — LACTATED RINGERS IV SOLN
INTRAVENOUS | Status: DC
  Administered 2013-07-04: 10:00:00 via INTRAVENOUS

## 2013-07-04 SURGICAL SUPPLY — 55 items
ADH SKN CLS APL DERMABOND .7 (GAUZE/BANDAGES/DRESSINGS) ×1
BAG DECANTER FOR FLEXI CONT (MISCELLANEOUS) ×3 IMPLANT
CHLORAPREP W/TINT 10.5 ML (MISCELLANEOUS) ×1 IMPLANT
CHLORAPREP W/TINT 26ML (MISCELLANEOUS) ×2 IMPLANT
COVER SURGICAL LIGHT HANDLE (MISCELLANEOUS) ×3 IMPLANT
CRADLE DONUT ADULT HEAD (MISCELLANEOUS) ×3 IMPLANT
DECANTER SPIKE VIAL GLASS SM (MISCELLANEOUS) ×1 IMPLANT
DERMABOND ADVANCED (GAUZE/BANDAGES/DRESSINGS) ×2
DERMABOND ADVANCED .7 DNX12 (GAUZE/BANDAGES/DRESSINGS) ×1 IMPLANT
DRAPE C-ARM 42X72 X-RAY (DRAPES) ×3 IMPLANT
DRAPE CHEST BREAST 15X10 FENES (DRAPES) ×3 IMPLANT
DRAPE UTILITY 15X26 W/TAPE STR (DRAPE) ×6 IMPLANT
ELECT CAUTERY BLADE 6.4 (BLADE) ×3 IMPLANT
ELECT REM PT RETURN 9FT ADLT (ELECTROSURGICAL) ×3
ELECTRODE REM PT RTRN 9FT ADLT (ELECTROSURGICAL) ×1 IMPLANT
GAUZE SPONGE 4X4 16PLY XRAY LF (GAUZE/BANDAGES/DRESSINGS) ×3 IMPLANT
GLOVE BIO SURGEON STRL SZ7.5 (GLOVE) ×7 IMPLANT
GLOVE BIOGEL PI IND STRL 6.5 (GLOVE) IMPLANT
GLOVE BIOGEL PI IND STRL 7.5 (GLOVE) IMPLANT
GLOVE BIOGEL PI INDICATOR 6.5 (GLOVE) ×4
GLOVE BIOGEL PI INDICATOR 7.5 (GLOVE) ×4
GLOVE SURG SS PI 6.5 STRL IVOR (GLOVE) ×2 IMPLANT
GOWN STRL REUS W/ TWL LRG LVL3 (GOWN DISPOSABLE) ×2 IMPLANT
GOWN STRL REUS W/ TWL XL LVL3 (GOWN DISPOSABLE) IMPLANT
GOWN STRL REUS W/TWL LRG LVL3 (GOWN DISPOSABLE) ×15
GOWN STRL REUS W/TWL XL LVL3 (GOWN DISPOSABLE) ×3
INTRODUCER COOK 11FR (CATHETERS) IMPLANT
KIT BASIN OR (CUSTOM PROCEDURE TRAY) ×3 IMPLANT
KIT PORT POWER 8FR ISP CVUE (Catheter) ×2 IMPLANT
KIT PORT POWER 9.6FR MRI PREA (Catheter) IMPLANT
KIT PORT POWER ISP 8FR (Catheter) IMPLANT
KIT POWER CATH 8FR (Catheter) IMPLANT
KIT ROOM TURNOVER OR (KITS) ×3 IMPLANT
NDL HYPO 25GX1X1/2 BEV (NEEDLE) ×1 IMPLANT
NEEDLE 22X1 1/2 (OR ONLY) (NEEDLE) IMPLANT
NEEDLE HYPO 25GX1X1/2 BEV (NEEDLE) ×3 IMPLANT
NS IRRIG 1000ML POUR BTL (IV SOLUTION) ×3 IMPLANT
PACK SURGICAL SETUP 50X90 (CUSTOM PROCEDURE TRAY) ×3 IMPLANT
PAD ARMBOARD 7.5X6 YLW CONV (MISCELLANEOUS) ×3 IMPLANT
PENCIL BUTTON HOLSTER BLD 10FT (ELECTRODE) ×3 IMPLANT
SET INTRODUCER 12FR PACEMAKER (SHEATH) IMPLANT
SET SHEATH INTRODUCER 10FR (MISCELLANEOUS) IMPLANT
SHEATH COOK PEEL AWAY SET 9F (SHEATH) IMPLANT
SUT MNCRL AB 4-0 PS2 18 (SUTURE) ×3 IMPLANT
SUT PROLENE 2 0 SH 30 (SUTURE) ×6 IMPLANT
SUT SILK 2 0 (SUTURE)
SUT SILK 2-0 18XBRD TIE 12 (SUTURE) IMPLANT
SUT VIC AB 3-0 SH 27 (SUTURE) ×3
SUT VIC AB 3-0 SH 27XBRD (SUTURE) ×1 IMPLANT
SYR 20ML ECCENTRIC (SYRINGE) ×6 IMPLANT
SYR 5ML LUER SLIP (SYRINGE) ×3 IMPLANT
SYR CONTROL 10ML LL (SYRINGE) ×3 IMPLANT
SYRINGE 10CC LL (SYRINGE) IMPLANT
TOWEL OR 17X24 6PK STRL BLUE (TOWEL DISPOSABLE) ×3 IMPLANT
TOWEL OR 17X26 10 PK STRL BLUE (TOWEL DISPOSABLE) ×3 IMPLANT

## 2013-07-04 NOTE — Anesthesia Postprocedure Evaluation (Signed)
  Anesthesia Post-op Note  Patient: Derek Morgan  Procedure(s) Performed: Procedure(s): INSERTION PORT-A-CATH (Left)  Patient Location: PACU  Anesthesia Type:General  Level of Consciousness: awake and alert   Airway and Oxygen Therapy: Patient Spontanous Breathing  Post-op Pain: none  Post-op Assessment: Post-op Vital signs reviewed, Patient's Cardiovascular Status Stable, Respiratory Function Stable, Patent Airway, No signs of Nausea or vomiting and Pain level controlled  Post-op Vital Signs: Reviewed and stable  Last Vitals:  Filed Vitals:   07/04/13 1318  BP: 149/77  Pulse: 66  Temp: 37.3 C  Resp: 16    Complications: No apparent anesthesia complications

## 2013-07-04 NOTE — Discharge Instructions (Signed)

## 2013-07-04 NOTE — Interval H&P Note (Signed)
History and Physical Interval Note:  07/04/2013 11:19 AM  Derek Morgan  has presented today for surgery, with the diagnosis of oropharangeal cancer  The various methods of treatment have been discussed with the patient and family. After consideration of risks, benefits and other options for treatment, the patient has consented to  Procedure(s): INSERTION PORT-A-CATH (N/A) as a surgical intervention .  The patient's history has been reviewed, patient examined, no change in status, stable for surgery.  I have reviewed the patient's chart and labs.  Questions were answered to the patient's satisfaction.     TOTH III,PAUL S

## 2013-07-04 NOTE — H&P (View-Only) (Signed)
Patient ID: Derek Morgan, male   DOB: May 07, 1955, 58 y.o.   MRN: [id number]  Chief Complaint  Patient presents with  . eval for feeding tube    HPI Derek Morgan is a 58 y.o. male.  We're asked to see the patient in consultation by Dr. Alvy Bimler to evaluate him for a Port-A-Cath. 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 The plan was to treat him with radiation therapy and but he was then found to have metastatic disease to his lungs. The plan now is for him to have chemotherapy and he will need a Port-A-Cath to deliver this. 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 He has been able to drink a liquid diet and get enough calories to sustain himself.  HPI  Past Medical History  Diagnosis Date  . Heavy smoker   . Hoarseness of voice   . Mass in neck 04/2013    right/ Metastatic Squamous Cell Carcinoma  . Loose, teeth     top and bottom  . Weight loss 06/17/2013  . Throat pain 06/17/2013  . Shortness of breath     with exertion at times    Past Surgical History  Procedure Laterality Date  . Broken leg  1998    orif ankle-left-plated  . Direct laryngoscopy N/A 05/22/2013    Procedure: DIRECT LARYNGOSCOPY;  Surgeon: Ascencion Dike, MD;  Location: [facility];  Service: ENT;  Laterality: N/A;  . Tonsillectomy Right 05/22/2013    Procedure: RIGHT TONSILLECTOMY;  Surgeon: Ascencion Dike, MD;  Location: [place];  Service: ENT;  Laterality: Right;  . Mass biopsy Right 05/22/2013    Procedure:  INCISIONAL BIOPSY NECK MASS;  Surgeon: Ascencion Dike, MD;  Location: [place];  Service: ENT;  Laterality: Right;  . Mass biopsy Right 06/07/2013    Procedure: EXCISIONAL BIOPSY OF RIGHT NECK MASS;  Surgeon: Ascencion Dike, MD;  Location: [place];  Service: ENT;  Laterality: Right;  . Multiple extractions with alveoloplasty N/A 06/24/2013    Procedure: Extraction of tooth #'s  2,3,6,8,9,11,12,13,14,15,19,20,22,23,24,25,26,27,28 with alveoloplasty.;  Surgeon: Lenn Cal, DDS;  Location: [place];  Service: Oral Surgery;  Laterality: N/A;    Family History  Problem Relation Age of Onset  . CVA Mother   . CAD Father   . Cancer Father     lung ca    Social History History  Substance Use Topics  . Smoking status: Current Every Day Smoker -- 0.25 packs/day for 40 years    Types: Cigarettes  . Smokeless tobacco: Never Used     Comment: uses e cigarettes  . Alcohol Use: 3.6 oz/week    6 Cans of beer per week     Comment: occl    No Known Allergies  Current Outpatient Prescriptions  Medication Sig Dispense Refill  . acetaminophen (TYLENOL) 500 MG tablet Take 500 mg by mouth every 6 (six) hours as needed for mild pain.       Marland Kitchen docusate sodium (COLACE) 100 MG capsule Take 1 capsule (100 mg total) by mouth 2 (two) times daily.  60 capsule  0  . fluconazole (DIFLUCAN) 100 MG tablet Take 100-200 mg by mouth daily. Take 2 tablets on day 1, then take 1 tablet daily for 20 days starting on 06/19/13      . lidocaine-prilocaine (EMLA) cream Apply 1 application topically as needed.  [place]  g  0  . morphine (MSIR) 15 MG tablet Take 1 tablet (15 mg total) by mouth every 4 (four) hours as needed for severe pain.  90 tablet  0  . ondansetron (ZOFRAN) 8 MG tablet Take 1 tablet (8 mg total) by mouth every 8 (eight) hours as needed for nausea or vomiting.  30 tablet  1  . oxyCODONE-acetaminophen (ROXICET) 5-325 MG per tablet Take 1-2 tablets by mouth every 4 (four) hours as needed for moderate pain or severe pain.  30 tablet  0  . prochlorperazine (COMPAZINE) 10 MG tablet Take 1 tablet (10 mg total) by mouth every 6 (six) hours as needed (Nausea or vomiting).  30 tablet  1   No current facility-administered medications for this visit.    Review of Systems Review of Systems  Constitutional: Positive for unexpected weight change.  HENT: Positive for dental problem, facial  swelling and trouble swallowing.   Eyes: Negative.   Respiratory: Negative.   Cardiovascular: Negative.   Gastrointestinal: Negative.   Endocrine: Negative.   Genitourinary: Negative.   Musculoskeletal: Negative.   Skin: Negative.   Allergic/Immunologic: Negative.   Neurological: Negative.   Hematological: Positive for adenopathy.  Psychiatric/Behavioral: Negative.     Blood pressure 110/72, pulse 114, temperature 97.8 F (36.6 C), height 6\' 1"  (1.854 m), weight 115 lb (52.164 kg).  Physical Exam Physical Exam  Constitutional: He is oriented to person, place, and time.  Cachectic wm in nad  HENT:  Head: Normocephalic and atraumatic.  Eyes: Conjunctivae and EOM are normal. Pupils are equal, round, and reactive to light.  Neck:  Large fixed swelling of right side of neck  Cardiovascular: Normal rate, regular rhythm and normal heart sounds.   Pulmonary/Chest: Effort normal and breath sounds normal.  Abdominal: Bowel sounds are normal.  Firm but nontender  Musculoskeletal: Normal range of motion.  Neurological: He is alert and oriented to person, place, and time.  Skin: Skin is warm and dry.  Psychiatric: He has a normal mood and affect. His behavior is normal.    Data Reviewed As above  Assessment    The patient has metastatic oropharyngeal cancer and will need a Port-A-Cath for chemotherapy. I've discussed with him in detail the risks and benefits of the operation placed the Port-A-Cath as well as some of the technical aspects and he understands and wishes to proceed     Plan    Plan for placement of Port-A-Cath        TOTH III,PAUL S 07/02/2013, 10:30 AM

## 2013-07-04 NOTE — Op Note (Signed)
07/04/2013  12:40 PM  PATIENT:  Derek Morgan  58 y.o. male  PRE-OPERATIVE DIAGNOSIS:  OROPHARYNGEAL CANCER  POST-OPERATIVE DIAGNOSIS:  OROPHARYNGEAL CANCER  PROCEDURE:  Procedure(s): INSERTION PORT-A-CATH (Left)subclavian vein  SURGEON:  Surgeon(s) and Role:    * Merrie Roof, MD - Primary  PHYSICIAN ASSISTANT:   ASSISTANTS: none   ANESTHESIA:   general  EBL:     BLOOD ADMINISTERED:none  DRAINS: none   LOCAL MEDICATIONS USED:  MARCAINE     SPECIMEN:  No Specimen  DISPOSITION OF SPECIMEN:  N/A  COUNTS:  YES  TOURNIQUET:  * No tourniquets in log *  DICTATION: .Dragon Dictation After informed consent was obtained the patient was brought to the operating room placed in the supine position after table. After adequate induction of general anesthesia the patient's chest and neck area were prepped with ChloraPrep, allowed to dry, and draped in usual sterile manner. Prior to this a roll was placed between the patient's shoulder blades to extend the shoulder slightly. Next the patient was placed in Trendelenburg position. The area lateral to the bend of the clavicle on the left chest was infiltrated with quarter percent [place]. A small incision was made with a 15 blade knife lateral to the bend of the clavicle. A large bore finder needle from the Port-A-Cath kit was then used to slide beneath the clavicle on the left chest heading towards the sternal notch and in doing so we were able to access the left subclavian vein without difficulty. A wire was fed through the needle using the Seldinger technique without difficulty. The wire was confirmed in the central venous system using real-time fluoroscopy. Next the incision on the left chest was extended medially and laterally a short distance. A subcutaneous pocket was created inferior to the incision using blunt finger dissection and some sharp dissection with the electrocautery. Next the tubing was placed on the reservoir and the  reservoir was placed in the pocket and the length of the tubing was estimated using real-time fluoroscopy. The tubing length was cut to the appropriate length. Next the sheath and dilator were placed over the wire also using the Seldinger technique without difficulty. The dilator and wire were then removed. The tubing was fed through the sheath as far as it can be fed and held in place while the sheath was gently cracked and separated. Another real-time fluoroscopy image showed the tip of the catheter to be in the superior vena cava. A permanent anchor was then used to permanently attach the tubing to the reservoir. The reservoir was then anchored in the pocket using 2 2-0 Prolene stitches. The port was then aspirated and it aspirated blood easily. The port was then flushed with initially with dilute heparin solution and then a more concentrated heparin solution. The subcutaneous tissue was then closed over the port with interrupted 0 Vicryl stitches. The skin was then closed with a running 4-0 Monocryl subcuticular stitch. Dermabond dressings were applied. The patient tolerated the procedure well. At the end of the case all needle sponge and instrument counts were correct. The patient was then awakened and taken to recovery in stable condition.  PLAN OF CARE: Discharge to home after PACU  PATIENT DISPOSITION:  PACU - hemodynamically stable.   Delay start of Pharmacological VTE agent (>24hrs) due to surgical blood loss or risk of bleeding: not applicable

## 2013-07-04 NOTE — Transfer of Care (Signed)
Immediate Anesthesia Transfer of Care Note  Patient: Derek Morgan  Procedure(s) Performed: Procedure(s): INSERTION PORT-A-CATH (Left)  Patient Location: PACU  Anesthesia Type:General  Level of Consciousness: awake, alert , oriented and patient cooperative  Airway & Oxygen Therapy: Patient Spontanous Breathing and Patient connected to nasal cannula oxygen  Post-op Assessment: Report given to PACU RN, Post -op Vital signs reviewed and stable and Patient moving all extremities X 4  Post vital signs: Reviewed and stable  Complications: No apparent anesthesia complications

## 2013-07-04 NOTE — Anesthesia Preprocedure Evaluation (Signed)
Anesthesia Evaluation  Patient identified by MRN, date of birth, ID band Patient awake    Reviewed: Allergy & Precautions, H&P , NPO status , Patient's Chart, lab work & pertinent test results  History of Anesthesia Complications (+) history of anesthetic complications  Airway Mallampati: III TM Distance: <3 FB Neck ROM: Limited    Dental  (+) Edentulous Upper, Edentulous Lower   Pulmonary shortness of breath and with exertion, Current Smoker,          Cardiovascular hypertension,     Neuro/Psych negative neurological ROS  negative psych ROS   GI/Hepatic negative GI ROS, Neg liver ROS,   Endo/Other  negative endocrine ROS  Renal/GU negative Renal ROS     Musculoskeletal   Abdominal   Peds  Hematology   Anesthesia Other Findings Oropharyngeal cancer History of video laryngoscopy for intubation.    Reproductive/Obstetrics                           Anesthesia Physical Anesthesia Plan  ASA: III  Anesthesia Plan: General   Post-op Pain Management:    Induction:   Airway Management Planned: Oral ETT  Additional Equipment:   Intra-op Plan:   Post-operative Plan: Extubation in OR  Informed Consent: I have reviewed the patients History and Physical, chart, labs and discussed the procedure including the risks, benefits and alternatives for the proposed anesthesia with the patient or authorized representative who has indicated his/her understanding and acceptance.   Dental advisory given  Plan Discussed with:   Anesthesia Plan Comments:         Anesthesia Quick Evaluation

## 2013-07-05 ENCOUNTER — Other Ambulatory Visit: Payer: BC Managed Care – PPO

## 2013-07-05 ENCOUNTER — Other Ambulatory Visit: Payer: Self-pay | Admitting: *Deleted

## 2013-07-07 ENCOUNTER — Encounter (HOSPITAL_COMMUNITY): Payer: Self-pay | Admitting: General Surgery

## 2013-07-08 ENCOUNTER — Encounter (HOSPITAL_COMMUNITY): Payer: Self-pay | Admitting: Dentistry

## 2013-07-08 ENCOUNTER — Encounter: Payer: Self-pay | Admitting: Hematology and Oncology

## 2013-07-08 ENCOUNTER — Ambulatory Visit (HOSPITAL_COMMUNITY): Payer: Medicaid - Dental | Admitting: Dentistry

## 2013-07-08 VITALS — BP 126/81 | HR 105 | Temp 97.6°F

## 2013-07-08 DIAGNOSIS — K08109 Complete loss of teeth, unspecified cause, unspecified class: Secondary | ICD-10-CM

## 2013-07-08 DIAGNOSIS — Z0189 Encounter for other specified special examinations: Secondary | ICD-10-CM

## 2013-07-08 DIAGNOSIS — K0820 Unspecified atrophy of edentulous alveolar ridge: Secondary | ICD-10-CM

## 2013-07-08 DIAGNOSIS — K000 Anodontia: Secondary | ICD-10-CM

## 2013-07-08 DIAGNOSIS — K08404 Partial loss of teeth, unspecified cause, class IV: Secondary | ICD-10-CM

## 2013-07-08 DIAGNOSIS — K08409 Partial loss of teeth, unspecified cause, unspecified class: Secondary | ICD-10-CM

## 2013-07-08 DIAGNOSIS — C109 Malignant neoplasm of oropharynx, unspecified: Secondary | ICD-10-CM

## 2013-07-08 NOTE — Progress Notes (Signed)
POST OPERATIVE NOTE:  07/08/2013 Derek Morgan [id number]  VITALS: BP 126/81  Pulse 105  Temp(Src) 97.6 F (36.4 C) (Oral)  LABS:  Lab Results  Component Value Date   WBC 10.7* 07/01/2013   HGB 14.7 07/01/2013   HCT 43.9 07/01/2013   MCV 98.6* 07/01/2013   PLT 397 07/01/2013   BMET    Component Value Date/Time   NA 136 07/01/2013 1210   K 4.8 07/01/2013 1210   CO2 29 07/01/2013 1210   GLUCOSE 154* 07/01/2013 1210   BUN 13.1 07/01/2013 1210   CREATININE 0.8 07/01/2013 1210   CALCIUM 9.7 07/01/2013 1210    No results found for this basename: INR, PROTIME   No results found for this basename: PTT     Derek Morgan is status post extraction of remaining teeth with alveoloplasty and pre-prosthetic surgery as indicated in the operating room on 06/24/2013.  SUBJECTIVE: The patient denies having any significant oral discomfort. The patient indicates that a few stitches still remain.   EXAM: There is no sign of infection, heme, or ooze. Patient is now edentulous. The patient is healing in by generalized primary closure but several maxillary molar extraction sites are healing in by secondary intention. There is no evidence of oral antral fistula. A Valsalva maneuver was negative for symptoms.  PROCEDURE: The patient was given a chlorhexidine gluconate rinse for 30 seconds. Sutures were then removed without complication. Patient tolerated the procedure well.  ASSESSMENT: Post operative course is consistent with dental procedures performed in the operating room on 06/24/2013. Patient is edentulous. 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  PLAN: 1. Continue saltwater rinses as needed to aid healing. 2. Brush tongue daily. 3. Continue sinus perforation precautions as instructed. 4. Advance diet as tolerated. Use nutritional supplements as instructed. 5. Patient is to follow the dentist of his choice for fabrication of upper lower complete dentures 3 months after the last  radiation therapy has been completed 6. Patient is cleared for chemoradiation therapy at this time.   Lenn Cal, DDS

## 2013-07-08 NOTE — Progress Notes (Signed)
Put disability form on nurse's desk. °

## 2013-07-08 NOTE — Patient Instructions (Signed)
PLAN: 1. Continue saltwater rinses as needed to aid healing. 2. Brush tongue daily. 3. Continue sinus perforation precautions as instructed. 4. Advance diet as tolerated. Use nutritional supplements as instructed. 5. Patient is to follow the dentist of his choice for fabrication of upper lower complete dentures 3 months after the last radiation therapy has been completed 6. Patient is cleared for chemoradiation therapy at this time.   Lenn Cal, DDS

## 2013-07-09 ENCOUNTER — Encounter: Payer: Self-pay | Admitting: Hematology and Oncology

## 2013-07-09 NOTE — Progress Notes (Signed)
Put disability form in registration desk. °

## 2013-07-10 ENCOUNTER — Encounter: Payer: Self-pay | Admitting: Nutrition

## 2013-07-10 ENCOUNTER — Ambulatory Visit: Payer: Self-pay

## 2013-07-11 ENCOUNTER — Encounter: Payer: Self-pay | Admitting: *Deleted

## 2013-07-11 ENCOUNTER — Ambulatory Visit (HOSPITAL_BASED_OUTPATIENT_CLINIC_OR_DEPARTMENT_OTHER): Payer: BC Managed Care – PPO

## 2013-07-11 ENCOUNTER — Ambulatory Visit: Payer: BC Managed Care – PPO | Admitting: Nutrition

## 2013-07-11 VITALS — BP 131/89 | HR 103 | Temp 98.2°F | Resp 18

## 2013-07-11 DIAGNOSIS — C109 Malignant neoplasm of oropharynx, unspecified: Secondary | ICD-10-CM

## 2013-07-11 DIAGNOSIS — Z5111 Encounter for antineoplastic chemotherapy: Secondary | ICD-10-CM

## 2013-07-11 DIAGNOSIS — Z5112 Encounter for antineoplastic immunotherapy: Secondary | ICD-10-CM

## 2013-07-11 MED ORDER — SODIUM CHLORIDE 0.9 % IV SOLN
508.5000 mg | Freq: Once | INTRAVENOUS | Status: AC
  Administered 2013-07-11: 510 mg via INTRAVENOUS
  Filled 2013-07-11: qty 51

## 2013-07-11 MED ORDER — SODIUM CHLORIDE 0.9 % IV SOLN
Freq: Once | INTRAVENOUS | Status: AC
  Administered 2013-07-11: 10:00:00 via INTRAVENOUS

## 2013-07-11 MED ORDER — ONDANSETRON 16 MG/50ML IVPB (CHCC)
INTRAVENOUS | Status: AC
  Filled 2013-07-11: qty 16

## 2013-07-11 MED ORDER — CETUXIMAB CHEMO IV INJECTION 200 MG/100ML
400.0000 mg/m2 | Freq: Once | INTRAVENOUS | Status: AC
  Administered 2013-07-11: 700 mg via INTRAVENOUS
  Filled 2013-07-11: qty 350

## 2013-07-11 MED ORDER — SODIUM CHLORIDE 0.9 % IV SOLN
1000.0000 mg/m2/d | INTRAVENOUS | Status: DC
  Administered 2013-07-11: 6650 mg via INTRAVENOUS
  Filled 2013-07-11: qty 133

## 2013-07-11 MED ORDER — DEXAMETHASONE SODIUM PHOSPHATE 20 MG/5ML IJ SOLN
INTRAMUSCULAR | Status: AC
  Filled 2013-07-11: qty 5

## 2013-07-11 MED ORDER — ONDANSETRON 16 MG/50ML IVPB (CHCC)
16.0000 mg | Freq: Once | INTRAVENOUS | Status: AC
  Administered 2013-07-11: 16 mg via INTRAVENOUS

## 2013-07-11 MED ORDER — DIPHENHYDRAMINE HCL 50 MG/ML IJ SOLN
50.0000 mg | Freq: Once | INTRAMUSCULAR | Status: AC
  Administered 2013-07-11: 50 mg via INTRAVENOUS

## 2013-07-11 MED ORDER — DEXAMETHASONE SODIUM PHOSPHATE 20 MG/5ML IJ SOLN
20.0000 mg | Freq: Once | INTRAMUSCULAR | Status: AC
  Administered 2013-07-11: 20 mg via INTRAVENOUS

## 2013-07-11 MED ORDER — DIPHENHYDRAMINE HCL 50 MG/ML IJ SOLN
INTRAMUSCULAR | Status: AC
  Filled 2013-07-11: qty 1

## 2013-07-11 NOTE — Progress Notes (Signed)
Nutrition followup completed with patient in chemotherapy.  Patient is treated for tonsil cancer.  Last weight documented was on June 18.  Patient weighed 120 pounds.  Patient reports it is difficult for him to eat foods but he does consume smooth, soft foods.  He will drink 3-4 boost plus a day.  Patient denies other nutrition issues.  Nutrition diagnosis: food and nutrition related knowledge deficit and inadequate oral intake continue.  Intervention: Patient again educated to consume high-calorie, high-protein, soft foods throughout the day and increase boost plus 5 times a day.  Provided patient with coupons, and samples.  Teach back method used.  Monitoring, evaluation, goals: Patient will tolerate increased calories and protein to promote weight maintenance.   Next visit: Thursday, July 2, during chemotherapy.

## 2013-07-11 NOTE — Progress Notes (Signed)
To provide support, encouragement and care continuity, met with patient during first chemo.  He asked about the status of the disability forms he delivered to Dr. Alvy Bimler, I determined that completed forms were ready for pick-up at receptionist desk, I retrieved and delivered to him in Infusion, he expressed appreciation.  He denied any further needs/concerns at this time, I encouraged him to call me if that changes, he stated he would.  Continuing navigation as L1 patient (new patient).  Gayleen Orem, RN, BSN, [facility] Navigator [phone]

## 2013-07-11 NOTE — Patient Instructions (Signed)
[facility] Discharge Instructions for Patients Receiving Chemotherapy  Today you received the following chemotherapy agents: Erbitux, Carboplatin, 5FU   To help prevent nausea and vomiting after your treatment, we encourage you to take your nausea medication as prescribed. You may take Compazine every 6 hours as needed for nausea beginning now. You may take Zofran every 8 hours as needed for nausea beginning this evening.    If you develop nausea and vomiting that is not controlled by your nausea medication, call the clinic.   BELOW ARE SYMPTOMS THAT SHOULD BE REPORTED IMMEDIATELY:  *FEVER GREATER THAN 100.5 F  *CHILLS WITH OR WITHOUT FEVER  NAUSEA AND VOMITING THAT IS NOT CONTROLLED WITH YOUR NAUSEA MEDICATION  *UNUSUAL SHORTNESS OF BREATH  *UNUSUAL BRUISING OR BLEEDING  TENDERNESS IN MOUTH AND THROAT WITH OR WITHOUT PRESENCE OF ULCERS  *URINARY PROBLEMS  *BOWEL PROBLEMS  UNUSUAL RASH Items with * indicate a potential emergency and should be followed up as soon as possible.  Feel free to call the clinic you have any questions or concerns. The clinic phone number is [phone].   Cetuximab injection What is this medicine? CETUXIMAB (se TUX i mab) is a chemotherapy drug. It targets a specific protein within cancer cells and stops the cells from growing. It is used to treat colorectal cancer and head and neck cancer. This medicine may be used for other purposes; ask your health care Garlin Batdorf or pharmacist if you have questions. COMMON BRAND NAME(S): Erbitux What should I tell my health care Deyjah Kindel before I take this medicine? They need to know if you have any of these conditions: -heart disease -history of irregular heartbeat -history of low levels of calcium, magnesium, or potassium in the blood -lung or breathing disease, like asthma -an unusual or allergic reaction to cetuximab, other medicines, foods, dyes, or preservatives -pregnant or trying to  get pregnant -breast-feeding How should I use this medicine? This drug is given as an infusion into a vein. It is administered in a hospital or clinic by a specially trained health care professional. Talk to your pediatrician regarding the use of this medicine in children. Special care may be needed. Overdosage: If you think you have taken too much of this medicine contact a poison control center or emergency room at once. NOTE: This medicine is only for you. Do not share this medicine with others. What if I miss a dose? It is important not to miss your dose. Call your doctor or health care professional if you are unable to keep an appointment. What may interact with this medicine? Interactions are not expected. This list may not describe all possible interactions. Give your health care Adalea Handler a list of all the medicines, herbs, non-prescription drugs, or dietary supplements you use. Also tell them if you smoke, drink alcohol, or use illegal drugs. Some items may interact with your medicine. What should I watch for while using this medicine? Visit your doctor or health care professional for regular checks on your progress. This drug may make you feel generally unwell. This is not uncommon, as chemotherapy can affect healthy cells as well as cancer cells. Report any side effects. Continue your course of treatment even though you feel ill unless your doctor tells you to stop. This medicine can make you more sensitive to the sun. Keep out of the sun while taking this medicine and for 2 months after the last dose. If you cannot avoid being in the sun, wear protective clothing and use  sunscreen. Do not use sun lamps or tanning beds/booths. You may need blood work done while you are taking this medicine. In some cases, you may be given additional medicines to help with side effects. Follow all directions for their use. Call your doctor or health care professional for advice if you get a fever, chills or  sore throat, or other symptoms of a cold or flu. Do not treat yourself. This drug decreases your body's ability to fight infections. Try to avoid being around people who are sick. Avoid taking products that contain aspirin, acetaminophen, ibuprofen, naproxen, or ketoprofen unless instructed by your doctor. These medicines may hide a fever. Do not become pregnant while taking this medicine. Women should inform their doctor if they wish to become pregnant or think they might be pregnant. There is a potential for serious side effects to an unborn child. Use adequate birth control methods. Avoid pregnancy for at least 6 months after your last dose. Talk to your health care professional or pharmacist for more information. Do not breast-feed an infant while taking this medicine or during the 2 months after your last dose. What side effects may I notice from receiving this medicine? Side effects that you should report to your doctor or health care professional as soon as possible: -allergic reactions like skin rash, itching or hives, swelling of the face, lips, or tongue -breathing problems -changes in vision -fast, irregular heartbeat -feeling faint or lightheaded, falls -fever, chills -mouth sores -trouble passing urine or change in the amount of urine -unusually weak or tired Side effects that usually do not require medical attention (report to your doctor or health care professional if they continue or are bothersome): -changes in skin like acne, cracks, skin dryness -constipation -diarrhea -headache -nail changes -nausea, vomiting -stomach upset -weight loss This list may not describe all possible side effects. Call your doctor for medical advice about side effects. You may report side effects to FDA at 1-800-FDA-1088. Where should I keep my medicine? This drug is given in a hospital or clinic and will not be stored at home. NOTE: This sheet is a summary. It may not cover all possible  information. If you have questions about this medicine, talk to your doctor, pharmacist, or health care Tarique Loveall.  2015, [employer] Standard. (2009-11-24 14:01:41) Carboplatin injection What is this medicine? CARBOPLATIN (KAR boe pla tin) is a chemotherapy drug. It targets fast dividing cells, like cancer cells, and causes these cells to die. This medicine is used to treat ovarian cancer and many other cancers. This medicine may be used for other purposes; ask your health care Breyana Follansbee or pharmacist if you have questions. COMMON BRAND NAME(S): Paraplatin What should I tell my health care Roselind Klus before I take this medicine? They need to know if you have any of these conditions: -blood disorders -hearing problems -kidney disease -recent or ongoing radiation therapy -an unusual or allergic reaction to carboplatin, cisplatin, other chemotherapy, other medicines, foods, dyes, or preservatives -pregnant or trying to get pregnant -breast-feeding How should I use this medicine? This drug is usually given as an infusion into a vein. It is administered in a hospital or clinic by a specially trained health care professional. Talk to your pediatrician regarding the use of this medicine in children. Special care may be needed. Overdosage: If you think you have taken too much of this medicine contact a poison control center or emergency room at once. NOTE: This medicine is only for you. Do not share this medicine with  others. What if I miss a dose? It is important not to miss a dose. Call your doctor or health care professional if you are unable to keep an appointment. What may interact with this medicine? -medicines for seizures -medicines to increase blood counts like filgrastim, pegfilgrastim, sargramostim -some antibiotics like amikacin, gentamicin, neomycin, streptomycin, tobramycin -vaccines Talk to your doctor or health care professional before taking any of these  medicines: -acetaminophen -aspirin -ibuprofen -ketoprofen -naproxen This list may not describe all possible interactions. Give your health care Maudell Stanbrough a list of all the medicines, herbs, non-prescription drugs, or dietary supplements you use. Also tell them if you smoke, drink alcohol, or use illegal drugs. Some items may interact with your medicine. What should I watch for while using this medicine? Your condition will be monitored carefully while you are receiving this medicine. You will need important blood work done while you are taking this medicine. This drug may make you feel generally unwell. This is not uncommon, as chemotherapy can affect healthy cells as well as cancer cells. Report any side effects. Continue your course of treatment even though you feel ill unless your doctor tells you to stop. In some cases, you may be given additional medicines to help with side effects. Follow all directions for their use. Call your doctor or health care professional for advice if you get a fever, chills or sore throat, or other symptoms of a cold or flu. Do not treat yourself. This drug decreases your body's ability to fight infections. Try to avoid being around people who are sick. This medicine may increase your risk to bruise or bleed. Call your doctor or health care professional if you notice any unusual bleeding. Be careful brushing and flossing your teeth or using a toothpick because you may get an infection or bleed more easily. If you have any dental work done, tell your dentist you are receiving this medicine. Avoid taking products that contain aspirin, acetaminophen, ibuprofen, naproxen, or ketoprofen unless instructed by your doctor. These medicines may hide a fever. Do not become pregnant while taking this medicine. Women should inform their doctor if they wish to become pregnant or think they might be pregnant. There is a potential for serious side effects to an unborn child. Talk to  your health care professional or pharmacist for more information. Do not breast-feed an infant while taking this medicine. What side effects may I notice from receiving this medicine? Side effects that you should report to your doctor or health care professional as soon as possible: -allergic reactions like skin rash, itching or hives, swelling of the face, lips, or tongue -signs of infection - fever or chills, cough, sore throat, pain or difficulty passing urine -signs of decreased platelets or bleeding - bruising, pinpoint red spots on the skin, black, tarry stools, nosebleeds -signs of decreased red blood cells - unusually weak or tired, fainting spells, lightheadedness -breathing problems -changes in hearing -changes in vision -chest pain -high blood pressure -low blood counts - This drug may decrease the number of white blood cells, red blood cells and platelets. You may be at increased risk for infections and bleeding. -nausea and vomiting -pain, swelling, redness or irritation at the injection site -pain, tingling, numbness in the hands or feet -problems with balance, talking, walking -trouble passing urine or change in the amount of urine Side effects that usually do not require medical attention (report to your doctor or health care professional if they continue or are bothersome): -hair loss -loss  of appetite -metallic taste in the mouth or changes in taste This list may not describe all possible side effects. Call your doctor for medical advice about side effects. You may report side effects to FDA at 1-800-FDA-1088. Where should I keep my medicine? This drug is given in a hospital or clinic and will not be stored at home. NOTE: This sheet is a summary. It may not cover all possible information. If you have questions about this medicine, talk to your doctor, pharmacist, or health care Flynn Lininger.  2015, [employer] Standard. (2007-04-10 14:38:05) Fluorouracil, 5-FU  injection What is this medicine? FLUOROURACIL, 5-FU (flure oh YOOR a sil) is a chemotherapy drug. It slows the growth of cancer cells. This medicine is used to treat many types of cancer like breast cancer, colon or rectal cancer, pancreatic cancer, and stomach cancer. This medicine may be used for other purposes; ask your health care Vanshika Jastrzebski or pharmacist if you have questions. COMMON BRAND NAME(S): Adrucil What should I tell my health care Sair Faulcon before I take this medicine? They need to know if you have any of these conditions: -blood disorders -dihydropyrimidine dehydrogenase (DPD) deficiency -infection (especially a virus infection such as chickenpox, cold sores, or herpes) -kidney disease -liver disease -malnourished, poor nutrition -recent or ongoing radiation therapy -an unusual or allergic reaction to fluorouracil, other chemotherapy, other medicines, foods, dyes, or preservatives -pregnant or trying to get pregnant -breast-feeding How should I use this medicine? This drug is given as an infusion or injection into a vein. It is administered in a hospital or clinic by a specially trained health care professional. Talk to your pediatrician regarding the use of this medicine in children. Special care may be needed. Overdosage: If you think you have taken too much of this medicine contact a poison control center or emergency room at once. NOTE: This medicine is only for you. Do not share this medicine with others. What if I miss a dose? It is important not to miss your dose. Call your doctor or health care professional if you are unable to keep an appointment. What may interact with this medicine? -allopurinol -cimetidine -dapsone -digoxin -hydroxyurea -leucovorin -levamisole -medicines for seizures like ethotoin, fosphenytoin, phenytoin -medicines to increase blood counts like filgrastim, pegfilgrastim, sargramostim -medicines that treat or prevent blood clots like  warfarin, enoxaparin, and dalteparin -methotrexate -metronidazole -pyrimethamine -some other chemotherapy drugs like busulfan, cisplatin, estramustine, vinblastine -trimethoprim -trimetrexate -vaccines Talk to your doctor or health care professional before taking any of these medicines: -acetaminophen -aspirin -ibuprofen -ketoprofen -naproxen This list may not describe all possible interactions. Give your health care Brylie Sneath a list of all the medicines, herbs, non-prescription drugs, or dietary supplements you use. Also tell them if you smoke, drink alcohol, or use illegal drugs. Some items may interact with your medicine. What should I watch for while using this medicine? Visit your doctor for checks on your progress. This drug may make you feel generally unwell. This is not uncommon, as chemotherapy can affect healthy cells as well as cancer cells. Report any side effects. Continue your course of treatment even though you feel ill unless your doctor tells you to stop. In some cases, you may be given additional medicines to help with side effects. Follow all directions for their use. Call your doctor or health care professional for advice if you get a fever, chills or sore throat, or other symptoms of a cold or flu. Do not treat yourself. This drug decreases your body's ability to fight infections. Try  to avoid being around people who are sick. This medicine may increase your risk to bruise or bleed. Call your doctor or health care professional if you notice any unusual bleeding. Be careful brushing and flossing your teeth or using a toothpick because you may get an infection or bleed more easily. If you have any dental work done, tell your dentist you are receiving this medicine. Avoid taking products that contain aspirin, acetaminophen, ibuprofen, naproxen, or ketoprofen unless instructed by your doctor. These medicines may hide a fever. Do not become pregnant while taking this medicine.  Women should inform their doctor if they wish to become pregnant or think they might be pregnant. There is a potential for serious side effects to an unborn child. Talk to your health care professional or pharmacist for more information. Do not breast-feed an infant while taking this medicine. Men should inform their doctor if they wish to father a child. This medicine may lower sperm counts. Do not treat diarrhea with over the counter products. Contact your doctor if you have diarrhea that lasts more than 2 days or if it is severe and watery. This medicine can make you more sensitive to the sun. Keep out of the sun. If you cannot avoid being in the sun, wear protective clothing and use sunscreen. Do not use sun lamps or tanning beds/booths. What side effects may I notice from receiving this medicine? Side effects that you should report to your doctor or health care professional as soon as possible: -allergic reactions like skin rash, itching or hives, swelling of the face, lips, or tongue -low blood counts - this medicine may decrease the number of white blood cells, red blood cells and platelets. You may be at increased risk for infections and bleeding. -signs of infection - fever or chills, cough, sore throat, pain or difficulty passing urine -signs of decreased platelets or bleeding - bruising, pinpoint red spots on the skin, black, tarry stools, blood in the urine -signs of decreased red blood cells - unusually weak or tired, fainting spells, lightheadedness -breathing problems -changes in vision -chest pain -mouth sores -nausea and vomiting -pain, swelling, redness at site where injected -pain, tingling, numbness in the hands or feet -redness, swelling, or sores on hands or feet -stomach pain -unusual bleeding Side effects that usually do not require medical attention (report to your doctor or health care professional if they continue or are bothersome): -changes in finger or toe  nails -diarrhea -dry or itchy skin -hair loss -headache -loss of appetite -sensitivity of eyes to the light -stomach upset -unusually teary eyes This list may not describe all possible side effects. Call your doctor for medical advice about side effects. You may report side effects to FDA at 1-800-FDA-1088. Where should I keep my medicine? This drug is given in a hospital or clinic and will not be stored at home. NOTE: This sheet is a summary. It may not cover all possible information. If you have questions about this medicine, talk to your doctor, pharmacist, or health care Kaida Games.  2015, [employer] Standard. (2007-05-09 13:53:16)

## 2013-07-11 NOTE — Progress Notes (Signed)
Per Dr. Alvy Bimler, [place] to treat with 07/01/13 labs

## 2013-07-12 ENCOUNTER — Telehealth: Payer: Self-pay | Admitting: *Deleted

## 2013-07-12 NOTE — Telephone Encounter (Signed)
Called Al[facility] Deem for chemotherapy F/U.  Patient is doing well and reported to work today.  Denies n/v.  Denies any new side effects or symptoms.  Bowel and bladder is functioning well.  Eating and drinking well and I instructed to drink 64 oz minimum daily or at least the day before, of and after treatment.  Denies questions at this time and encouraged to call if needed.  Reviewed how to call after hours in the case of an emergency.

## 2013-07-12 NOTE — Telephone Encounter (Signed)
Message copied by Cherylynn Ridges on Fri Jul 12, 2013 11:18 AM ------      Message from: Alla Feeling      Created: Thu Jul 11, 2013  2:47 PM      Regarding: chemo follow up call      Contact: [phone]       Work number to try: [id number] erbitux, carbo, 5FU 4 day pump. No issues. Dr. Alvy Bimler ------

## 2013-07-15 ENCOUNTER — Other Ambulatory Visit: Payer: Self-pay | Admitting: *Deleted

## 2013-07-15 ENCOUNTER — Ambulatory Visit (HOSPITAL_BASED_OUTPATIENT_CLINIC_OR_DEPARTMENT_OTHER): Payer: BC Managed Care – PPO

## 2013-07-15 VITALS — BP 115/94 | HR 104 | Temp 96.8°F

## 2013-07-15 DIAGNOSIS — C109 Malignant neoplasm of oropharynx, unspecified: Secondary | ICD-10-CM

## 2013-07-15 DIAGNOSIS — Z452 Encounter for adjustment and management of vascular access device: Secondary | ICD-10-CM

## 2013-07-15 MED ORDER — SODIUM CHLORIDE 0.9 % IJ SOLN
10.0000 mL | INTRAMUSCULAR | Status: DC | PRN
  Administered 2013-07-15: 10 mL
  Filled 2013-07-15: qty 10

## 2013-07-15 MED ORDER — HEPARIN SOD (PORK) LOCK FLUSH 100 UNIT/ML IV SOLN
500.0000 [IU] | Freq: Once | INTRAVENOUS | Status: AC | PRN
  Administered 2013-07-15: 500 [IU]
  Filled 2013-07-15: qty 5

## 2013-07-15 MED ORDER — HEPARIN SOD (PORK) LOCK FLUSH 100 UNIT/ML IV SOLN
250.0000 [IU] | Freq: Once | INTRAVENOUS | Status: DC | PRN
  Filled 2013-07-15: qty 5

## 2013-07-15 NOTE — Patient Instructions (Signed)

## 2013-07-15 NOTE — Progress Notes (Signed)
Patient's pump still had 16.44ml left in chemo bag.  Infusion RN called.  Diane RN spoke with Dr Alvy Bimler who instructed to stop chemo.  Diane called flush nurse to stop chemo and flush port.

## 2013-07-15 NOTE — Progress Notes (Signed)
Pt presents for pump d/c. Has 16 ml 41fu left. Per Dr Jacklynn Lewis -discontinue pump now -no bolus.

## 2013-07-17 ENCOUNTER — Telehealth: Payer: Self-pay | Admitting: Hematology and Oncology

## 2013-07-17 ENCOUNTER — Encounter: Payer: Self-pay | Admitting: *Deleted

## 2013-07-17 ENCOUNTER — Ambulatory Visit (HOSPITAL_BASED_OUTPATIENT_CLINIC_OR_DEPARTMENT_OTHER): Payer: BC Managed Care – PPO | Admitting: Hematology and Oncology

## 2013-07-17 ENCOUNTER — Encounter: Payer: Self-pay | Admitting: Hematology and Oncology

## 2013-07-17 ENCOUNTER — Telehealth: Payer: Self-pay | Admitting: *Deleted

## 2013-07-17 VITALS — BP 146/78 | HR 112 | Temp 96.4°F | Resp 20 | Ht 73.0 in | Wt 112.2 lb

## 2013-07-17 DIAGNOSIS — R634 Abnormal weight loss: Secondary | ICD-10-CM

## 2013-07-17 DIAGNOSIS — R070 Pain in throat: Secondary | ICD-10-CM

## 2013-07-17 DIAGNOSIS — K1370 Unspecified lesions of oral mucosa: Secondary | ICD-10-CM

## 2013-07-17 DIAGNOSIS — F17200 Nicotine dependence, unspecified, uncomplicated: Secondary | ICD-10-CM

## 2013-07-17 DIAGNOSIS — Z720 Tobacco use: Secondary | ICD-10-CM

## 2013-07-17 DIAGNOSIS — C109 Malignant neoplasm of oropharynx, unspecified: Secondary | ICD-10-CM

## 2013-07-17 MED ORDER — MORPHINE SULFATE 15 MG PO TABS: 15.0000 mg | ORAL_TABLET | ORAL | Status: DC | PRN

## 2013-07-17 NOTE — Assessment & Plan Note (Signed)
He lost 5 more pounds of weight. I recommend he increase oral supplement intake.

## 2013-07-17 NOTE — Assessment & Plan Note (Signed)
I refilled his prescription morphine sulfate. We discussed about narcotic refill policy.

## 2013-07-17 NOTE — Assessment & Plan Note (Signed)
Overall, he tolerated treatment well. Continue treatment without dosage adjustment. I will see him again prior to cycle 2.

## 2013-07-17 NOTE — Telephone Encounter (Signed)
Gave pt appt for lab, md emailed Sharyn Lull regarding chemo

## 2013-07-17 NOTE — Telephone Encounter (Signed)
Per staff message and POF I have scheduled appts. Advised scheduler of appts. JMW  

## 2013-07-17 NOTE — Progress Notes (Signed)
[facility] OFFICE PROGRESS NOTE  Patient Care Team: No Pcp Per Patient as PCP - General (General Practice) Heath Lark, MD as Consulting Physician (Hematology and Oncology) Ascencion Dike, MD as Consulting Physician (Otolaryngology) Brooks Sailors, RN as Oncology Nurse Navigator (Oncology)  SUMMARY OF ONCOLOGIC HISTORY: Oncology History   Oropharyngeal cancer, HPV negative   Primary site: Pharynx - Oropharynx   Staging method: AJCC 7th Edition   Clinical: Stage IVC (T4b, N3, M1) signed by Heath Lark, MD on 07/01/2013  8:32 PM   Summary: Stage IVC (T4b, N3, M1)         Oropharyngeal cancer   05/06/2013 Imaging 4.2 x 4.7 x 6.1 cm heterogeneous right neck mass in the right level 2 & level 3 stations. It invades the carotid space, obstructing the internal jugular vein. The primary lesion may be along the inferior aspect of the right palatine tonsil    05/22/2013 Procedure Laryngoscopy revealed abnormalities beneath the right tonsil area. Unfortunately tonsillectomy and right neck biopsy came back benign tissue.   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   06/24/2013 Surgery He underwent multiple extraction of tooth numbers 2, 3, 6, 8, 9, 11, 12, 13, 14, 15, 19, 20, 22, 23, 24, 25, 26, 27, and 28 along with 4 Quadrants of alveoloplasty   06/28/2013 Imaging PET/CT scan results show widespread pulmonary metastasis.   07/11/2013 -  Chemotherapy The patient is started on palliative chemotherapy with 5-FU, carboplatin and weekly Erbitux.    INTERVAL HISTORY: Please see below for problem oriented charting. He tolerated treatment well. He continued to work full-time. He takes morphine sulfate 5-6 times a day to control his mouth pain and for pain. Denies side effects of chemotherapy so far.  REVIEW OF SYSTEMS:   Constitutional: Denies fevers, chills  Eyes: Denies blurriness of vision Respiratory: Denies cough, dyspnea or  wheezes Cardiovascular: Denies palpitation, chest discomfort or lower extremity swelling Gastrointestinal:  Denies nausea, heartburn or change in bowel habits Skin: Denies abnormal skin rashes Lymphatics: Denies new lymphadenopathy or easy bruising Neurological:Denies numbness, tingling or new weaknesses Behavioral/Psych: Mood is stable, no new changes  All other systems were reviewed with the patient and are negative.  I have reviewed the past medical history, past surgical history, social history and family history with the patient and they are unchanged from previous note.  ALLERGIES:  has No Known Allergies.  MEDICATIONS:  Current Outpatient Prescriptions  Medication Sig Dispense Refill  . acetaminophen (TYLENOL) 500 MG tablet Take 500 mg by mouth every 6 (six) hours as needed for mild pain.       Marland Kitchen docusate sodium (COLACE) 100 MG capsule Take 1 capsule (100 mg total) by mouth 2 (two) times daily.  60 capsule  0  . lidocaine-prilocaine (EMLA) cream Apply 1 application topically as needed.  30 g  0  . morphine (MSIR) 15 MG tablet Take 1 tablet (15 mg total) by mouth every 4 (four) hours as needed for severe pain.  90 tablet  0  . ondansetron (ZOFRAN) 8 MG tablet Take 1 tablet (8 mg total) by mouth every 8 (eight) hours as needed for nausea or vomiting.  30 tablet  1  . prochlorperazine (COMPAZINE) 10 MG tablet Take 1 tablet (10 mg total) by mouth every 6 (six) hours as needed (Nausea or vomiting).  30 tablet  1   No current facility-administered medications for this visit.    PHYSICAL EXAMINATION: ECOG PERFORMANCE STATUS: 1 -  Symptomatic but completely ambulatory  Filed Vitals:   07/17/13 0926  BP: 146/78  Pulse: 112  Temp: 96.4 F (35.8 C)  Resp: 20   Filed Weights   07/17/13 0926  Weight: 112 lb 3.2 oz (50.894 kg)    GENERAL:alert, no distress and comfortable. He looks thin and mildly cachectic SKIN: skin color, texture, turgor are normal, no rashes or significant  lesions EYES: normal, Conjunctiva are pink and non-injected, sclera clear OROPHARYNX:no exudate, no erythema and lips, buccal mucosa, and tongue normal . No thrush NECK: Lymphadenopathy on the right side of the neck is reducing in size.  LUNGS: clear to auscultation and percussion with normal breathing effort HEART: regular rate & rhythm and no murmurs and no lower extremity edema ABDOMEN:abdomen soft, non-tender and normal bowel sounds Musculoskeletal:no cyanosis of digits and no clubbing  NEURO: alert & oriented x 3 with fluent speech, no focal motor/sensory deficits  LABORATORY DATA:  I have reviewed the data as listed    Component Value Date/Time   NA 136 07/01/2013 1210   K 4.8 07/01/2013 1210   CO2 29 07/01/2013 1210   GLUCOSE 154* 07/01/2013 1210   BUN 13.1 07/01/2013 1210   CREATININE 0.8 07/01/2013 1210   CALCIUM 9.7 07/01/2013 1210   PROT 8.0 07/01/2013 1210   ALBUMIN 3.3* 07/01/2013 1210   AST 32 07/01/2013 1210   ALT 41 07/01/2013 1210   ALKPHOS 66 07/01/2013 1210   BILITOT 0.34 07/01/2013 1210    No results found for this basename: SPEP, UPEP,  kappa and lambda light chains    Lab Results  Component Value Date   WBC 10.7* 07/01/2013   NEUTROABS 8.4* 07/01/2013   HGB 14.7 07/01/2013   HCT 43.9 07/01/2013   MCV 98.6* 07/01/2013   PLT 397 07/01/2013      Chemistry      Component Value Date/Time   NA 136 07/01/2013 1210   K 4.8 07/01/2013 1210   CO2 29 07/01/2013 1210   BUN 13.1 07/01/2013 1210   CREATININE 0.8 07/01/2013 1210      Component Value Date/Time   CALCIUM 9.7 07/01/2013 1210   ALKPHOS 66 07/01/2013 1210   AST 32 07/01/2013 1210   ALT 41 07/01/2013 1210   BILITOT 0.34 07/01/2013 1210      ASSESSMENT & PLAN:  Oropharyngeal cancer Overall, he tolerated treatment well. Continue treatment without dosage adjustment. I will see him again prior to cycle 2.  Weight loss He lost 5 more pounds of weight. I recommend he increase oral supplement intake.  Tobacco  abuse I spent some time counseling the patient the importance of tobacco cessation. he is currently attempting to quit on his own   Throat pain I refilled his prescription morphine sulfate. We discussed about narcotic refill policy.   Orders Placed This Encounter  Procedures  . Magnesium    Standing Status: Standing     Number of Occurrences: 33     Standing Expiration Date: 07/18/2014   All questions were answered. The patient knows to call the clinic with any problems, questions or concerns. No barriers to learning was detected. I spent 25 minutes counseling the patient face to face. The total time spent in the appointment was 30 minutes  and more than 50% was on counseling and review of test results     [facility], [place], MD 07/17/2013 12:41 PM

## 2013-07-17 NOTE — Assessment & Plan Note (Signed)
I spent some time counseling the patient the importance of tobacco cessation. he is currently attempting to quit on his own 

## 2013-07-18 ENCOUNTER — Ambulatory Visit: Payer: BC Managed Care – PPO | Admitting: Nutrition

## 2013-07-18 ENCOUNTER — Encounter: Payer: Self-pay | Admitting: *Deleted

## 2013-07-18 ENCOUNTER — Ambulatory Visit (HOSPITAL_BASED_OUTPATIENT_CLINIC_OR_DEPARTMENT_OTHER): Payer: BC Managed Care – PPO

## 2013-07-18 VITALS — BP 127/82 | HR 99 | Temp 96.7°F | Resp 17

## 2013-07-18 DIAGNOSIS — C109 Malignant neoplasm of oropharynx, unspecified: Secondary | ICD-10-CM

## 2013-07-18 DIAGNOSIS — Z5112 Encounter for antineoplastic immunotherapy: Secondary | ICD-10-CM

## 2013-07-18 MED ORDER — SODIUM CHLORIDE 0.9 % IJ SOLN
10.0000 mL | INTRAMUSCULAR | Status: DC | PRN
  Administered 2013-07-18: 10 mL
  Filled 2013-07-18: qty 10

## 2013-07-18 MED ORDER — DIPHENHYDRAMINE HCL 50 MG/ML IJ SOLN
50.0000 mg | Freq: Once | INTRAMUSCULAR | Status: AC
  Administered 2013-07-18: 50 mg via INTRAVENOUS

## 2013-07-18 MED ORDER — HEPARIN SOD (PORK) LOCK FLUSH 100 UNIT/ML IV SOLN
500.0000 [IU] | Freq: Once | INTRAVENOUS | Status: AC | PRN
  Administered 2013-07-18: 500 [IU]
  Filled 2013-07-18: qty 5

## 2013-07-18 MED ORDER — DIPHENHYDRAMINE HCL 50 MG/ML IJ SOLN
INTRAMUSCULAR | Status: AC
  Filled 2013-07-18: qty 1

## 2013-07-18 MED ORDER — CETUXIMAB CHEMO IV INJECTION 200 MG/100ML
250.0000 mg/m2 | Freq: Once | INTRAVENOUS | Status: AC
  Administered 2013-07-18: 400 mg via INTRAVENOUS
  Filled 2013-07-18: qty 200

## 2013-07-18 MED ORDER — SODIUM CHLORIDE 0.9 % IV SOLN
Freq: Once | INTRAVENOUS | Status: AC
  Administered 2013-07-18: 09:00:00 via INTRAVENOUS

## 2013-07-18 NOTE — Progress Notes (Signed)
To provide support and encouragement, met with patient while he was receiving chemo infusion.  He denied needs at this time.   Gayleen Orem, RN, BSN, [facility] On[facility] Navigator [phone]

## 2013-07-18 NOTE — Progress Notes (Signed)
To provide support, encouragement and care continuity, met with patient during under treat appt with Dr. Alvy Bimler. Patient reported he is managing his pain control with current medication, reported that he is getting more sleep now.  I encourage him to maintain/increase hydration during chemo tmt, he verbalized understanding.  He reported he continues to work and hopes to continue as long as possible.  Initiating navigation as L2 patient (treatments established) with this encounter.  Gayleen Orem, RN, BSN, [facility] Ne[facility] Oncology Navigator [phone]

## 2013-07-18 NOTE — Progress Notes (Signed)
Nutrition followup completed with patient in chemotherapy.  Patient is being treated for tonsil cancer.  Patient has had weight loss with weight being documented as 112.2 pounds July 1, which is decreased from 120 pounds June 18.  Patient has had a 17% weight loss over 6 weeks.  He is drinking 4-5 boost plus daily.  He tries to eat soft foods.  Patient meets criteria for severe malnutrition in the context of acute illness secondary to 17% weight loss in 6 weeks with depletion of both body fat and muscle mass on physical exam.  BMI of 14.81 is underweight.  Nutrition diagnosis: Food and nutrition related knowledge deficit and inadequate oral intake of continue.  Intervention: Patient educated to increase calories and protein in soft foods throughout the day.  Patient recommended to increase boost +6 times a day to provide 2100 calories.  Provided patient with additional coupons.  Teach back method used.  Monitoring, evaluation, goals: Patient will tolerate increased calories and protein to promote weight gain.  If patient unable to improve nutrition status, recommended patient have feeding tube placed. Patient is agreeable.  Next visit: Thursday, July 9, during chemotherapy.  **Disclaimer: This note was dictated with voice recognition software. Similar sounding words can inadvertently be transcribed and this note may contain transcription errors which may not have been corrected upon publication of note.**

## 2013-07-18 NOTE — Patient Instructions (Signed)
Cetuximab injection What is this medicine? CETUXIMAB (se TUX i mab) is a chemotherapy drug. It targets a specific protein within cancer cells and stops the cells from growing. It is used to treat colorectal cancer and head and neck cancer. This medicine may be used for other purposes; ask your health care provider or pharmacist if you have questions. COMMON BRAND NAME(S): Erbitux What should I tell my health care provider before I take this medicine? They need to know if you have any of these conditions: -heart disease -history of irregular heartbeat -history of low levels of calcium, magnesium, or potassium in the blood -lung or breathing disease, like asthma -an unusual or allergic reaction to cetuximab, other medicines, foods, dyes, or preservatives -pregnant or trying to get pregnant -breast-feeding How should I use this medicine? This drug is given as an infusion into a vein. It is administered in a hospital or clinic by a specially trained health care professional. Talk to your pediatrician regarding the use of this medicine in children. Special care may be needed. Overdosage: If you think you have taken too much of this medicine contact a poison control center or emergency room at once. NOTE: This medicine is only for you. Do not share this medicine with others. What if I miss a dose? It is important not to miss your dose. Call your doctor or health care professional if you are unable to keep an appointment. What may interact with this medicine? Interactions are not expected. This list may not describe all possible interactions. Give your health care provider a list of all the medicines, herbs, non-prescription drugs, or dietary supplements you use. Also tell them if you smoke, drink alcohol, or use illegal drugs. Some items may interact with your medicine. What should I watch for while using this medicine? Visit your doctor or health care professional for regular checks on your  progress. This drug may make you feel generally unwell. This is not uncommon, as chemotherapy can affect healthy cells as well as cancer cells. Report any side effects. Continue your course of treatment even though you feel ill unless your doctor tells you to stop. This medicine can make you more sensitive to the sun. Keep out of the sun while taking this medicine and for 2 months after the last dose. If you cannot avoid being in the sun, wear protective clothing and use sunscreen. Do not use sun lamps or tanning beds/booths. You may need blood work done while you are taking this medicine. In some cases, you may be given additional medicines to help with side effects. Follow all directions for their use. Call your doctor or health care professional for advice if you get a fever, chills or sore throat, or other symptoms of a cold or flu. Do not treat yourself. This drug decreases your body's ability to fight infections. Try to avoid being around people who are sick. Avoid taking products that contain aspirin, acetaminophen, ibuprofen, naproxen, or ketoprofen unless instructed by your doctor. These medicines may hide a fever. Do not become pregnant while taking this medicine. Women should inform their doctor if they wish to become pregnant or think they might be pregnant. There is a potential for serious side effects to an unborn child. Use adequate birth control methods. Avoid pregnancy for at least 6 months after your last dose. Talk to your health care professional or pharmacist for more information. Do not breast-feed an infant while taking this medicine or during the 2 months after your  last dose. What side effects may I notice from receiving this medicine? Side effects that you should report to your doctor or health care professional as soon as possible: -allergic reactions like skin rash, itching or hives, swelling of the face, lips, or tongue -breathing problems -changes in vision -fast, irregular  heartbeat -feeling faint or lightheaded, falls -fever, chills -mouth sores -trouble passing urine or change in the amount of urine -unusually weak or tired Side effects that usually do not require medical attention (report to your doctor or health care professional if they continue or are bothersome): -changes in skin like acne, cracks, skin dryness -constipation -diarrhea -headache -nail changes -nausea, vomiting -stomach upset -weight loss This list may not describe all possible side effects. Call your doctor for medical advice about side effects. You may report side effects to FDA at 1-800-FDA-1088. Where should I keep my medicine? This drug is given in a hospital or clinic and will not be stored at home. NOTE: This sheet is a summary. It may not cover all possible information. If you have questions about this medicine, talk to your doctor, pharmacist, or health care provider.  2015, [employer]vier/Gold Standard. (2009-11-24 14:01:41)

## 2013-07-18 NOTE — Progress Notes (Signed)
Per Dr. Alvy Bimler, okay to proceed with treatment without labs.

## 2013-07-18 NOTE — Progress Notes (Signed)
To provide support, encouragement and care continuity, met with patient during appt with Dr. Alvy Bimler.  He indicated he is managing pain with current med, sleeping better.  I encouraged him to increase his hydration as able, he verbalized understanding.  In response to my inquiry, he stated he has back-up transportation when he needs it; he is continuing to work, has recently met with nutritionist.    Gayleen Orem, RN, BSN, [place] [facility] Navigator [phone]

## 2013-07-22 ENCOUNTER — Telehealth: Payer: Self-pay | Admitting: Hematology and Oncology

## 2013-07-22 NOTE — Telephone Encounter (Signed)
Talked to pt and gave him appt date for 7/8/ advised pt to get appt calendar for july 2015

## 2013-07-24 ENCOUNTER — Other Ambulatory Visit (HOSPITAL_BASED_OUTPATIENT_CLINIC_OR_DEPARTMENT_OTHER): Payer: BC Managed Care – PPO

## 2013-07-24 DIAGNOSIS — C109 Malignant neoplasm of oropharynx, unspecified: Secondary | ICD-10-CM

## 2013-07-24 DIAGNOSIS — R070 Pain in throat: Secondary | ICD-10-CM

## 2013-07-24 LAB — COMPREHENSIVE METABOLIC PANEL (CC13)
ALK PHOS: 69 U/L (ref 40–150)
ALT: 44 U/L (ref 0–55)
ANION GAP: 10 meq/L (ref 3–11)
AST: 32 U/L (ref 5–34)
Albumin: 3.3 g/dL — ABNORMAL LOW (ref 3.5–5.0)
BUN: 11.6 mg/dL (ref 7.0–26.0)
CO2: 30 meq/L — AB (ref 22–29)
CREATININE: 0.7 mg/dL (ref 0.7–1.3)
Calcium: 9.8 mg/dL (ref 8.4–10.4)
Chloride: 99 mEq/L (ref 98–109)
GLUCOSE: 157 mg/dL — AB (ref 70–140)
Potassium: 3.9 mEq/L (ref 3.5–5.1)
SODIUM: 138 meq/L (ref 136–145)
Total Bilirubin: 0.41 mg/dL (ref 0.20–1.20)
Total Protein: 7.8 g/dL (ref 6.4–8.3)

## 2013-07-24 LAB — CBC WITH DIFFERENTIAL/PLATELET
BASO%: 0.6 % (ref 0.0–2.0)
Basophils Absolute: 0 10*3/uL (ref 0.0–0.1)
EOS ABS: 0 10*3/uL (ref 0.0–0.5)
EOS%: 0.3 % (ref 0.0–7.0)
HEMATOCRIT: 41.1 % (ref 38.4–49.9)
HGB: 13.5 g/dL (ref 13.0–17.1)
LYMPH%: 10.8 % — AB (ref 14.0–49.0)
MCH: 31.9 pg (ref 27.2–33.4)
MCHC: 32.7 g/dL (ref 32.0–36.0)
MCV: 97.6 fL (ref 79.3–98.0)
MONO#: 0.6 10*3/uL (ref 0.1–0.9)
MONO%: 7.6 % (ref 0.0–14.0)
NEUT%: 80.7 % — AB (ref 39.0–75.0)
NEUTROS ABS: 6 10*3/uL (ref 1.5–6.5)
PLATELETS: 176 10*3/uL (ref 140–400)
RBC: 4.21 10*6/uL (ref 4.20–5.82)
RDW: 13 % (ref 11.0–14.6)
WBC: 7.5 10*3/uL (ref 4.0–10.3)
lymph#: 0.8 10*3/uL — ABNORMAL LOW (ref 0.9–3.3)

## 2013-07-24 LAB — MAGNESIUM (CC13): MAGNESIUM: 2.3 mg/dL (ref 1.5–2.5)

## 2013-07-25 ENCOUNTER — Ambulatory Visit (HOSPITAL_BASED_OUTPATIENT_CLINIC_OR_DEPARTMENT_OTHER): Payer: BC Managed Care – PPO

## 2013-07-25 ENCOUNTER — Ambulatory Visit: Payer: BC Managed Care – PPO | Admitting: Nutrition

## 2013-07-25 VITALS — BP 111/76 | HR 109 | Temp 97.8°F | Resp 18 | Wt 110.8 lb

## 2013-07-25 DIAGNOSIS — C109 Malignant neoplasm of oropharynx, unspecified: Secondary | ICD-10-CM

## 2013-07-25 DIAGNOSIS — Z5112 Encounter for antineoplastic immunotherapy: Secondary | ICD-10-CM

## 2013-07-25 MED ORDER — SODIUM CHLORIDE 0.9 % IJ SOLN
10.0000 mL | INTRAMUSCULAR | Status: DC | PRN
  Administered 2013-07-25: 10 mL
  Filled 2013-07-25: qty 10

## 2013-07-25 MED ORDER — DIPHENHYDRAMINE HCL 50 MG/ML IJ SOLN
50.0000 mg | Freq: Once | INTRAMUSCULAR | Status: AC
  Administered 2013-07-25: 50 mg via INTRAVENOUS

## 2013-07-25 MED ORDER — HEPARIN SOD (PORK) LOCK FLUSH 100 UNIT/ML IV SOLN
500.0000 [IU] | Freq: Once | INTRAVENOUS | Status: AC | PRN
  Administered 2013-07-25: 500 [IU]
  Filled 2013-07-25: qty 5

## 2013-07-25 MED ORDER — SODIUM CHLORIDE 0.9 % IV SOLN
Freq: Once | INTRAVENOUS | Status: AC
  Administered 2013-07-25: 09:00:00 via INTRAVENOUS

## 2013-07-25 MED ORDER — CETUXIMAB CHEMO IV INJECTION 200 MG/100ML
250.0000 mg/m2 | Freq: Once | INTRAVENOUS | Status: AC
  Administered 2013-07-25: 400 mg via INTRAVENOUS
  Filled 2013-07-25: qty 200

## 2013-07-25 MED ORDER — DIPHENHYDRAMINE HCL 50 MG/ML IJ SOLN
INTRAMUSCULAR | Status: AC
  Filled 2013-07-25: qty 1

## 2013-07-25 NOTE — Progress Notes (Signed)
Weight documented as 110.75 pounds today, July 9 decreased from 112.2 pounds July 1.  Patient reports his appetite remains good.  He is drinking 6 oral nutrition supplements daily.  Providing approximately 2100 calories.  Patient reports he has begun experimenting with blenderized foods.  Severe malnutrition continues.  Nutrition diagnosis: Food and nutrition related knowledge deficit and inadequate oral intake continue.  Intervention: Patient educated to add ice cream to boost plus and continue oral nutrition supplements 6 times a day.  Patient is to consume pured foods as tolerated for additional calories and protein.  If patient can continue 6 oral nutrition supplements daily, providing over 2000 calories, patient should be meeting estimated caloric needs. (42 kcal/kg) and weight should stabilize.  Monitoring, evaluation, goals: Patient will increase oral intake for minimal weight loss.  Next visit: Thursday, July 16, during chemotherapy.    **Disclaimer: This note was dictated with voice recognition software. Similar sounding words can inadvertently be transcribed and this note may contain transcription errors which may not have been corrected upon publication of note.**

## 2013-07-25 NOTE — Patient Instructions (Signed)
[facility] Discharge Instructions for Patients Receiving Chemotherapy  Today you received the following chemotherapy agents ; Erbitux  To help prevent nausea and vomiting after your treatment, we encourage you to take your nausea medication as directed.    If you develop nausea and vomiting that is not controlled by your nausea medication, call the clinic.   BELOW ARE SYMPTOMS THAT SHOULD BE REPORTED IMMEDIATELY:  *FEVER GREATER THAN 100.5 F  *CHILLS WITH OR WITHOUT FEVER  NAUSEA AND VOMITING THAT IS NOT CONTROLLED WITH YOUR NAUSEA MEDICATION  *UNUSUAL SHORTNESS OF BREATH  *UNUSUAL BRUISING OR BLEEDING  TENDERNESS IN MOUTH AND THROAT WITH OR WITHOUT PRESENCE OF ULCERS  *URINARY PROBLEMS  *BOWEL PROBLEMS  UNUSUAL RASH Items with * indicate a potential emergency and should be followed up as soon as possible.  Feel free to call the clinic you have any questions or concerns. The clinic phone number is [phone].    

## 2013-07-29 ENCOUNTER — Ambulatory Visit: Payer: Self-pay

## 2013-07-29 ENCOUNTER — Encounter (INDEPENDENT_AMBULATORY_CARE_PROVIDER_SITE_OTHER): Payer: Self-pay

## 2013-07-29 ENCOUNTER — Encounter (HOSPITAL_COMMUNITY): Payer: Self-pay | Admitting: Dentistry

## 2013-07-29 ENCOUNTER — Ambulatory Visit (HOSPITAL_COMMUNITY): Payer: Medicaid - Dental | Admitting: Dentistry

## 2013-07-29 ENCOUNTER — Ambulatory Visit: Payer: BC Managed Care – PPO

## 2013-07-29 DIAGNOSIS — K08109 Complete loss of teeth, unspecified cause, unspecified class: Secondary | ICD-10-CM

## 2013-07-29 NOTE — Patient Instructions (Signed)
PLAN: 1. Continue saltwater rinses as needed to aid healing. 2. Brush tongue daily. 3. Maintain diet as tolerated. Use nutritional supplements as instructed. 5. Patient is to follow the dentist of his choice for fabrication of upper lower complete dentures 3 months after the last radiation therapy has been completed 6. Patient  to return to dental medicine 2 weeks into radiation therapy. Patient to call for this appointment.  7. patient is performed trismus exercises as directed.  Lenn Cal, DDS  TRISMUS  Trismus is a condition where the jaw does not allow the mouth to open as wide as it usually does.  This can happen almost suddenly, or in other cases the process is so slow, it is hard to notice it-until it is too far along.  When the jaw joints and/or muscles have been exposed to radiation treatments, the onset of Trismus is very slow.  This is because the muscles are losing their stretching ability over a long period of time, as long as 2 YEARS after the end of radiation.  It is therefore important to exercise these muscles and joints.  TRISMUS EXERCISES   Stack of tongue depressors measuring the same or a little less than the last documented MIO (Maximum Interincisal Opening).  Secure them with a rubber band on both ends.  Place the stack in the patient's mouth, supporting the other end.  Allow 30 seconds for muscle stretching.  Rest for a few seconds.  Repeat 3-5 times  For all radiation patients, this exercise is recommended in the mornings and evenings unless otherwise instructed.  The exercise should be done for a period of 2 YEARS after the end of radiation.  MIO should be checked routinely on recall dental visits by the general dentist or the hospital dentist.  The patient is advised to report any changes, soreness, or difficulties encountered when doing the exercises.

## 2013-07-29 NOTE — Progress Notes (Signed)
LIMITED ORAL EXAMINATION      07/29/2013  Derek Morgan 1955/03/17 [id number]  VITALS: BP 125/83  Pulse 102  Temp(Src) 98 F (36.7 C) (Oral)  Wt 110 lb (49.896 kg)  LABS:  Lab Results  Component Value Date   WBC 7.5 07/24/2013   HGB 13.5 07/24/2013   HCT 41.1 07/24/2013   MCV 97.6 07/24/2013   PLT 176 07/24/2013   BMET    Component Value Date/Time   NA 138 07/24/2013 0953   K 3.9 07/24/2013 0953   CO2 30* 07/24/2013 0953   GLUCOSE 157* 07/24/2013 0953   BUN 11.6 07/24/2013 0953   CREATININE 0.7 07/24/2013 0953   CALCIUM 9.8 07/24/2013 0953    No results found for this basename: INR,  PROTIME   No results found for this basename: PTT     Derek Morgan is status post extraction of remaining teeth with alveoloplasty and pre-prosthetic surgery as indicated in the operating room on 06/24/2013.patient was seen for a postoperative examination and suture removal on 07/08/2013. Patient was cleared for chemoradiation therapy at that time. The patient has since had multiple doses of chemotherapy with Dr. Alvy Bimler.  Radiation therapy has not been started at this time.  SUBJECTIVE: The patient denies having any significant oral discomfort or problems with healing . The patient has relatively normal saliva. The patient is eating multiple blenderized meals. Patient also takes about 4-5 cans of Boost per day.    EXAM: Patient is edentulous. There is no sign of oral mucositis or problems with healing. Previous areas of delayed healing in the upper right and upper left molar areas is now covered by soft tissue with no evidence of sinus communication. Valsalva maneuver was negative again.  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 Maximum interincisal opening is 35 mm. Trismus device was fabricated today with 22 sticks.  ASSESSMENT: Post operative course is consistent with dental procedures performed in the operating room on 06/24/2013. Patient is edentulous. There is atrophy of the edentulous  alveolar ridges. The patient has normal saliva.   PLAN: 1. Continue saltwater rinses as needed to aid healing. 2. Brush tongue daily. 3. Maintain diet as tolerated. Use nutritional supplements as instructed. 5. Patient is to follow the dentist of his choice for fabrication of upper lower complete dentures 3 months after the last radiation therapy has been completed 6. Patient  to return to dental medicine 2 weeks into radiation therapy. Patient to call for this appointment.  7. patient is performed trismus exercises as directed.  Lenn Cal, DDS

## 2013-07-31 ENCOUNTER — Encounter: Payer: Self-pay | Admitting: Nutrition

## 2013-07-31 ENCOUNTER — Ambulatory Visit: Payer: Self-pay

## 2013-08-01 ENCOUNTER — Telehealth: Payer: Self-pay | Admitting: Hematology and Oncology

## 2013-08-01 ENCOUNTER — Other Ambulatory Visit (HOSPITAL_BASED_OUTPATIENT_CLINIC_OR_DEPARTMENT_OTHER): Payer: BC Managed Care – PPO

## 2013-08-01 ENCOUNTER — Ambulatory Visit (HOSPITAL_BASED_OUTPATIENT_CLINIC_OR_DEPARTMENT_OTHER): Payer: BC Managed Care – PPO | Admitting: Hematology and Oncology

## 2013-08-01 ENCOUNTER — Ambulatory Visit (HOSPITAL_BASED_OUTPATIENT_CLINIC_OR_DEPARTMENT_OTHER): Payer: BC Managed Care – PPO

## 2013-08-01 ENCOUNTER — Telehealth: Payer: Self-pay | Admitting: *Deleted

## 2013-08-01 ENCOUNTER — Ambulatory Visit: Payer: BC Managed Care – PPO | Admitting: Nutrition

## 2013-08-01 ENCOUNTER — Encounter: Payer: Self-pay | Admitting: *Deleted

## 2013-08-01 ENCOUNTER — Encounter: Payer: Self-pay | Admitting: Hematology and Oncology

## 2013-08-01 VITALS — BP 122/86 | HR 103 | Temp 98.3°F | Resp 18 | Ht 73.0 in | Wt 109.8 lb

## 2013-08-01 DIAGNOSIS — Z720 Tobacco use: Secondary | ICD-10-CM

## 2013-08-01 DIAGNOSIS — C109 Malignant neoplasm of oropharynx, unspecified: Secondary | ICD-10-CM

## 2013-08-01 DIAGNOSIS — R634 Abnormal weight loss: Secondary | ICD-10-CM

## 2013-08-01 DIAGNOSIS — R070 Pain in throat: Secondary | ICD-10-CM

## 2013-08-01 DIAGNOSIS — Z5112 Encounter for antineoplastic immunotherapy: Secondary | ICD-10-CM

## 2013-08-01 DIAGNOSIS — R21 Rash and other nonspecific skin eruption: Secondary | ICD-10-CM

## 2013-08-01 DIAGNOSIS — Z5111 Encounter for antineoplastic chemotherapy: Secondary | ICD-10-CM

## 2013-08-01 DIAGNOSIS — F17200 Nicotine dependence, unspecified, uncomplicated: Secondary | ICD-10-CM

## 2013-08-01 LAB — COMPREHENSIVE METABOLIC PANEL (CC13)
ALBUMIN: 3.1 g/dL — AB (ref 3.5–5.0)
ALT: 33 U/L (ref 0–55)
AST: 27 U/L (ref 5–34)
Alkaline Phosphatase: 74 U/L (ref 40–150)
Anion Gap: 11 mEq/L (ref 3–11)
BUN: 8.6 mg/dL (ref 7.0–26.0)
CALCIUM: 9.9 mg/dL (ref 8.4–10.4)
CHLORIDE: 99 meq/L (ref 98–109)
CO2: 29 mEq/L (ref 22–29)
Creatinine: 0.7 mg/dL (ref 0.7–1.3)
GLUCOSE: 138 mg/dL (ref 70–140)
POTASSIUM: 4.1 meq/L (ref 3.5–5.1)
SODIUM: 138 meq/L (ref 136–145)
TOTAL PROTEIN: 7.7 g/dL (ref 6.4–8.3)
Total Bilirubin: 0.3 mg/dL (ref 0.20–1.20)

## 2013-08-01 LAB — CBC WITH DIFFERENTIAL/PLATELET
BASO%: 0.8 % (ref 0.0–2.0)
Basophils Absolute: 0 10*3/uL (ref 0.0–0.1)
EOS ABS: 0 10*3/uL (ref 0.0–0.5)
EOS%: 0.6 % (ref 0.0–7.0)
HCT: 40.9 % (ref 38.4–49.9)
HEMOGLOBIN: 13.2 g/dL (ref 13.0–17.1)
LYMPH#: 0.7 10*3/uL — AB (ref 0.9–3.3)
LYMPH%: 16.2 % (ref 14.0–49.0)
MCH: 31.6 pg (ref 27.2–33.4)
MCHC: 32.2 g/dL (ref 32.0–36.0)
MCV: 98.1 fL — AB (ref 79.3–98.0)
MONO#: 0.8 10*3/uL (ref 0.1–0.9)
MONO%: 17.8 % — AB (ref 0.0–14.0)
NEUT#: 2.8 10*3/uL (ref 1.5–6.5)
NEUT%: 64.6 % (ref 39.0–75.0)
Platelets: 213 10*3/uL (ref 140–400)
RBC: 4.17 10*6/uL — AB (ref 4.20–5.82)
RDW: 13.7 % (ref 11.0–14.6)
WBC: 4.3 10*3/uL (ref 4.0–10.3)

## 2013-08-01 LAB — MAGNESIUM (CC13): Magnesium: 2.1 mg/dl (ref 1.5–2.5)

## 2013-08-01 MED ORDER — DIPHENHYDRAMINE HCL 50 MG/ML IJ SOLN
50.0000 mg | Freq: Once | INTRAMUSCULAR | Status: AC
  Administered 2013-08-01: 50 mg via INTRAVENOUS

## 2013-08-01 MED ORDER — MORPHINE SULFATE 15 MG PO TABS: 15.0000 mg | ORAL_TABLET | ORAL | Status: DC | PRN

## 2013-08-01 MED ORDER — ONDANSETRON 16 MG/50ML IVPB (CHCC)
16.0000 mg | Freq: Once | INTRAVENOUS | Status: AC
  Administered 2013-08-01: 16 mg via INTRAVENOUS

## 2013-08-01 MED ORDER — SODIUM CHLORIDE 0.9 % IV SOLN
Freq: Once | INTRAVENOUS | Status: AC
  Administered 2013-08-01: 10:00:00 via INTRAVENOUS

## 2013-08-01 MED ORDER — CETUXIMAB CHEMO IV INJECTION 200 MG/100ML
250.0000 mg/m2 | Freq: Once | INTRAVENOUS | Status: AC
Start: 2013-08-01 — End: 2013-08-01
  Administered 2013-08-01: 400 mg via INTRAVENOUS
  Filled 2013-08-01: qty 200

## 2013-08-01 MED ORDER — SODIUM CHLORIDE 0.9 % IV SOLN
508.5000 mg | Freq: Once | INTRAVENOUS | Status: AC
  Administered 2013-08-01: 510 mg via INTRAVENOUS
  Filled 2013-08-01: qty 51

## 2013-08-01 MED ORDER — DEXAMETHASONE SODIUM PHOSPHATE 20 MG/5ML IJ SOLN
20.0000 mg | Freq: Once | INTRAMUSCULAR | Status: AC
Start: 2013-08-01 — End: 2013-08-01
  Administered 2013-08-01: 20 mg via INTRAVENOUS

## 2013-08-01 MED ORDER — LIDOCAINE-PRILOCAINE 2.5-2.5 % EX CREA: 1.0000 "application " | TOPICAL_CREAM | CUTANEOUS | Status: DC | PRN

## 2013-08-01 MED ORDER — DEXAMETHASONE SODIUM PHOSPHATE 20 MG/5ML IJ SOLN
INTRAMUSCULAR | Status: AC
  Filled 2013-08-01: qty 5

## 2013-08-01 MED ORDER — DIPHENHYDRAMINE HCL 50 MG/ML IJ SOLN
INTRAMUSCULAR | Status: AC
  Filled 2013-08-01: qty 1

## 2013-08-01 MED ORDER — FLUOROURACIL CHEMO INJECTION 5 GM/100ML
1000.0000 mg/m2/d | INTRAVENOUS | Status: DC
  Administered 2013-08-01: 6650 mg via INTRAVENOUS
  Filled 2013-08-01: qty 133

## 2013-08-01 MED ORDER — ONDANSETRON 16 MG/50ML IVPB (CHCC)
INTRAVENOUS | Status: AC
  Filled 2013-08-01: qty 16

## 2013-08-01 NOTE — Telephone Encounter (Signed)
Per [facility] staff message scheduled appts. Advised scheduler 

## 2013-08-01 NOTE — Progress Notes (Signed)
To provide support, encouragement and care continuity, met with patient during est pt appt with Dr.Gorsuch.  He states he continues to work while receiving treatments, not experiencing any chemo SEs.  He reports that he spoke with his neighbor about serving as his HCPOA; he is "thinking about it".  I encouraged him to identify a HCPOA so that if in the future he is unable to make health-care decisions there is someone who can speak on his behalf.  He verbalized understanding.  Continuing navigation as L2 patient (treatments established).  Gayleen Orem, RN, BSN, [facility] Neck Oncology Navigator [phone]

## 2013-08-01 NOTE — Telephone Encounter (Signed)
Pt confirmed labs/ov per 07/16 POF, gave pt AVS......KJ °

## 2013-08-01 NOTE — Progress Notes (Signed)
Nutrition followup completed with patient during chemotherapy.  Weight documented as 109.8 pounds July 16.  Essentially stable from last visit.  Patient states appetite remains good.  He is drinking 6 oral nutrition supplements daily.  Providing approximately 2100 calories.  Patient does continue to blenderize foods.  He has no nutrition concerns today.  Severe malnutrition continues.  Nutrition diagnosis: Food and nutrition related knowledge deficit and inadequate oral intake have improved.  Intervention: Patient educated to continue strategies for high-calorie, high-protein meals and snacks, along with 6 oral nutrition supplements daily.  Reviewed importance of consistent oral intake for weight stabilization.  Teach back method used.  Monitoring, evaluation, goals: Patient is working at increasing oral intake and weight has been stable.  Next visit: Thursday, July 23, during chemotherapy.   **Disclaimer: This note was dictated with voice recognition software. Similar sounding words can inadvertently be transcribed and this note may contain transcription errors which may not have been corrected upon publication of note.**

## 2013-08-01 NOTE — Progress Notes (Signed)
[facility] OFFICE PROGRESS NOTE  Patient Care Team: No Pcp Per Patient as PCP - General (General Practice) Heath Lark, MD as Consulting Physician (Hematology and Oncology) Ascencion Dike, MD as Consulting Physician (Otolaryngology) Brooks Sailors, RN as Oncology Nurse Navigator (Oncology)  SUMMARY OF ONCOLOGIC HISTORY: Oncology History   Oropharyngeal cancer, HPV negative   Primary site: Pharynx - Oropharynx   Staging method: AJCC 7th Edition   Clinical: Stage IVC (T4b, N3, M1) signed by Heath Lark, MD on 07/01/2013  8:32 PM   Summary: Stage IVC (T4b, N3, M1)         Oropharyngeal cancer   05/06/2013 Imaging 4.2 x 4.7 x 6.1 cm heterogeneous right neck mass in the right level 2 & level 3 stations. It invades the carotid space, obstructing the internal jugular vein. The primary lesion may be along the inferior aspect of the right palatine tonsil    05/22/2013 Procedure Laryngoscopy revealed abnormalities beneath the right tonsil area. Unfortunately tonsillectomy and right neck biopsy came back benign tissue.   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   06/24/2013 Surgery He underwent multiple extraction of tooth numbers 2, 3, 6, 8, 9, 11, 12, 13, 14, 15, 19, 20, 22, 23, 24, 25, 26, 27, and 28 along with 4 Quadrants of alveoloplasty   06/28/2013 Imaging PET/CT scan results show widespread pulmonary metastasis.   07/11/2013 -  Chemotherapy The patient is started on palliative chemotherapy with 5-FU, carboplatin and weekly Erbitux.    INTERVAL HISTORY: Please see below for problem oriented charting. He is tolerating treatment very well. He continues to use approximately 6 tablets of oral morphine daily for pain. He is rating his pain a 0/10 pain. He has some mild skin rash. The patient denies any mouth sores, nausea, vomiting or change in bowel habits The lump on the right side of his neck is getting smaller.  REVIEW OF SYSTEMS:    Constitutional: Denies fevers, chills  Eyes: Denies blurriness of vision Respiratory: Denies cough, dyspnea or wheezes Cardiovascular: Denies palpitation, chest discomfort or lower extremity swelling Gastrointestinal:  Denies nausea, heartburn or change in bowel habits Lymphatics: Denies new lymphadenopathy or easy bruising Neurological:Denies numbness, tingling or new weaknesses Behavioral/Psych: Mood is stable, no new changes  All other systems were reviewed with the patient and are negative.  I have reviewed the past medical history, past surgical history, social history and family history with the patient and they are unchanged from previous note.  ALLERGIES:  has No Known Allergies.  MEDICATIONS:  Current Outpatient Prescriptions  Medication Sig Dispense Refill  . acetaminophen (TYLENOL) 500 MG tablet Take 500 mg by mouth every 6 (six) hours as needed for mild pain.       Marland Kitchen docusate sodium (COLACE) 100 MG capsule Take 1 capsule (100 mg total) by mouth 2 (two) times daily.  60 capsule  0  . lidocaine-prilocaine (EMLA) cream Apply 1 application topically as needed.  30 g  6  . morphine (MSIR) 15 MG tablet Take 1 tablet (15 mg total) by mouth every 3 (three) hours as needed for severe pain.  90 tablet  0  . ondansetron (ZOFRAN) 8 MG tablet Take 1 tablet (8 mg total) by mouth every 8 (eight) hours as needed for nausea or vomiting.  30 tablet  1  . prochlorperazine (COMPAZINE) 10 MG tablet Take 1 tablet (10 mg total) by mouth every 6 (six) hours as needed (Nausea or vomiting).  [place]  tablet  1   No current facility-administered medications for this visit.    PHYSICAL EXAMINATION: ECOG PERFORMANCE STATUS: 1 - Symptomatic but completely ambulatory  Filed Vitals:   08/01/13 0835  BP: 122/86  Pulse: 103  Temp: 98.3 F (36.8 C)  Resp: 18   Filed Weights   08/01/13 0835  Weight: 109 lb 12.8 oz (49.805 kg)    GENERAL:alert, no distress and comfortable. He looks thin and  cachectic SKIN: Mild grade 1 acneform rash on his face. No skin ulceration. EYES: normal, Conjunctiva are pink and non-injected, sclera clear OROPHARYNX:no exudate, no erythema and lips, buccal mucosa, and tongue normal . No mucositis NECK: supple, thyroid normal size, non-tender, without nodularity LYMPH:  The lymph node on the right side of his neck is getting smaller.  LUNGS: clear to auscultation and percussion with normal breathing effort HEART: regular rate & rhythm and no murmurs and no lower extremity edema ABDOMEN:abdomen soft, non-tender and normal bowel sounds Musculoskeletal:no cyanosis of digits and no clubbing  NEURO: alert & oriented x 3 with fluent speech, no focal motor/sensory deficits  LABORATORY DATA:  I have reviewed the data as listed    Component Value Date/Time   NA 138 08/01/2013 0818   K 4.1 08/01/2013 0818   CO2 29 08/01/2013 0818   GLUCOSE 138 08/01/2013 0818   BUN 8.6 08/01/2013 0818   CREATININE 0.7 08/01/2013 0818   CALCIUM 9.9 08/01/2013 0818   PROT 7.7 08/01/2013 0818   ALBUMIN 3.1* 08/01/2013 0818   AST 27 08/01/2013 0818   ALT 33 08/01/2013 0818   ALKPHOS 74 08/01/2013 0818   BILITOT 0.30 08/01/2013 0818    No results found for this basename: SPEP, UPEP,  kappa and lambda light chains    Lab Results  Component Value Date   WBC 4.3 08/01/2013   NEUTROABS 2.8 08/01/2013   HGB 13.2 08/01/2013   HCT 40.9 08/01/2013   MCV 98.1* 08/01/2013   PLT 213 08/01/2013      Chemistry      Component Value Date/Time   NA 138 08/01/2013 0818   K 4.1 08/01/2013 0818   CO2 29 08/01/2013 0818   BUN 8.6 08/01/2013 0818   CREATININE 0.7 08/01/2013 0818      Component Value Date/Time   CALCIUM 9.9 08/01/2013 0818   ALKPHOS 74 08/01/2013 0818   AST 27 08/01/2013 0818   ALT 33 08/01/2013 0818   BILITOT 0.30 08/01/2013 0818      ASSESSMENT & PLAN:  Oropharyngeal cancer Overall, he tolerated treatment well. Continue treatment without dosage adjustment. I will see him again  prior to cycle 3. Visibly, the size of his tumor has regressed.    Skin rash He has mild grade 1 skin toxicity from cetuximab. I recommend using over-the-counter hydrocortisone cream with Benadryl as needed. If the skin rash gets worse, we might have to use topical antibiotic cream or oral antibiotic therapy. I told the patient to avoid excessive sun exposure.  Throat pain I refilled his prescription morphine sulfate. We discussed about narcotic refill policy.    Tobacco abuse He has quit smoking. Periodically, he would smoke one or 2 cigarette. I recommend he continues to stay abstinent from smoking again.  Weight loss He lost 1 more pound of weight. I recommend he increase oral supplement intake. He denies dysphagia.     No orders of the defined types were placed in this encounter.   All questions were answered. The patient knows to call the  clinic with any problems, questions or concerns. No barriers to learning was detected. I spent 40 minutes counseling the patient face to face. The total time spent in the appointment was 55 minutes and more than 50% was on counseling and review of test results     [facility], [place], MD 08/01/2013 9:06 AM

## 2013-08-01 NOTE — Assessment & Plan Note (Signed)
I refilled his prescription morphine sulfate. We discussed about narcotic refill policy.

## 2013-08-01 NOTE — Assessment & Plan Note (Signed)
He has quit smoking. Periodically, he would smoke one or 2 cigarette. I recommend he continues to stay abstinent from smoking again.

## 2013-08-01 NOTE — Assessment & Plan Note (Signed)
Overall, he tolerated treatment well. Continue treatment without dosage adjustment. I will see him again prior to cycle 3. Visibly, the size of his tumor has regressed.

## 2013-08-01 NOTE — Assessment & Plan Note (Signed)
He lost 1 more pound of weight. I recommend he increase oral supplement intake. He denies dysphagia.

## 2013-08-01 NOTE — Patient Instructions (Signed)
[facility] Discharge Instructions for Patients Receiving Chemotherapy  Today you received the following chemotherapy agents:  Erbitux, Carboplatin and 5FU  To help prevent nausea and vomiting after your treatment, we encourage you to take your nausea medication as ordered per MD.   If you develop nausea and vomiting that is not controlled by your nausea medication, call the clinic.   BELOW ARE SYMPTOMS THAT SHOULD BE REPORTED IMMEDIATELY:  *FEVER GREATER THAN 100.5 F  *CHILLS WITH OR WITHOUT FEVER  NAUSEA AND VOMITING THAT IS NOT CONTROLLED WITH YOUR NAUSEA MEDICATION  *UNUSUAL SHORTNESS OF BREATH  *UNUSUAL BRUISING OR BLEEDING  TENDERNESS IN MOUTH AND THROAT WITH OR WITHOUT PRESENCE OF ULCERS  *URINARY PROBLEMS  *BOWEL PROBLEMS  UNUSUAL RASH Items with * indicate a potential emergency and should be followed up as soon as possible.  Feel free to call the clinic you have any questions or concerns. The clinic phone number is [phone].

## 2013-08-01 NOTE — Assessment & Plan Note (Signed)
He has mild grade 1 skin toxicity from cetuximab. I recommend using over-the-counter hydrocortisone cream with Benadryl as needed. If the skin rash gets worse, we might have to use topical antibiotic cream or oral antibiotic therapy. I told the patient to avoid excessive sun exposure.

## 2013-08-05 ENCOUNTER — Other Ambulatory Visit: Payer: Self-pay

## 2013-08-05 ENCOUNTER — Ambulatory Visit (HOSPITAL_BASED_OUTPATIENT_CLINIC_OR_DEPARTMENT_OTHER): Payer: BC Managed Care – PPO

## 2013-08-05 DIAGNOSIS — Z452 Encounter for adjustment and management of vascular access device: Secondary | ICD-10-CM

## 2013-08-05 DIAGNOSIS — C109 Malignant neoplasm of oropharynx, unspecified: Secondary | ICD-10-CM

## 2013-08-05 MED ORDER — SODIUM CHLORIDE 0.9 % IJ SOLN
10.0000 mL | INTRAMUSCULAR | Status: DC | PRN
  Administered 2013-08-05: 10 mL
  Filled 2013-08-05: qty 10

## 2013-08-05 MED ORDER — HEPARIN SOD (PORK) LOCK FLUSH 100 UNIT/ML IV SOLN
500.0000 [IU] | Freq: Once | INTRAVENOUS | Status: AC | PRN
  Administered 2013-08-05: 500 [IU]
  Filled 2013-08-05: qty 5

## 2013-08-05 NOTE — Patient Instructions (Signed)

## 2013-08-05 NOTE — Progress Notes (Signed)
Patient has 9.9cc chemo left.  Per Dr Alvy Bimler stop infusion.  Infusion stopped, port flushed.

## 2013-08-08 ENCOUNTER — Other Ambulatory Visit (HOSPITAL_BASED_OUTPATIENT_CLINIC_OR_DEPARTMENT_OTHER): Payer: BC Managed Care – PPO

## 2013-08-08 ENCOUNTER — Ambulatory Visit: Payer: BC Managed Care – PPO | Admitting: Nutrition

## 2013-08-08 ENCOUNTER — Ambulatory Visit (HOSPITAL_BASED_OUTPATIENT_CLINIC_OR_DEPARTMENT_OTHER): Payer: BC Managed Care – PPO

## 2013-08-08 VITALS — BP 130/78 | HR 98 | Temp 96.9°F | Wt 107.8 lb

## 2013-08-08 DIAGNOSIS — C109 Malignant neoplasm of oropharynx, unspecified: Secondary | ICD-10-CM

## 2013-08-08 DIAGNOSIS — Z5112 Encounter for antineoplastic immunotherapy: Secondary | ICD-10-CM

## 2013-08-08 DIAGNOSIS — R070 Pain in throat: Secondary | ICD-10-CM

## 2013-08-08 LAB — CBC WITH DIFFERENTIAL/PLATELET
BASO%: 0.4 % (ref 0.0–2.0)
Basophils Absolute: 0 10*3/uL (ref 0.0–0.1)
EOS%: 0.6 % (ref 0.0–7.0)
Eosinophils Absolute: 0 10*3/uL (ref 0.0–0.5)
HCT: 37.2 % — ABNORMAL LOW (ref 38.4–49.9)
HGB: 12.4 g/dL — ABNORMAL LOW (ref 13.0–17.1)
LYMPH%: 12.2 % — ABNORMAL LOW (ref 14.0–49.0)
MCH: 31.6 pg (ref 27.2–33.4)
MCHC: 33.3 g/dL (ref 32.0–36.0)
MCV: 94.9 fL (ref 79.3–98.0)
MONO#: 0.1 10*3/uL (ref 0.1–0.9)
MONO%: 2.2 % (ref 0.0–14.0)
NEUT%: 84.6 % — ABNORMAL HIGH (ref 39.0–75.0)
NEUTROS ABS: 4.3 10*3/uL (ref 1.5–6.5)
NRBC: 0 % (ref 0–0)
PLATELETS: 225 10*3/uL (ref 140–400)
RBC: 3.92 10*6/uL — AB (ref 4.20–5.82)
RDW: 13.6 % (ref 11.0–14.6)
WBC: 5 10*3/uL (ref 4.0–10.3)
lymph#: 0.6 10*3/uL — ABNORMAL LOW (ref 0.9–3.3)

## 2013-08-08 LAB — COMPREHENSIVE METABOLIC PANEL (CC13)
ALT: 33 U/L (ref 0–55)
AST: 33 U/L (ref 5–34)
Albumin: 3.1 g/dL — ABNORMAL LOW (ref 3.5–5.0)
Alkaline Phosphatase: 61 U/L (ref 40–150)
Anion Gap: 10 mEq/L (ref 3–11)
BUN: 12.6 mg/dL (ref 7.0–26.0)
CALCIUM: 9.6 mg/dL (ref 8.4–10.4)
CHLORIDE: 100 meq/L (ref 98–109)
CO2: 28 mEq/L (ref 22–29)
CREATININE: 0.7 mg/dL (ref 0.7–1.3)
Glucose: 171 mg/dl — ABNORMAL HIGH (ref 70–140)
Potassium: 3.6 mEq/L (ref 3.5–5.1)
Sodium: 138 mEq/L (ref 136–145)
Total Bilirubin: 0.41 mg/dL (ref 0.20–1.20)
Total Protein: 7.2 g/dL (ref 6.4–8.3)

## 2013-08-08 LAB — MAGNESIUM (CC13): Magnesium: 2.1 mg/dl (ref 1.5–2.5)

## 2013-08-08 MED ORDER — DIPHENHYDRAMINE HCL 50 MG/ML IJ SOLN
50.0000 mg | Freq: Once | INTRAMUSCULAR | Status: AC
  Administered 2013-08-08: 50 mg via INTRAVENOUS

## 2013-08-08 MED ORDER — CETUXIMAB CHEMO IV INJECTION 200 MG/100ML
250.0000 mg/m2 | Freq: Once | INTRAVENOUS | Status: AC
  Administered 2013-08-08: 400 mg via INTRAVENOUS
  Filled 2013-08-08: qty 200

## 2013-08-08 MED ORDER — SODIUM CHLORIDE 0.9 % IJ SOLN
10.0000 mL | INTRAMUSCULAR | Status: DC | PRN
  Administered 2013-08-08: 10 mL
  Filled 2013-08-08: qty 10

## 2013-08-08 MED ORDER — HEPARIN SOD (PORK) LOCK FLUSH 100 UNIT/ML IV SOLN
500.0000 [IU] | Freq: Once | INTRAVENOUS | Status: AC | PRN
  Administered 2013-08-08: 500 [IU]
  Filled 2013-08-08: qty 5

## 2013-08-08 MED ORDER — SODIUM CHLORIDE 0.9 % IV SOLN
Freq: Once | INTRAVENOUS | Status: AC
  Administered 2013-08-08: 09:00:00 via INTRAVENOUS

## 2013-08-08 MED ORDER — DIPHENHYDRAMINE HCL 50 MG/ML IJ SOLN
INTRAMUSCULAR | Status: AC
  Filled 2013-08-08: qty 1

## 2013-08-08 NOTE — Patient Instructions (Signed)
[facility] Discharge Instructions for Patients Receiving Chemotherapy  Today you received the following chemotherapy agents erbitux  To help prevent nausea and vomiting after your treatment, we encourage you to take your nausea medication as directed  If you develop nausea and vomiting that is not controlled by your nausea medication, call the clinic.   BELOW ARE SYMPTOMS THAT SHOULD BE REPORTED IMMEDIATELY:  *FEVER GREATER THAN 100.5 F  *CHILLS WITH OR WITHOUT FEVER  NAUSEA AND VOMITING THAT IS NOT CONTROLLED WITH YOUR NAUSEA MEDICATION  *UNUSUAL SHORTNESS OF BREATH  *UNUSUAL BRUISING OR BLEEDING  TENDERNESS IN MOUTH AND THROAT WITH OR WITHOUT PRESENCE OF ULCERS  *URINARY PROBLEMS  *BOWEL PROBLEMS  UNUSUAL RASH Items with * indicate a potential emergency and should be followed up as soon as possible.  Feel free to call the clinic you have any questions or concerns. The clinic phone number is [phone].  

## 2013-08-08 NOTE — Progress Notes (Signed)
Nutrition followup completed with patient.  Weight documented as 107.7 pounds today, July 23.  Patient has had continued weight loss.  Patient reports his appetite is good and he continues to drink 6 oral nutrition supplements daily.  Providing approximately 2100 calories.  Patient reports he has eaten tuna recently.  Severe malnutrition continues.  Nutrition diagnosis: Food and nutrition related knowledge deficit and inadequate oral intake have continued.  Intervention: Stressed the importance of patient consuming 6 Boost Plus daily.  Consistent oral intake is important to promote weight stabilization.  Encouraged soft, moist foods as tolerated.  Teach back method used.  Recommended patient receive feeding tube for nutrition support.  Monitoring, evaluation, goals: Patient will work to increase oral intake to promote weight stabilization.  Next visit: Thursday, July 30.  **Disclaimer: This note was dictated with voice recognition software. Similar sounding words can inadvertently be transcribed and this note may contain transcription errors which may not have been corrected upon publication of note.**

## 2013-08-15 ENCOUNTER — Ambulatory Visit: Payer: BC Managed Care – PPO | Admitting: Nutrition

## 2013-08-15 ENCOUNTER — Other Ambulatory Visit: Payer: Self-pay | Admitting: Hematology and Oncology

## 2013-08-15 ENCOUNTER — Other Ambulatory Visit (HOSPITAL_BASED_OUTPATIENT_CLINIC_OR_DEPARTMENT_OTHER): Payer: BC Managed Care – PPO

## 2013-08-15 ENCOUNTER — Ambulatory Visit (HOSPITAL_BASED_OUTPATIENT_CLINIC_OR_DEPARTMENT_OTHER): Payer: BC Managed Care – PPO

## 2013-08-15 VITALS — BP 173/90 | HR 82 | Temp 97.0°F | Resp 18

## 2013-08-15 DIAGNOSIS — C109 Malignant neoplasm of oropharynx, unspecified: Secondary | ICD-10-CM

## 2013-08-15 DIAGNOSIS — R070 Pain in throat: Secondary | ICD-10-CM

## 2013-08-15 DIAGNOSIS — Z5112 Encounter for antineoplastic immunotherapy: Secondary | ICD-10-CM

## 2013-08-15 LAB — COMPREHENSIVE METABOLIC PANEL (CC13)
ALBUMIN: 3.2 g/dL — AB (ref 3.5–5.0)
ALT: 31 U/L (ref 0–55)
ANION GAP: 8 meq/L (ref 3–11)
AST: 32 U/L (ref 5–34)
Alkaline Phosphatase: 69 U/L (ref 40–150)
BUN: 13.1 mg/dL (ref 7.0–26.0)
CALCIUM: 9.7 mg/dL (ref 8.4–10.4)
CO2: 29 meq/L (ref 22–29)
Chloride: 100 mEq/L (ref 98–109)
Creatinine: 0.7 mg/dL (ref 0.7–1.3)
GLUCOSE: 121 mg/dL (ref 70–140)
POTASSIUM: 4.5 meq/L (ref 3.5–5.1)
Sodium: 137 mEq/L (ref 136–145)
TOTAL PROTEIN: 7.6 g/dL (ref 6.4–8.3)
Total Bilirubin: 0.28 mg/dL (ref 0.20–1.20)

## 2013-08-15 LAB — CBC WITH DIFFERENTIAL/PLATELET
BASO%: 0.5 % (ref 0.0–2.0)
BASOS ABS: 0 10*3/uL (ref 0.0–0.1)
EOS ABS: 0 10*3/uL (ref 0.0–0.5)
EOS%: 0.2 % (ref 0.0–7.0)
HCT: 36.8 % — ABNORMAL LOW (ref 38.4–49.9)
HEMOGLOBIN: 11.9 g/dL — AB (ref 13.0–17.1)
LYMPH%: 19 % (ref 14.0–49.0)
MCH: 31.1 pg (ref 27.2–33.4)
MCHC: 32.3 g/dL (ref 32.0–36.0)
MCV: 96.1 fL (ref 79.3–98.0)
MONO#: 0.6 10*3/uL (ref 0.1–0.9)
MONO%: 14.9 % — ABNORMAL HIGH (ref 0.0–14.0)
NEUT%: 65.4 % (ref 39.0–75.0)
NEUTROS ABS: 2.7 10*3/uL (ref 1.5–6.5)
PLATELETS: 87 10*3/uL — AB (ref 140–400)
RBC: 3.83 10*6/uL — AB (ref 4.20–5.82)
RDW: 13.8 % (ref 11.0–14.6)
WBC: 4.1 10*3/uL (ref 4.0–10.3)
lymph#: 0.8 10*3/uL — ABNORMAL LOW (ref 0.9–3.3)

## 2013-08-15 LAB — MAGNESIUM (CC13): Magnesium: 2.1 mg/dl (ref 1.5–2.5)

## 2013-08-15 MED ORDER — DIPHENHYDRAMINE HCL 50 MG/ML IJ SOLN
INTRAMUSCULAR | Status: AC
  Filled 2013-08-15: qty 1

## 2013-08-15 MED ORDER — SODIUM CHLORIDE 0.9 % IJ SOLN
10.0000 mL | INTRAMUSCULAR | Status: DC | PRN
  Administered 2013-08-15: 10 mL
  Filled 2013-08-15: qty 10

## 2013-08-15 MED ORDER — HEPARIN SOD (PORK) LOCK FLUSH 100 UNIT/ML IV SOLN
500.0000 [IU] | Freq: Once | INTRAVENOUS | Status: AC | PRN
  Administered 2013-08-15: 500 [IU]
  Filled 2013-08-15: qty 5

## 2013-08-15 MED ORDER — DIPHENHYDRAMINE HCL 50 MG/ML IJ SOLN
50.0000 mg | Freq: Once | INTRAMUSCULAR | Status: AC
  Administered 2013-08-15: 50 mg via INTRAVENOUS

## 2013-08-15 MED ORDER — SODIUM CHLORIDE 0.9 % IV SOLN
Freq: Once | INTRAVENOUS | Status: AC
  Administered 2013-08-15: 10:00:00 via INTRAVENOUS

## 2013-08-15 MED ORDER — CETUXIMAB CHEMO IV INJECTION 200 MG/100ML
250.0000 mg/m2 | Freq: Once | INTRAVENOUS | Status: AC
  Administered 2013-08-15: 400 mg via INTRAVENOUS
  Filled 2013-08-15: qty 200

## 2013-08-15 NOTE — Progress Notes (Signed)
Nutrition followup completed with patient during chemotherapy.  Patient continues to state that he is well.  There is no new weight documented.  Last weight 107.7 pounds July 23.  Patient reports he continues to try to drink 6 boost plus daily.  This sometimes gives him diarrhea.  Patient continues to try to eat soft foods.  Severe malnutrition continues.  Nutrition diagnosis: Food and nutrition related knowledge deficit resolved.  Inadequate oral intake continues.  Intervention: Encouraged patient to increase oral intake of foods 6 times a day along with boost +6 times a day.   Provided samples of boost plus for patient.   I educated patient on strategies for consuming product slowly so as not to aggravate Diarrhea.   Questions answered and teach back method used.  Monitoring, evaluation, goals: Patient will work to increase oral intake.  I will continue to work with patient to improve malnutrition.  Next visit: Thursday, August 6, during chemotherapy.  **Disclaimer: This note was dictated with voice recognition software. Similar sounding words can inadvertently be transcribed and this note may contain transcription errors which may not have been corrected upon publication of note.**

## 2013-08-15 NOTE — Progress Notes (Signed)
Ok to treat today with low Platelet count per Dr. Alvy Bimler.

## 2013-08-15 NOTE — Patient Instructions (Signed)
[facility] Discharge Instructions for Patients Receiving Chemotherapy  Today you received the following chemotherapy agents ; Erbitux  To help prevent nausea and vomiting after your treatment, we encourage you to take your nausea medication as directed.    If you develop nausea and vomiting that is not controlled by your nausea medication, call the clinic.   BELOW ARE SYMPTOMS THAT SHOULD BE REPORTED IMMEDIATELY:  *FEVER GREATER THAN 100.5 F  *CHILLS WITH OR WITHOUT FEVER  NAUSEA AND VOMITING THAT IS NOT CONTROLLED WITH YOUR NAUSEA MEDICATION  *UNUSUAL SHORTNESS OF BREATH  *UNUSUAL BRUISING OR BLEEDING  TENDERNESS IN MOUTH AND THROAT WITH OR WITHOUT PRESENCE OF ULCERS  *URINARY PROBLEMS  *BOWEL PROBLEMS  UNUSUAL RASH Items with * indicate a potential emergency and should be followed up as soon as possible.  Feel free to call the clinic you have any questions or concerns. The clinic phone number is [phone].    

## 2013-08-21 ENCOUNTER — Encounter: Payer: Self-pay | Admitting: *Deleted

## 2013-08-22 ENCOUNTER — Ambulatory Visit: Payer: BC Managed Care – PPO | Admitting: Nutrition

## 2013-08-22 ENCOUNTER — Encounter: Payer: Self-pay | Admitting: Hematology and Oncology

## 2013-08-22 ENCOUNTER — Encounter: Payer: Self-pay | Admitting: *Deleted

## 2013-08-22 ENCOUNTER — Other Ambulatory Visit (HOSPITAL_BASED_OUTPATIENT_CLINIC_OR_DEPARTMENT_OTHER): Payer: BC Managed Care – PPO

## 2013-08-22 ENCOUNTER — Ambulatory Visit (HOSPITAL_BASED_OUTPATIENT_CLINIC_OR_DEPARTMENT_OTHER): Payer: BC Managed Care – PPO

## 2013-08-22 ENCOUNTER — Ambulatory Visit (HOSPITAL_BASED_OUTPATIENT_CLINIC_OR_DEPARTMENT_OTHER): Payer: BC Managed Care – PPO | Admitting: Hematology and Oncology

## 2013-08-22 VITALS — BP 138/76 | HR 92 | Temp 97.4°F | Resp 18 | Ht 73.0 in | Wt 110.4 lb

## 2013-08-22 DIAGNOSIS — D72819 Decreased white blood cell count, unspecified: Secondary | ICD-10-CM

## 2013-08-22 DIAGNOSIS — T50905A Adverse effect of unspecified drugs, medicaments and biological substances, initial encounter: Secondary | ICD-10-CM

## 2013-08-22 DIAGNOSIS — D701 Agranulocytosis secondary to cancer chemotherapy: Secondary | ICD-10-CM | POA: Insufficient documentation

## 2013-08-22 DIAGNOSIS — F17200 Nicotine dependence, unspecified, uncomplicated: Secondary | ICD-10-CM

## 2013-08-22 DIAGNOSIS — R21 Rash and other nonspecific skin eruption: Secondary | ICD-10-CM

## 2013-08-22 DIAGNOSIS — T451X5A Adverse effect of antineoplastic and immunosuppressive drugs, initial encounter: Secondary | ICD-10-CM

## 2013-08-22 DIAGNOSIS — C109 Malignant neoplasm of oropharynx, unspecified: Secondary | ICD-10-CM

## 2013-08-22 DIAGNOSIS — D630 Anemia in neoplastic disease: Secondary | ICD-10-CM | POA: Insufficient documentation

## 2013-08-22 DIAGNOSIS — Z5112 Encounter for antineoplastic immunotherapy: Secondary | ICD-10-CM

## 2013-08-22 DIAGNOSIS — D6959 Other secondary thrombocytopenia: Secondary | ICD-10-CM

## 2013-08-22 DIAGNOSIS — R070 Pain in throat: Secondary | ICD-10-CM

## 2013-08-22 DIAGNOSIS — Z720 Tobacco use: Secondary | ICD-10-CM

## 2013-08-22 DIAGNOSIS — Z5111 Encounter for antineoplastic chemotherapy: Secondary | ICD-10-CM

## 2013-08-22 LAB — CBC WITH DIFFERENTIAL/PLATELET
BASO%: 0.4 % (ref 0.0–2.0)
BASOS ABS: 0 10*3/uL (ref 0.0–0.1)
EOS ABS: 0 10*3/uL (ref 0.0–0.5)
EOS%: 0.4 % (ref 0.0–7.0)
HCT: 36.1 % — ABNORMAL LOW (ref 38.4–49.9)
HGB: 11.7 g/dL — ABNORMAL LOW (ref 13.0–17.1)
LYMPH#: 0.8 10*3/uL — AB (ref 0.9–3.3)
LYMPH%: 20.2 % (ref 14.0–49.0)
MCH: 31.7 pg (ref 27.2–33.4)
MCHC: 32.4 g/dL (ref 32.0–36.0)
MCV: 97.7 fL (ref 79.3–98.0)
MONO#: 0.5 10*3/uL (ref 0.1–0.9)
MONO%: 13.7 % (ref 0.0–14.0)
NEUT%: 65.3 % (ref 39.0–75.0)
NEUTROS ABS: 2.4 10*3/uL (ref 1.5–6.5)
Platelets: 122 10*3/uL — ABNORMAL LOW (ref 140–400)
RBC: 3.69 10*6/uL — AB (ref 4.20–5.82)
RDW: 14.5 % (ref 11.0–14.6)
WBC: 3.7 10*3/uL — ABNORMAL LOW (ref 4.0–10.3)

## 2013-08-22 LAB — COMPREHENSIVE METABOLIC PANEL (CC13)
ALBUMIN: 3 g/dL — AB (ref 3.5–5.0)
ALT: 26 U/L (ref 0–55)
AST: 27 U/L (ref 5–34)
Alkaline Phosphatase: 67 U/L (ref 40–150)
Anion Gap: 8 mEq/L (ref 3–11)
BUN: 11.7 mg/dL (ref 7.0–26.0)
CALCIUM: 9.6 mg/dL (ref 8.4–10.4)
CHLORIDE: 99 meq/L (ref 98–109)
CO2: 30 mEq/L — ABNORMAL HIGH (ref 22–29)
Creatinine: 0.7 mg/dL (ref 0.7–1.3)
Glucose: 174 mg/dl — ABNORMAL HIGH (ref 70–140)
POTASSIUM: 4.4 meq/L (ref 3.5–5.1)
SODIUM: 137 meq/L (ref 136–145)
Total Bilirubin: 0.32 mg/dL (ref 0.20–1.20)
Total Protein: 7.6 g/dL (ref 6.4–8.3)

## 2013-08-22 LAB — MAGNESIUM (CC13): Magnesium: 2.1 mg/dl (ref 1.5–2.5)

## 2013-08-22 MED ORDER — DEXAMETHASONE SODIUM PHOSPHATE 20 MG/5ML IJ SOLN
20.0000 mg | Freq: Once | INTRAMUSCULAR | Status: AC
  Administered 2013-08-22: 20 mg via INTRAVENOUS

## 2013-08-22 MED ORDER — SODIUM CHLORIDE 0.9 % IV SOLN
508.5000 mg | Freq: Once | INTRAVENOUS | Status: AC
  Administered 2013-08-22: 510 mg via INTRAVENOUS
  Filled 2013-08-22: qty 51

## 2013-08-22 MED ORDER — DIPHENHYDRAMINE HCL 50 MG/ML IJ SOLN
INTRAMUSCULAR | Status: AC
  Filled 2013-08-22: qty 1

## 2013-08-22 MED ORDER — DIPHENHYDRAMINE HCL 50 MG/ML IJ SOLN
50.0000 mg | Freq: Once | INTRAMUSCULAR | Status: AC
  Administered 2013-08-22: 50 mg via INTRAVENOUS

## 2013-08-22 MED ORDER — ONDANSETRON 16 MG/50ML IVPB (CHCC)
INTRAVENOUS | Status: AC
  Filled 2013-08-22: qty 16

## 2013-08-22 MED ORDER — CETUXIMAB CHEMO IV INJECTION 200 MG/100ML
250.0000 mg/m2 | Freq: Once | INTRAVENOUS | Status: AC
  Administered 2013-08-22: 400 mg via INTRAVENOUS
  Filled 2013-08-22: qty 200

## 2013-08-22 MED ORDER — SODIUM CHLORIDE 0.9 % IV SOLN
1000.0000 mg/m2/d | INTRAVENOUS | Status: DC
  Administered 2013-08-22: 6650 mg via INTRAVENOUS
  Filled 2013-08-22: qty 133

## 2013-08-22 MED ORDER — MORPHINE SULFATE 15 MG PO TABS: 15.0000 mg | ORAL_TABLET | ORAL | Status: DC | PRN

## 2013-08-22 MED ORDER — DEXAMETHASONE SODIUM PHOSPHATE 20 MG/5ML IJ SOLN
INTRAMUSCULAR | Status: AC
  Filled 2013-08-22: qty 5

## 2013-08-22 MED ORDER — SODIUM CHLORIDE 0.9 % IV SOLN
Freq: Once | INTRAVENOUS | Status: AC
  Administered 2013-08-22: 10:00:00 via INTRAVENOUS

## 2013-08-22 MED ORDER — ONDANSETRON 16 MG/50ML IVPB (CHCC)
16.0000 mg | Freq: Once | INTRAVENOUS | Status: AC
  Administered 2013-08-22: 16 mg via INTRAVENOUS

## 2013-08-22 NOTE — Assessment & Plan Note (Signed)
I spent some time counseling the patient the importance of tobacco cessation. he is currently attempting to quit on his own 

## 2013-08-22 NOTE — Assessment & Plan Note (Signed)
He has mild grade 1 skin toxicity from cetuximab. I recommend using over-the-counter hydrocortisone cream with Benadryl as needed. If the skin rash gets worse, we might have to use topical antibiotic cream or oral antibiotic therapy. I told the patient to avoid excessive sun exposure.

## 2013-08-22 NOTE — Assessment & Plan Note (Signed)
Overall, he tolerated treatment well. Continue treatment without dosage adjustment. I will see him again prior to cycle 4. Visibly, the size of his tumor has regressed. We'll order a PET CT scan for staging prior to his next return visit

## 2013-08-22 NOTE — Assessment & Plan Note (Signed)
This is likely due to recent treatment. The patient denies recent history of fevers, cough, chills, diarrhea or dysuria. He is asymptomatic from the leukopenia. I will observe for now.  I will continue the chemotherapy at current dose without dosage adjustment.  If the leukopenia gets progressive worse in the future, I might have to delay his treatment or adjust the chemotherapy dose. 

## 2013-08-22 NOTE — Assessment & Plan Note (Signed)
I refilled his prescription morphine sulfate. We discussed about narcotic refill policy. I am attempting to wean his pain medication requirement to maximum 4 times a day as needed.

## 2013-08-22 NOTE — Progress Notes (Signed)
Followup completed with patient.  Patient states he continues oral nutrition supplements with some soft foods.  Weight is increased and was documented as 110.4 pounds August 6 from 107.7 pounds July 23.  Patient reports no nutrition issues.  Nutrition diagnosis: inadequate oral intake has improved.  Intervention: Patient to continue soft foods with boost +6 times a day to promote weight gain.  Teach back method used.  Monitoring, evaluation, goals: Patient will tolerate adequate calories and protein for continued weight gain.  Next visit: Thursday, August 13, during chemotherapy.   **Disclaimer: This note was dictated with voice recognition software. Similar sounding words can inadvertently be transcribed and this note may contain transcription errors which may not have been corrected upon publication of note.**

## 2013-08-22 NOTE — Patient Instructions (Signed)
[facility] Discharge Instructions for Patients Receiving Chemotherapy  Today you received the following chemotherapy agents Erbitux, Carboplatin, 5-FU  To help prevent nausea and vomiting after your treatment, we encourage you to take your nausea medication     If you develop nausea and vomiting that is not controlled by your nausea medication, call the clinic.   BELOW ARE SYMPTOMS THAT SHOULD BE REPORTED IMMEDIATELY:  *FEVER GREATER THAN 100.5 F  *CHILLS WITH OR WITHOUT FEVER  NAUSEA AND VOMITING THAT IS NOT CONTROLLED WITH YOUR NAUSEA MEDICATION  *UNUSUAL SHORTNESS OF BREATH  *UNUSUAL BRUISING OR BLEEDING  TENDERNESS IN MOUTH AND THROAT WITH OR WITHOUT PRESENCE OF ULCERS  *URINARY PROBLEMS  *BOWEL PROBLEMS  UNUSUAL RASH Items with * indicate a potential emergency and should be followed up as soon as possible.  Feel free to call the clinic you have any questions or concerns. The clinic phone number is [phone].     

## 2013-08-22 NOTE — Assessment & Plan Note (Signed)
This is likely due to recent treatment. The patient denies recent history of bleeding such as epistaxis, hematuria or hematochezia. He is asymptomatic from the anemia. I will observe for now.  He does not require transfusion now. I will continue the chemotherapy at current dose without dosage adjustment.  If the anemia gets progressive worse in the future, I might have to delay his treatment or adjust the chemotherapy dose.  

## 2013-08-22 NOTE — Assessment & Plan Note (Signed)
This is likely due to recent treatment. The patient denies recent history of bleeding such as epistaxis, hematuria or hematochezia. He is asymptomatic from the low platelet count. I will observe for now.  he does not require transfusion now. I will continue the chemotherapy at current dose without dosage adjustment.  If the thrombocytopenia gets progressive worse in the future, I might have to delay his treatment or adjust the chemotherapy dose.   

## 2013-08-22 NOTE — Progress Notes (Signed)
[facility] OFFICE PROGRESS NOTE  Patient Care Team: No Pcp Per Patient as PCP - General (General Practice) Heath Lark, MD as Consulting Physician (Hematology and Oncology) Ascencion Dike, MD as Consulting Physician (Otolaryngology) Brooks Sailors, RN as Oncology Nurse Navigator (Oncology)  SUMMARY OF ONCOLOGIC HISTORY: Oncology History   Oropharyngeal cancer, HPV negative   Primary site: Pharynx - Oropharynx   Staging method: AJCC 7th Edition   Clinical: Stage IVC (T4b, N3, M1) signed by Heath Lark, MD on 07/01/2013  8:32 PM   Summary: Stage IVC (T4b, N3, M1)         Oropharyngeal cancer   05/06/2013 Imaging 4.2 x 4.7 x 6.1 cm heterogeneous right neck mass in the right level 2 & level 3 stations. It invades the carotid space, obstructing the internal jugular vein. The primary lesion may be along the inferior aspect of the right palatine tonsil    05/22/2013 Procedure Laryngoscopy revealed abnormalities beneath the right tonsil area. Unfortunately tonsillectomy and right neck biopsy came back benign tissue.   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   06/24/2013 Surgery He underwent multiple extraction of tooth numbers 2, 3, 6, 8, 9, 11, 12, 13, 14, 15, 19, 20, 22, 23, 24, 25, 26, 27, and 28 along with 4 Quadrants of alveoloplasty   06/28/2013 Imaging PET/CT scan results show widespread pulmonary metastasis.   07/11/2013 -  Chemotherapy The patient is started on palliative chemotherapy with 5-FU, carboplatin and weekly Erbitux.    INTERVAL HISTORY: Please see below for problem oriented charting. He is seen prior to cycle 3 of treatment. He tolerated loss treatment well. He denies any mucositis, nausea or diarrhea. The size of his tumor continued to get smaller. REVIEW OF SYSTEMS:   Constitutional: Denies fevers, chills or abnormal weight loss Eyes: Denies blurriness of vision Ears, nose, mouth, throat, and face: Denies mucositis or  sore throat Respiratory: Denies cough, dyspnea or wheezes Cardiovascular: Denies palpitation, chest discomfort or lower extremity swelling Gastrointestinal:  Denies nausea, heartburn or change in bowel habits Skin: Denies abnormal skin rashes Lymphatics: Denies new lymphadenopathy or easy bruising Neurological:Denies numbness, tingling or new weaknesses Behavioral/Psych: Mood is stable, no new changes  All other systems were reviewed with the patient and are negative.  I have reviewed the past medical history, past surgical history, social history and family history with the patient and they are unchanged from previous note.  ALLERGIES:  has No Known Allergies.  MEDICATIONS:  Current Outpatient Prescriptions  Medication Sig Dispense Refill  . docusate sodium (COLACE) 100 MG capsule Take 1 capsule (100 mg total) by mouth 2 (two) times daily.  60 capsule  0  . lidocaine-prilocaine (EMLA) cream Apply 1 application topically as needed.  30 g  6  . morphine (MSIR) 15 MG tablet Take 1 tablet (15 mg total) by mouth every 4 (four) hours as needed for severe pain.  90 tablet  0  . ondansetron (ZOFRAN) 8 MG tablet Take 1 tablet (8 mg total) by mouth every 8 (eight) hours as needed for nausea or vomiting.  30 tablet  1  . prochlorperazine (COMPAZINE) 10 MG tablet Take 1 tablet (10 mg total) by mouth every 6 (six) hours as needed (Nausea or vomiting).  30 tablet  1   No current facility-administered medications for this visit.    PHYSICAL EXAMINATION: ECOG PERFORMANCE STATUS: 0 - Asymptomatic  Filed Vitals:   08/22/13 0846  BP: 138/76  Pulse: 92  Temp: 97.4 F (36.3 C)  Resp: 18   Filed Weights   08/22/13 0846  Weight: 110 lb 6.4 oz (50.077 kg)    GENERAL:alert, no distress and comfortable. he looks thin and cachectic SKIN: skin color, texture, turgor are normal, no rashes or significant lesions EYES: normal, Conjunctiva are pink and non-injected, sclera clear OROPHARYNX:no exudate,  no erythema and lips, buccal mucosa, and tongue normal  NECK: Neck mass is smaller. LYMPH:  no palpable lymphadenopathy in the cervical, axillary or inguinal LUNGS: clear to auscultation and percussion with normal breathing effort HEART: regular rate & rhythm and no murmurs and no lower extremity edema ABDOMEN:abdomen soft, non-tender and normal bowel sounds Musculoskeletal:no cyanosis of digits and no clubbing  NEURO: alert & oriented x 3 with fluent speech, no focal motor/sensory deficits  LABORATORY DATA:  I have reviewed the data as listed    Component Value Date/Time   NA 137 08/22/2013 0824   K 4.4 08/22/2013 0824   CO2 30* 08/22/2013 0824   GLUCOSE 174* 08/22/2013 0824   BUN 11.7 08/22/2013 0824   CREATININE 0.7 08/22/2013 0824   CALCIUM 9.6 08/22/2013 0824   PROT 7.6 08/22/2013 0824   ALBUMIN 3.0* 08/22/2013 0824   AST 27 08/22/2013 0824   ALT 26 08/22/2013 0824   ALKPHOS 67 08/22/2013 0824   BILITOT 0.32 08/22/2013 0824    No results found for this basename: SPEP, UPEP,  kappa and lambda light chains    Lab Results  Component Value Date   WBC 3.7* 08/22/2013   NEUTROABS 2.4 08/22/2013   HGB 11.7* 08/22/2013   HCT 36.1* 08/22/2013   MCV 97.7 08/22/2013   PLT 122* 08/22/2013      Chemistry      Component Value Date/Time   NA 137 08/22/2013 0824   K 4.4 08/22/2013 0824   CO2 30* 08/22/2013 0824   BUN 11.7 08/22/2013 0824   CREATININE 0.7 08/22/2013 0824      Component Value Date/Time   CALCIUM 9.6 08/22/2013 0824   ALKPHOS 67 08/22/2013 0824   AST 27 08/22/2013 0824   ALT 26 08/22/2013 0824   BILITOT 0.32 08/22/2013 0824      ASSESSMENT & PLAN:  Oropharyngeal cancer Overall, he tolerated treatment well. Continue treatment without dosage adjustment. I will see him again prior to cycle 4. Visibly, the size of his tumor has regressed. We'll order a PET CT scan for staging prior to his next return visit      Tobacco abuse I spent some time counseling the patient the importance of tobacco  cessation. he is currently attempting to quit on his own  Throat pain I refilled his prescription morphine sulfate. We discussed about narcotic refill policy. I am attempting to wean his pain medication requirement to maximum 4 times a day as needed.      Skin rash He has mild grade 1 skin toxicity from cetuximab. I recommend using over-the-counter hydrocortisone cream with Benadryl as needed. If the skin rash gets worse, we might have to use topical antibiotic cream or oral antibiotic therapy. I told the patient to avoid excessive sun exposure.    Anemia in neoplastic disease This is likely due to recent treatment. The patient denies recent history of bleeding such as epistaxis, hematuria or hematochezia. He is asymptomatic from the anemia. I will observe for now.  He does not require transfusion now. I will continue the chemotherapy at current dose without dosage adjustment.  If the anemia gets progressive worse  in the future, I might have to delay his treatment or adjust the chemotherapy dose.  Leukopenia due to antineoplastic chemotherapy This is likely due to recent treatment. The patient denies recent history of fevers, cough, chills, diarrhea or dysuria. He is asymptomatic from the leukopenia. I will observe for now.  I will continue the chemotherapy at current dose without dosage adjustment.  If the leukopenia gets progressive worse in the future, I might have to delay his treatment or adjust the chemotherapy dose.    Thrombocytopenia due to drugs This is likely due to recent treatment. The patient denies recent history of bleeding such as epistaxis, hematuria or hematochezia. He is asymptomatic from the low platelet count. I will observe for now.  he does not require transfusion now. I will continue the chemotherapy at current dose without dosage adjustment.  If the thrombocytopenia gets progressive worse in the future, I might have to delay his treatment or adjust the  chemotherapy dose.       Orders Placed This Encounter  Procedures  . NM PET Image Restag (PS) Skull Base To Thigh    Standing Status: Future     Number of Occurrences:      Standing Expiration Date: 09/26/2014    Order Specific Question:  Reason for exam:    Answer:  staging oral cancer, assess response to Rx    Order Specific Question:  Preferred imaging location?    Answer:  [facility], [place]  . CBC with Differential    Standing Status: Future     Number of Occurrences:      Standing Expiration Date: 09/26/2014  . Comprehensive metabolic panel    Standing Status: Future     Number of Occurrences:      Standing Expiration Date: 09/26/2014   All questions were answered. The patient knows to call the clinic with any problems, questions or concerns. No barriers to learning was detected. I spent 40 minutes counseling the patient face to face. The total time spent in the appointment was 55 minutes and more than 50% was on counseling and review of test results     [facility], [place], MD 08/22/2013 8:54 PM

## 2013-08-24 ENCOUNTER — Telehealth: Payer: Self-pay | Admitting: Hematology and Oncology

## 2013-08-24 NOTE — Telephone Encounter (Signed)
Labs/ov per 08/06 POF, req to give pt sch on 08/11.Marland KitchenMarland KitchenKJ

## 2013-08-24 NOTE — Progress Notes (Signed)
Met with patient during chemo infusion.  He reported slight weight gain.  I recognized tumor shrinkage, he stated he was also encouraged by this, that per his visit with Dr. Alvy Bimler, he would getting another PET scan soon to see how effective the chemo has been.  He denied any needs at this time, I encouraged him to call me if that changed, he indicated he would.  Continuing navigation as L2 patient (treatments established).  Gayleen Orem, RN, BSN, [facility] Oncology Navigator [phone]

## 2013-08-26 ENCOUNTER — Telehealth: Payer: Self-pay | Admitting: *Deleted

## 2013-08-26 ENCOUNTER — Ambulatory Visit (HOSPITAL_BASED_OUTPATIENT_CLINIC_OR_DEPARTMENT_OTHER): Payer: BC Managed Care – PPO

## 2013-08-26 ENCOUNTER — Ambulatory Visit: Payer: BC Managed Care – PPO | Attending: Radiation Oncology

## 2013-08-26 ENCOUNTER — Ambulatory Visit: Payer: BC Managed Care – PPO

## 2013-08-26 VITALS — BP 136/88 | HR 68 | Temp 97.1°F | Resp 18

## 2013-08-26 DIAGNOSIS — R1311 Dysphagia, oral phase: Secondary | ICD-10-CM | POA: Insufficient documentation

## 2013-08-26 DIAGNOSIS — R293 Abnormal posture: Secondary | ICD-10-CM | POA: Insufficient documentation

## 2013-08-26 DIAGNOSIS — C109 Malignant neoplasm of oropharynx, unspecified: Secondary | ICD-10-CM

## 2013-08-26 DIAGNOSIS — Z5189 Encounter for other specified aftercare: Secondary | ICD-10-CM | POA: Insufficient documentation

## 2013-08-26 DIAGNOSIS — Z452 Encounter for adjustment and management of vascular access device: Secondary | ICD-10-CM

## 2013-08-26 MED ORDER — HEPARIN SOD (PORK) LOCK FLUSH 100 UNIT/ML IV SOLN
500.0000 [IU] | Freq: Once | INTRAVENOUS | Status: AC | PRN
  Administered 2013-08-26: 500 [IU]
  Filled 2013-08-26: qty 5

## 2013-08-26 MED ORDER — SODIUM CHLORIDE 0.9 % IJ SOLN
10.0000 mL | INTRAMUSCULAR | Status: DC | PRN
  Administered 2013-08-26: 10 mL
  Filled 2013-08-26: qty 10

## 2013-08-26 NOTE — Telephone Encounter (Signed)
Per staff message and POF I have scheduled appts. Advised scheduler of appts. JMW  

## 2013-08-28 ENCOUNTER — Other Ambulatory Visit: Payer: Self-pay | Admitting: Hematology and Oncology

## 2013-08-29 ENCOUNTER — Other Ambulatory Visit (HOSPITAL_BASED_OUTPATIENT_CLINIC_OR_DEPARTMENT_OTHER): Payer: BC Managed Care – PPO

## 2013-08-29 ENCOUNTER — Ambulatory Visit: Payer: BC Managed Care – PPO | Admitting: Nutrition

## 2013-08-29 ENCOUNTER — Ambulatory Visit (HOSPITAL_BASED_OUTPATIENT_CLINIC_OR_DEPARTMENT_OTHER): Payer: BC Managed Care – PPO

## 2013-08-29 VITALS — BP 130/87 | HR 91 | Temp 97.5°F | Resp 16

## 2013-08-29 DIAGNOSIS — R070 Pain in throat: Secondary | ICD-10-CM

## 2013-08-29 DIAGNOSIS — Z5112 Encounter for antineoplastic immunotherapy: Secondary | ICD-10-CM

## 2013-08-29 DIAGNOSIS — C109 Malignant neoplasm of oropharynx, unspecified: Secondary | ICD-10-CM

## 2013-08-29 LAB — MAGNESIUM (CC13): MAGNESIUM: 2.1 mg/dL (ref 1.5–2.5)

## 2013-08-29 MED ORDER — SODIUM CHLORIDE 0.9 % IJ SOLN
10.0000 mL | INTRAMUSCULAR | Status: DC | PRN
  Administered 2013-08-29: 10 mL
  Filled 2013-08-29: qty 10

## 2013-08-29 MED ORDER — SODIUM CHLORIDE 0.9 % IV SOLN
Freq: Once | INTRAVENOUS | Status: AC
  Administered 2013-08-29: 10:00:00 via INTRAVENOUS

## 2013-08-29 MED ORDER — HEPARIN SOD (PORK) LOCK FLUSH 100 UNIT/ML IV SOLN
500.0000 [IU] | Freq: Once | INTRAVENOUS | Status: AC | PRN
  Administered 2013-08-29: 500 [IU]
  Filled 2013-08-29: qty 5

## 2013-08-29 MED ORDER — DIPHENHYDRAMINE HCL 50 MG/ML IJ SOLN
INTRAMUSCULAR | Status: AC
  Filled 2013-08-29: qty 1

## 2013-08-29 MED ORDER — DIPHENHYDRAMINE HCL 50 MG/ML IJ SOLN
50.0000 mg | Freq: Once | INTRAMUSCULAR | Status: AC
  Administered 2013-08-29: 50 mg via INTRAVENOUS

## 2013-08-29 MED ORDER — CETUXIMAB CHEMO IV INJECTION 200 MG/100ML
250.0000 mg/m2 | Freq: Once | INTRAVENOUS | Status: AC
  Administered 2013-08-29: 400 mg via INTRAVENOUS
  Filled 2013-08-29: qty 200

## 2013-08-29 NOTE — Progress Notes (Signed)
Followed up with pt during chemo.  Pt reports he is still consuming 6 cans of Boost plus/day along with some soft foods.  Wt last week 111# per pt, no weight available for this week.  Wt stable x 30 days.  No new problems stated.  Nutrition Dx: Inadequate oral intake improved.   Intervention:  Encouraged continued intake of Boost 6x/day and soft foods as tolerated to help promote wt gain and healing.  Provided coupons for Boost plus.   Monitoring, evaluation, goals:  Pt will continue to tolerate supplements and po intake to promote wt gain.   Next visit:  To be scheduled during next chemo.

## 2013-08-29 NOTE — Patient Instructions (Signed)
[facility] Discharge Instructions for Patients Receiving Chemotherapy  Today you received the following chemotherapy agents Erbitux  To help prevent nausea and vomiting after your treatment, we encourage you to take your nausea medication as prescribed.   If you develop nausea and vomiting that is not controlled by your nausea medication, call the clinic.   BELOW ARE SYMPTOMS THAT SHOULD BE REPORTED IMMEDIATELY:  *FEVER GREATER THAN 100.5 F  *CHILLS WITH OR WITHOUT FEVER  NAUSEA AND VOMITING THAT IS NOT CONTROLLED WITH YOUR NAUSEA MEDICATION  *UNUSUAL SHORTNESS OF BREATH  *UNUSUAL BRUISING OR BLEEDING  TENDERNESS IN MOUTH AND THROAT WITH OR WITHOUT PRESENCE OF ULCERS  *URINARY PROBLEMS  *BOWEL PROBLEMS  UNUSUAL RASH Items with * indicate a potential emergency and should be followed up as soon as possible.  Feel free to call the clinic you have any questions or concerns. The clinic phone number is [phone].    

## 2013-09-05 ENCOUNTER — Other Ambulatory Visit: Payer: Self-pay | Admitting: Hematology and Oncology

## 2013-09-05 ENCOUNTER — Other Ambulatory Visit (HOSPITAL_BASED_OUTPATIENT_CLINIC_OR_DEPARTMENT_OTHER): Payer: BC Managed Care – PPO

## 2013-09-05 ENCOUNTER — Ambulatory Visit (HOSPITAL_BASED_OUTPATIENT_CLINIC_OR_DEPARTMENT_OTHER): Payer: BC Managed Care – PPO

## 2013-09-05 VITALS — BP 127/74 | HR 81 | Temp 97.9°F | Resp 16

## 2013-09-05 DIAGNOSIS — R070 Pain in throat: Secondary | ICD-10-CM

## 2013-09-05 DIAGNOSIS — C109 Malignant neoplasm of oropharynx, unspecified: Secondary | ICD-10-CM

## 2013-09-05 DIAGNOSIS — Z5112 Encounter for antineoplastic immunotherapy: Secondary | ICD-10-CM

## 2013-09-05 LAB — COMPREHENSIVE METABOLIC PANEL (CC13)
ALT: 24 U/L (ref 0–55)
ANION GAP: 10 meq/L (ref 3–11)
AST: 28 U/L (ref 5–34)
Albumin: 3.1 g/dL — ABNORMAL LOW (ref 3.5–5.0)
Alkaline Phosphatase: 70 U/L (ref 40–150)
BUN: 11.5 mg/dL (ref 7.0–26.0)
CALCIUM: 9.6 mg/dL (ref 8.4–10.4)
CO2: 29 mEq/L (ref 22–29)
CREATININE: 0.7 mg/dL (ref 0.7–1.3)
Chloride: 99 mEq/L (ref 98–109)
GLUCOSE: 149 mg/dL — AB (ref 70–140)
Potassium: 3.9 mEq/L (ref 3.5–5.1)
Sodium: 138 mEq/L (ref 136–145)
TOTAL PROTEIN: 7.6 g/dL (ref 6.4–8.3)
Total Bilirubin: 0.33 mg/dL (ref 0.20–1.20)

## 2013-09-05 LAB — CBC WITH DIFFERENTIAL/PLATELET
BASO%: 0.6 % (ref 0.0–2.0)
Basophils Absolute: 0 10*3/uL (ref 0.0–0.1)
EOS%: 0.3 % (ref 0.0–7.0)
Eosinophils Absolute: 0 10*3/uL (ref 0.0–0.5)
HCT: 31.8 % — ABNORMAL LOW (ref 38.4–49.9)
HEMOGLOBIN: 10.3 g/dL — AB (ref 13.0–17.1)
LYMPH#: 1 10*3/uL (ref 0.9–3.3)
LYMPH%: 32.6 % (ref 14.0–49.0)
MCH: 31.7 pg (ref 27.2–33.4)
MCHC: 32.3 g/dL (ref 32.0–36.0)
MCV: 98.2 fL — ABNORMAL HIGH (ref 79.3–98.0)
MONO#: 0.5 10*3/uL (ref 0.1–0.9)
MONO%: 15.5 % — ABNORMAL HIGH (ref 0.0–14.0)
NEUT#: 1.6 10*3/uL (ref 1.5–6.5)
NEUT%: 51 % (ref 39.0–75.0)
Platelets: 113 10*3/uL — ABNORMAL LOW (ref 140–400)
RBC: 3.24 10*6/uL — ABNORMAL LOW (ref 4.20–5.82)
RDW: 15.3 % — ABNORMAL HIGH (ref 11.0–14.6)
WBC: 3.1 10*3/uL — ABNORMAL LOW (ref 4.0–10.3)

## 2013-09-05 LAB — MAGNESIUM (CC13): MAGNESIUM: 2 mg/dL (ref 1.5–2.5)

## 2013-09-05 MED ORDER — DIPHENHYDRAMINE HCL 50 MG/ML IJ SOLN
50.0000 mg | Freq: Once | INTRAMUSCULAR | Status: AC
  Administered 2013-09-05: 50 mg via INTRAVENOUS

## 2013-09-05 MED ORDER — SODIUM CHLORIDE 0.9 % IJ SOLN
10.0000 mL | INTRAMUSCULAR | Status: DC | PRN
Start: 2013-09-05 — End: 2013-09-05
  Administered 2013-09-05: 10 mL
  Filled 2013-09-05: qty 10

## 2013-09-05 MED ORDER — CETUXIMAB CHEMO IV INJECTION 200 MG/100ML
250.0000 mg/m2 | Freq: Once | INTRAVENOUS | Status: AC
  Administered 2013-09-05: 400 mg via INTRAVENOUS
  Filled 2013-09-05: qty 200

## 2013-09-05 MED ORDER — HEPARIN SOD (PORK) LOCK FLUSH 100 UNIT/ML IV SOLN
500.0000 [IU] | Freq: Once | INTRAVENOUS | Status: AC | PRN
  Administered 2013-09-05: 500 [IU]
  Filled 2013-09-05: qty 5

## 2013-09-05 MED ORDER — SODIUM CHLORIDE 0.9 % IV SOLN
Freq: Once | INTRAVENOUS | Status: AC
  Administered 2013-09-05: 12:00:00 via INTRAVENOUS

## 2013-09-05 MED ORDER — DIPHENHYDRAMINE HCL 50 MG/ML IJ SOLN
INTRAMUSCULAR | Status: AC
  Filled 2013-09-05: qty 1

## 2013-09-05 NOTE — Patient Instructions (Signed)
[facility] Discharge Instructions for Patients Receiving Chemotherapy  Today you received the following chemotherapy agents Erbitux  To help prevent nausea and vomiting after your treatment, we encourage you to take your nausea medication as prescribed.   If you develop nausea and vomiting that is not controlled by your nausea medication, call the clinic.   BELOW ARE SYMPTOMS THAT SHOULD BE REPORTED IMMEDIATELY:  *FEVER GREATER THAN 100.5 F  *CHILLS WITH OR WITHOUT FEVER  NAUSEA AND VOMITING THAT IS NOT CONTROLLED WITH YOUR NAUSEA MEDICATION  *UNUSUAL SHORTNESS OF BREATH  *UNUSUAL BRUISING OR BLEEDING  TENDERNESS IN MOUTH AND THROAT WITH OR WITHOUT PRESENCE OF ULCERS  *URINARY PROBLEMS  *BOWEL PROBLEMS  UNUSUAL RASH Items with * indicate a potential emergency and should be followed up as soon as possible.  Feel free to call the clinic you have any questions or concerns. The clinic phone number is [phone].    

## 2013-09-09 ENCOUNTER — Ambulatory Visit (HOSPITAL_COMMUNITY)
Admission: RE | Admit: 2013-09-09 | Discharge: 2013-09-09 | Disposition: A | Discharge status: Home / Self Care | Payer: BC Managed Care – PPO | Source: Ambulatory Visit | Attending: Hematology and Oncology | Admitting: Hematology and Oncology

## 2013-09-09 ENCOUNTER — Encounter (HOSPITAL_COMMUNITY): Payer: Self-pay

## 2013-09-09 DIAGNOSIS — D3500 Benign neoplasm of unspecified adrenal gland: Secondary | ICD-10-CM | POA: Diagnosis not present

## 2013-09-09 DIAGNOSIS — C109 Malignant neoplasm of oropharynx, unspecified: Secondary | ICD-10-CM | POA: Diagnosis present

## 2013-09-09 DIAGNOSIS — R599 Enlarged lymph nodes, unspecified: Secondary | ICD-10-CM | POA: Insufficient documentation

## 2013-09-09 DIAGNOSIS — C7800 Secondary malignant neoplasm of unspecified lung: Secondary | ICD-10-CM | POA: Diagnosis not present

## 2013-09-09 DIAGNOSIS — R070 Pain in throat: Secondary | ICD-10-CM

## 2013-09-09 DIAGNOSIS — R220 Localized swelling, mass and lump, head: Secondary | ICD-10-CM | POA: Diagnosis not present

## 2013-09-09 DIAGNOSIS — R221 Localized swelling, mass and lump, neck: Secondary | ICD-10-CM

## 2013-09-09 LAB — GLUCOSE, CAPILLARY: Glucose-Capillary: 130 mg/dL — ABNORMAL HIGH (ref 70–99)

## 2013-09-09 MED ORDER — FLUDEOXYGLUCOSE F - 18 (FDG) INJECTION: 5.5000 | Freq: Once | INTRAVENOUS | Status: AC | PRN

## 2013-09-12 ENCOUNTER — Other Ambulatory Visit (HOSPITAL_BASED_OUTPATIENT_CLINIC_OR_DEPARTMENT_OTHER): Payer: BC Managed Care – PPO

## 2013-09-12 ENCOUNTER — Encounter: Payer: Self-pay | Admitting: *Deleted

## 2013-09-12 ENCOUNTER — Ambulatory Visit (HOSPITAL_BASED_OUTPATIENT_CLINIC_OR_DEPARTMENT_OTHER): Payer: BC Managed Care – PPO

## 2013-09-12 ENCOUNTER — Ambulatory Visit (HOSPITAL_BASED_OUTPATIENT_CLINIC_OR_DEPARTMENT_OTHER): Payer: BC Managed Care – PPO | Admitting: Hematology and Oncology

## 2013-09-12 ENCOUNTER — Encounter: Payer: Self-pay | Admitting: Nutrition

## 2013-09-12 ENCOUNTER — Encounter: Payer: Self-pay | Admitting: Hematology and Oncology

## 2013-09-12 VITALS — BP 148/73 | HR 93 | Temp 97.7°F | Resp 18 | Ht 73.0 in | Wt 108.4 lb

## 2013-09-12 DIAGNOSIS — C109 Malignant neoplasm of oropharynx, unspecified: Secondary | ICD-10-CM

## 2013-09-12 DIAGNOSIS — R070 Pain in throat: Secondary | ICD-10-CM

## 2013-09-12 DIAGNOSIS — Z5111 Encounter for antineoplastic chemotherapy: Secondary | ICD-10-CM

## 2013-09-12 DIAGNOSIS — D701 Agranulocytosis secondary to cancer chemotherapy: Secondary | ICD-10-CM

## 2013-09-12 DIAGNOSIS — T451X5A Adverse effect of antineoplastic and immunosuppressive drugs, initial encounter: Secondary | ICD-10-CM

## 2013-09-12 DIAGNOSIS — R21 Rash and other nonspecific skin eruption: Secondary | ICD-10-CM

## 2013-09-12 DIAGNOSIS — D630 Anemia in neoplastic disease: Secondary | ICD-10-CM

## 2013-09-12 DIAGNOSIS — R634 Abnormal weight loss: Secondary | ICD-10-CM

## 2013-09-12 DIAGNOSIS — D6959 Other secondary thrombocytopenia: Secondary | ICD-10-CM

## 2013-09-12 DIAGNOSIS — T50905A Adverse effect of unspecified drugs, medicaments and biological substances, initial encounter: Secondary | ICD-10-CM

## 2013-09-12 DIAGNOSIS — Z5112 Encounter for antineoplastic immunotherapy: Secondary | ICD-10-CM

## 2013-09-12 DIAGNOSIS — Z95828 Presence of other vascular implants and grafts: Secondary | ICD-10-CM

## 2013-09-12 LAB — COMPREHENSIVE METABOLIC PANEL (CC13)
ALK PHOS: 69 U/L (ref 40–150)
ALT: 25 U/L (ref 0–55)
AST: 29 U/L (ref 5–34)
Albumin: 2.9 g/dL — ABNORMAL LOW (ref 3.5–5.0)
Anion Gap: 12 mEq/L — ABNORMAL HIGH (ref 3–11)
BILIRUBIN TOTAL: 0.3 mg/dL (ref 0.20–1.20)
BUN: 9.2 mg/dL (ref 7.0–26.0)
CO2: 26 mEq/L (ref 22–29)
Calcium: 9 mg/dL (ref 8.4–10.4)
Chloride: 98 mEq/L (ref 98–109)
Creatinine: 0.6 mg/dL — ABNORMAL LOW (ref 0.7–1.3)
Glucose: 179 mg/dl — ABNORMAL HIGH (ref 70–140)
Potassium: 4.3 mEq/L (ref 3.5–5.1)
SODIUM: 136 meq/L (ref 136–145)
TOTAL PROTEIN: 7.7 g/dL (ref 6.4–8.3)

## 2013-09-12 LAB — CBC WITH DIFFERENTIAL/PLATELET
BASO%: 0.8 % (ref 0.0–2.0)
Basophils Absolute: 0.1 10*3/uL (ref 0.0–0.1)
EOS%: 0.9 % (ref 0.0–7.0)
Eosinophils Absolute: 0.1 10*3/uL (ref 0.0–0.5)
HCT: 30.5 % — ABNORMAL LOW (ref 38.4–49.9)
HGB: 10.1 g/dL — ABNORMAL LOW (ref 13.0–17.1)
LYMPH%: 15.7 % (ref 14.0–49.0)
MCH: 32.6 pg (ref 27.2–33.4)
MCHC: 33.1 g/dL (ref 32.0–36.0)
MCV: 98.7 fL — AB (ref 79.3–98.0)
MONO#: 0.8 10*3/uL (ref 0.1–0.9)
MONO%: 12.7 % (ref 0.0–14.0)
NEUT#: 4.2 10*3/uL (ref 1.5–6.5)
NEUT%: 69.9 % (ref 39.0–75.0)
PLATELETS: 116 10*3/uL — AB (ref 140–400)
RBC: 3.09 10*6/uL — AB (ref 4.20–5.82)
RDW: 16.8 % — ABNORMAL HIGH (ref 11.0–14.6)
WBC: 6.1 10*3/uL (ref 4.0–10.3)
lymph#: 1 10*3/uL (ref 0.9–3.3)

## 2013-09-12 LAB — MAGNESIUM (CC13): Magnesium: 2.2 mg/dl (ref 1.5–2.5)

## 2013-09-12 MED ORDER — MORPHINE SULFATE 15 MG PO TABS: 15.0000 mg | ORAL_TABLET | ORAL | Status: DC | PRN

## 2013-09-12 MED ORDER — DEXAMETHASONE SODIUM PHOSPHATE 20 MG/5ML IJ SOLN
INTRAMUSCULAR | Status: AC
  Filled 2013-09-12: qty 5

## 2013-09-12 MED ORDER — SODIUM CHLORIDE 0.9 % IJ SOLN
10.0000 mL | INTRAMUSCULAR | Status: DC | PRN
  Filled 2013-09-12: qty 10

## 2013-09-12 MED ORDER — CETUXIMAB CHEMO IV INJECTION 200 MG/100ML
250.0000 mg/m2 | Freq: Once | INTRAVENOUS | Status: AC
  Administered 2013-09-12: 400 mg via INTRAVENOUS
  Filled 2013-09-12: qty 200

## 2013-09-12 MED ORDER — DEXAMETHASONE SODIUM PHOSPHATE 20 MG/5ML IJ SOLN
20.0000 mg | Freq: Once | INTRAMUSCULAR | Status: AC
  Administered 2013-09-12: 20 mg via INTRAVENOUS

## 2013-09-12 MED ORDER — SODIUM CHLORIDE 0.9 % IV SOLN
Freq: Once | INTRAVENOUS | Status: AC
  Administered 2013-09-12: 09:00:00 via INTRAVENOUS

## 2013-09-12 MED ORDER — HEPARIN SOD (PORK) LOCK FLUSH 100 UNIT/ML IV SOLN
500.0000 [IU] | Freq: Once | INTRAVENOUS | Status: DC | PRN
  Filled 2013-09-12: qty 5

## 2013-09-12 MED ORDER — DIPHENHYDRAMINE HCL 50 MG/ML IJ SOLN
INTRAMUSCULAR | Status: AC
  Filled 2013-09-12: qty 1

## 2013-09-12 MED ORDER — ONDANSETRON 16 MG/50ML IVPB (CHCC)
16.0000 mg | Freq: Once | INTRAVENOUS | Status: AC
  Administered 2013-09-12: 16 mg via INTRAVENOUS

## 2013-09-12 MED ORDER — SODIUM CHLORIDE 0.9 % IV SOLN
1000.0000 mg/m2/d | INTRAVENOUS | Status: DC
  Administered 2013-09-12: 6650 mg via INTRAVENOUS
  Filled 2013-09-12: qty 133

## 2013-09-12 MED ORDER — DIPHENHYDRAMINE HCL 50 MG/ML IJ SOLN
50.0000 mg | Freq: Once | INTRAMUSCULAR | Status: AC
  Administered 2013-09-12: 50 mg via INTRAVENOUS

## 2013-09-12 MED ORDER — SODIUM CHLORIDE 0.9 % IV SOLN
508.5000 mg | Freq: Once | INTRAVENOUS | Status: AC
  Administered 2013-09-12: 510 mg via INTRAVENOUS
  Filled 2013-09-12: qty 51

## 2013-09-12 MED ORDER — SODIUM CHLORIDE 0.9 % IJ SOLN
10.0000 mL | INTRAMUSCULAR | Status: DC | PRN
  Administered 2013-09-12: 10 mL via INTRAVENOUS
  Filled 2013-09-12: qty 10

## 2013-09-12 MED ORDER — ONDANSETRON 16 MG/50ML IVPB (CHCC)
INTRAVENOUS | Status: AC
  Filled 2013-09-12: qty 16

## 2013-09-12 NOTE — Assessment & Plan Note (Signed)
This is likely due to recent treatment. The patient denies recent history of fevers, cough, chills, diarrhea or dysuria. He is asymptomatic from the leukopenia. I will observe for now.  I will continue the chemotherapy at current dose without dosage adjustment.  If the leukopenia gets progressive worse in the future, I might have to delay his treatment or adjust the chemotherapy dose. 

## 2013-09-12 NOTE — Progress Notes (Signed)
To provide support, encouragement and care continuity, met with patient during his appt with Dr. Alvy Bimler.  He reported: 1.  Continues to work full time. 2.  Not smoking. 3.  Denies nausea. 4.  Taking PRN pain med 3-4/d.   5.  Oral intake limited b/c edentulous.   6.  Because of expense, has reduced Boost PEG instillation from 6 to 5 cans/day.  He has nutritional appt with [employer] today, I encouraged him to ask her for availability of samples. He denied any needs at this time, I reminded him to call me if that changes.  Gayleen Orem, RN, BSN, [facility] [phone]

## 2013-09-12 NOTE — Progress Notes (Signed)
Do not wait for CMET or magnesium results for treatment per Dr. Alvy Bimler  Patient observed for 1 hour post Erbitux infusion, no s/sx of reaction or other complications verbalized or noticed. Patient discharged home ambulatory.

## 2013-09-12 NOTE — Assessment & Plan Note (Signed)
He lost 2 more pounds of weight. I recommend he increase oral supplement intake. He denies dysphagia.

## 2013-09-12 NOTE — Assessment & Plan Note (Signed)
He has mild grade 1 skin toxicity from cetuximab. I recommend using over-the-counter hydrocortisone cream with Benadryl as needed. If the skin rash gets worse, we might have to use topical antibiotic cream or oral antibiotic therapy. I told the patient to avoid excessive sun exposure.

## 2013-09-12 NOTE — Patient Instructions (Signed)
[facility] Discharge Instructions for Patients Receiving Chemotherapy  Today you received the following chemotherapy agents Erbitux/Carboplatin/5FU.  To help prevent nausea and vomiting after your treatment, we encourage you to take your nausea medication as prescribed.   If you develop nausea and vomiting that is not controlled by your nausea medication, call the clinic.   BELOW ARE SYMPTOMS THAT SHOULD BE REPORTED IMMEDIATELY:  *FEVER GREATER THAN 100.5 F  *CHILLS WITH OR WITHOUT FEVER  NAUSEA AND VOMITING THAT IS NOT CONTROLLED WITH YOUR NAUSEA MEDICATION  *UNUSUAL SHORTNESS OF BREATH  *UNUSUAL BRUISING OR BLEEDING  TENDERNESS IN MOUTH AND THROAT WITH OR WITHOUT PRESENCE OF ULCERS  *URINARY PROBLEMS  *BOWEL PROBLEMS  UNUSUAL RASH Items with * indicate a potential emergency and should be followed up as soon as possible.  Feel free to call the clinic you have any questions or concerns. The clinic phone number is [phone].    

## 2013-09-12 NOTE — Patient Instructions (Signed)

## 2013-09-12 NOTE — Assessment & Plan Note (Signed)
I refilled his prescription morphine sulfate. We discussed about narcotic refill policy. I am attempting to wean his pain medication requirement to maximum 4 times a day as needed.

## 2013-09-12 NOTE — Assessment & Plan Note (Signed)
This is likely due to recent treatment. The patient denies recent history of bleeding such as epistaxis, hematuria or hematochezia. He is asymptomatic from the low platelet count. I will observe for now.  he does not require transfusion now. I will continue the chemotherapy at current dose without dosage adjustment.  If the thrombocytopenia gets progressive worse in the future, I might have to delay his treatment or adjust the chemotherapy dose.   

## 2013-09-12 NOTE — Assessment & Plan Note (Signed)
He responded well to treatment. I will continue the same dose without dosage adjustment. I plan to give him total 6 cycles of chemotherapy and then restage again with PET/CT scan. The patient would be a candidate for maintenance Erbitux in the future after he completed 6 cycles of chemotherapy.

## 2013-09-12 NOTE — Progress Notes (Signed)
[facility] OFFICE PROGRESS NOTE  Patient Care Team: No Pcp Per Patient as PCP - General (General Practice) Heath Lark, MD as Consulting Physician (Hematology and Oncology) Ascencion Dike, MD as Consulting Physician (Otolaryngology) Brooks Sailors, RN as Oncology Nurse Navigator (Oncology)  SUMMARY OF ONCOLOGIC HISTORY: Oncology History   Oropharyngeal cancer, HPV negative   Primary site: Pharynx - Oropharynx   Staging method: AJCC 7th Edition   Clinical: Stage IVC (T4b, N3, M1) signed by Heath Lark, MD on 07/01/2013  8:32 PM   Summary: Stage IVC (T4b, N3, M1)         Oropharyngeal cancer   05/06/2013 Imaging 4.2 x 4.7 x 6.1 cm heterogeneous right neck mass in the right level 2 & level 3 stations. It invades the carotid space, obstructing the internal jugular vein. The primary lesion may be along the inferior aspect of the right palatine tonsil    05/22/2013 Procedure Laryngoscopy revealed abnormalities beneath the right tonsil area. Unfortunately tonsillectomy and right neck biopsy came back benign tissue.   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   06/24/2013 Surgery He underwent multiple extraction of tooth numbers 2, 3, 6, 8, 9, 11, 12, 13, 14, 15, 19, 20, 22, 23, 24, 25, 26, 27, and 28 along with 4 Quadrants of alveoloplasty   06/28/2013 Imaging PET/CT scan results show widespread pulmonary metastasis.   07/11/2013 -  Chemotherapy The patient is started on palliative chemotherapy with 5-FU, carboplatin and weekly Erbitux.   09/09/2013 Imaging Repeat PET CT scan show good response to treatment.    INTERVAL HISTORY: Please see below for problem oriented charting. He is seen prior to cycle 4 of chemotherapy. He has lost 2 pounds of weight due to reduced oral intake. He has of persistent throat pain, controlled with morphine. He has used hydrocortisone cream for skin rash. REVIEW OF SYSTEMS:   Constitutional: Denies fevers, chills   Eyes: Denies blurriness of vision Respiratory: Denies cough, dyspnea or wheezes Cardiovascular: Denies palpitation, chest discomfort or lower extremity swelling Lymphatics: Denies new lymphadenopathy or easy bruising Neurological:Denies numbness, tingling or new weaknesses Behavioral/Psych: Mood is stable, no new changes  All other systems were reviewed with the patient and are negative.  I have reviewed the past medical history, past surgical history, social history and family history with the patient and they are unchanged from previous note.  ALLERGIES:  has No Known Allergies.  MEDICATIONS:  Current Outpatient Prescriptions  Medication Sig Dispense Refill  . docusate sodium (COLACE) 100 MG capsule Take 1 capsule (100 mg total) by mouth 2 (two) times daily.  60 capsule  0  . lidocaine-prilocaine (EMLA) cream Apply 1 application topically as needed.  30 g  6  . morphine (MSIR) 15 MG tablet Take 1 tablet (15 mg total) by mouth every 4 (four) hours as needed for severe pain.  90 tablet  0  . ondansetron (ZOFRAN) 8 MG tablet Take 1 tablet (8 mg total) by mouth every 8 (eight) hours as needed for nausea or vomiting.  30 tablet  1  . prochlorperazine (COMPAZINE) 10 MG tablet Take 1 tablet (10 mg total) by mouth every 6 (six) hours as needed (Nausea or vomiting).  30 tablet  1   No current facility-administered medications for this visit.    PHYSICAL EXAMINATION: ECOG PERFORMANCE STATUS: 1 - Symptomatic but completely ambulatory  Filed Vitals:   09/12/13 0849  BP: 148/73  Pulse: 93  Temp: 97.7 F (36.5  C)  Resp: 18   Filed Weights   09/12/13 0849  Weight: 108 lb 6.4 oz (49.17 kg)    GENERAL:alert, no distress and comfortable. He looks thin and cachectic SKIN: Mild grade 1 skin toxicity on his face only. EYES: normal, Conjunctiva are pink and non-injected, sclera clear OROPHARYNX:no exudate, no erythema and lips, buccal mucosa, and tongue normal  NECK: Persistent neck mass,  reduced in size. LYMPH:  no palpable lymphadenopathy in the cervical, axillary or inguinal LUNGS: clear to auscultation and percussion with normal breathing effort HEART: regular rate & rhythm and no murmurs and no lower extremity edema ABDOMEN:abdomen soft, non-tender and normal bowel sounds Musculoskeletal:no cyanosis of digits and no clubbing  NEURO: alert & oriented x 3 with fluent speech, no focal motor/sensory deficits  LABORATORY DATA:  I have reviewed the data as listed    Component Value Date/Time   NA 138 09/05/2013 1129   K 3.9 09/05/2013 1129   CO2 29 09/05/2013 1129   GLUCOSE 149* 09/05/2013 1129   BUN 11.5 09/05/2013 1129   CREATININE 0.7 09/05/2013 1129   CALCIUM 9.6 09/05/2013 1129   PROT 7.6 09/05/2013 1129   ALBUMIN 3.1* 09/05/2013 1129   AST 28 09/05/2013 1129   ALT 24 09/05/2013 1129   ALKPHOS 70 09/05/2013 1129   BILITOT 0.33 09/05/2013 1129    No results found for this basename: SPEP, UPEP,  kappa and lambda light chains    Lab Results  Component Value Date   WBC 6.1 09/12/2013   NEUTROABS 4.2 09/12/2013   HGB 10.1* 09/12/2013   HCT 30.5* 09/12/2013   MCV 98.7* 09/12/2013   PLT 116* 09/12/2013      Chemistry      Component Value Date/Time   NA 138 09/05/2013 1129   K 3.9 09/05/2013 1129   CO2 29 09/05/2013 1129   BUN 11.5 09/05/2013 1129   CREATININE 0.7 09/05/2013 1129      Component Value Date/Time   CALCIUM 9.6 09/05/2013 1129   ALKPHOS 70 09/05/2013 1129   AST 28 09/05/2013 1129   ALT 24 09/05/2013 1129   BILITOT 0.33 09/05/2013 1129       RADIOGRAPHIC STUDIES: I reviewed the most recent PET/CT scan with him. I have personally reviewed the radiological images as listed and agreed with the findings in the report.   ASSESSMENT & PLAN:  Oropharyngeal cancer He responded well to treatment. I will continue the same dose without dosage adjustment. I plan to give him total 6 cycles of chemotherapy and then restage again with PET/CT scan. The patient would be a  candidate for maintenance Erbitux in the future after he completed 6 cycles of chemotherapy.  Weight loss He lost 2 more pounds of weight. I recommend he increase oral supplement intake. He denies dysphagia.      Thrombocytopenia due to drugs This is likely due to recent treatment. The patient denies recent history of bleeding such as epistaxis, hematuria or hematochezia. He is asymptomatic from the low platelet count. I will observe for now.  he does not require transfusion now. I will continue the chemotherapy at current dose without dosage adjustment.  If the thrombocytopenia gets progressive worse in the future, I might have to delay his treatment or adjust the chemotherapy dose.      Throat pain I refilled his prescription morphine sulfate. We discussed about narcotic refill policy. I am attempting to wean his pain medication requirement to maximum 4 times a day as needed.  Leukopenia due to antineoplastic chemotherapy This is likely due to recent treatment. The patient denies recent history of fevers, cough, chills, diarrhea or dysuria. He is asymptomatic from the leukopenia. I will observe for now.  I will continue the chemotherapy at current dose without dosage adjustment.  If the leukopenia gets progressive worse in the future, I might have to delay his treatment or adjust the chemotherapy dose.      Anemia in neoplastic disease This is likely due to recent treatment. The patient denies recent history of bleeding such as epistaxis, hematuria or hematochezia. He is asymptomatic from the anemia. I will observe for now.  He does not require transfusion now. I will continue the chemotherapy at current dose without dosage adjustment.  If the anemia gets progressive worse in the future, I might have to delay his treatment or adjust the chemotherapy dose.    Skin rash He has mild grade 1 skin toxicity from cetuximab. I recommend using over-the-counter hydrocortisone  cream with Benadryl as needed. If the skin rash gets worse, we might have to use topical antibiotic cream or oral antibiotic therapy. I told the patient to avoid excessive sun exposure.       Orders Placed This Encounter  Procedures  . CBC with Differential    Standing Status: Future     Number of Occurrences: 1     Standing Expiration Date: 09/12/2014  . Comprehensive metabolic panel (Cmet) - [facility]    Standing Status: Future     Number of Occurrences: 1     Standing Expiration Date: 09/12/2014   All questions were answered. The patient knows to call the clinic with any problems, questions or concerns. No barriers to learning was detected. I spent 30 minutes counseling the patient face to face. The total time spent in the appointment was 40 minutes and more than 50% was on counseling and review of test results     [facility], [place], MD 09/12/2013 9:10 AM

## 2013-09-12 NOTE — Assessment & Plan Note (Signed)
This is likely due to recent treatment. The patient denies recent history of bleeding such as epistaxis, hematuria or hematochezia. He is asymptomatic from the anemia. I will observe for now.  He does not require transfusion now. I will continue the chemotherapy at current dose without dosage adjustment.  If the anemia gets progressive worse in the future, I might have to delay his treatment or adjust the chemotherapy dose.  

## 2013-09-13 ENCOUNTER — Telehealth: Payer: Self-pay | Admitting: Hematology and Oncology

## 2013-09-13 ENCOUNTER — Telehealth: Payer: Self-pay | Admitting: *Deleted

## 2013-09-13 NOTE — Telephone Encounter (Signed)
Per staff message and POF I have scheduled appts. Advised scheduler of appts. JMW  

## 2013-09-13 NOTE — Telephone Encounter (Signed)
, °

## 2013-09-16 ENCOUNTER — Ambulatory Visit (HOSPITAL_BASED_OUTPATIENT_CLINIC_OR_DEPARTMENT_OTHER): Payer: BC Managed Care – PPO

## 2013-09-16 VITALS — BP 120/76 | HR 96

## 2013-09-16 DIAGNOSIS — Z452 Encounter for adjustment and management of vascular access device: Secondary | ICD-10-CM

## 2013-09-16 DIAGNOSIS — C109 Malignant neoplasm of oropharynx, unspecified: Secondary | ICD-10-CM

## 2013-09-16 DIAGNOSIS — Z95828 Presence of other vascular implants and grafts: Secondary | ICD-10-CM

## 2013-09-16 MED ORDER — SODIUM CHLORIDE 0.9 % IJ SOLN
10.0000 mL | INTRAMUSCULAR | Status: DC | PRN
  Filled 2013-09-16: qty 10

## 2013-09-16 MED ORDER — HEPARIN SOD (PORK) LOCK FLUSH 100 UNIT/ML IV SOLN
500.0000 [IU] | Freq: Once | INTRAVENOUS | Status: DC
  Filled 2013-09-16: qty 5

## 2013-09-16 MED ORDER — HEPARIN SOD (PORK) LOCK FLUSH 100 UNIT/ML IV SOLN
500.0000 [IU] | Freq: Once | INTRAVENOUS | Status: DC | PRN
  Filled 2013-09-16: qty 5

## 2013-09-16 MED ORDER — SODIUM CHLORIDE 0.9 % IJ SOLN
10.0000 mL | INTRAMUSCULAR | Status: DC | PRN
  Administered 2013-09-16: 10 mL via INTRAVENOUS
  Filled 2013-09-16: qty 10

## 2013-09-16 NOTE — Patient Instructions (Signed)

## 2013-09-19 ENCOUNTER — Ambulatory Visit: Payer: BC Managed Care – PPO | Admitting: Nutrition

## 2013-09-19 ENCOUNTER — Ambulatory Visit (HOSPITAL_BASED_OUTPATIENT_CLINIC_OR_DEPARTMENT_OTHER): Payer: BC Managed Care – PPO

## 2013-09-19 VITALS — BP 119/86 | HR 107 | Temp 97.1°F

## 2013-09-19 DIAGNOSIS — Z5112 Encounter for antineoplastic immunotherapy: Secondary | ICD-10-CM

## 2013-09-19 DIAGNOSIS — C109 Malignant neoplasm of oropharynx, unspecified: Secondary | ICD-10-CM

## 2013-09-19 MED ORDER — DIPHENHYDRAMINE HCL 50 MG/ML IJ SOLN
50.0000 mg | Freq: Once | INTRAMUSCULAR | Status: AC
  Administered 2013-09-19: 50 mg via INTRAVENOUS

## 2013-09-19 MED ORDER — SODIUM CHLORIDE 0.9 % IJ SOLN
10.0000 mL | INTRAMUSCULAR | Status: DC | PRN
  Administered 2013-09-19: 10 mL
  Filled 2013-09-19: qty 10

## 2013-09-19 MED ORDER — CETUXIMAB CHEMO IV INJECTION 200 MG/100ML
250.0000 mg/m2 | Freq: Once | INTRAVENOUS | Status: AC
  Administered 2013-09-19: 400 mg via INTRAVENOUS
  Filled 2013-09-19: qty 200

## 2013-09-19 MED ORDER — DIPHENHYDRAMINE HCL 50 MG/ML IJ SOLN
INTRAMUSCULAR | Status: AC
  Filled 2013-09-19: qty 1

## 2013-09-19 MED ORDER — HEPARIN SOD (PORK) LOCK FLUSH 100 UNIT/ML IV SOLN
500.0000 [IU] | Freq: Once | INTRAVENOUS | Status: AC | PRN
  Administered 2013-09-19: 500 [IU]
  Filled 2013-09-19: qty 5

## 2013-09-19 MED ORDER — SODIUM CHLORIDE 0.9 % IV SOLN
Freq: Once | INTRAVENOUS | Status: AC
  Administered 2013-09-19: 10:00:00 via INTRAVENOUS

## 2013-09-19 NOTE — Progress Notes (Signed)
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  Weight decreased and documented as 108.4 pounds August 27, down from 111 pounds.  Patient reports he continues to try to blenderize foods.  He has decreased oral nutrition supplements from 6 boost plus down to 5 boost plus daily.  Patient does not have a feeding tube.  Patient states the reason he did this was financial.  Patient has no other nutrition complaints.  Nutrition diagnosis: Inadequate oral intake continues.  Intervention:  Educated patient on importance of increasing pured foods to make up additional calories from decreasing boost plus or increasing boost plus back to 6 bottles daily.   Provided additional samples for patient to take today as well as coupons.   Education provided on strategies for increasing calories.   Also educated patient on preparing milkshakes using boost plus.   Questions were answered.  Teach back method used.  Monitoring, evaluation, goals: Patient will increase oral intake along with boost plus to 6 daily to minimize further weight loss.  Next visit: Thursday, September 10 during chemotherapy.  **Disclaimer: This note was dictated with voice recognition software. Similar sounding words can inadvertently be transcribed and this note may contain transcription errors which may not have been corrected upon publication of note.**

## 2013-09-19 NOTE — Patient Instructions (Signed)
[facility] Discharge Instructions for Patients Receiving Chemotherapy  Today you received the following chemotherapy agents Erbitux  To help prevent nausea and vomiting after your treatment, we encourage you to take your nausea medication as prescribed.   If you develop nausea and vomiting that is not controlled by your nausea medication, call the clinic.   BELOW ARE SYMPTOMS THAT SHOULD BE REPORTED IMMEDIATELY:  *FEVER GREATER THAN 100.5 F  *CHILLS WITH OR WITHOUT FEVER  NAUSEA AND VOMITING THAT IS NOT CONTROLLED WITH YOUR NAUSEA MEDICATION  *UNUSUAL SHORTNESS OF BREATH  *UNUSUAL BRUISING OR BLEEDING  TENDERNESS IN MOUTH AND THROAT WITH OR WITHOUT PRESENCE OF ULCERS  *URINARY PROBLEMS  *BOWEL PROBLEMS  UNUSUAL RASH Items with * indicate a potential emergency and should be followed up as soon as possible.  Feel free to call the clinic you have any questions or concerns. The clinic phone number is [phone].    

## 2013-09-26 ENCOUNTER — Ambulatory Visit: Payer: BC Managed Care – PPO | Admitting: Nutrition

## 2013-09-26 ENCOUNTER — Ambulatory Visit (HOSPITAL_BASED_OUTPATIENT_CLINIC_OR_DEPARTMENT_OTHER): Payer: BC Managed Care – PPO

## 2013-09-26 VITALS — BP 123/67 | HR 85 | Temp 98.0°F | Resp 18

## 2013-09-26 DIAGNOSIS — C109 Malignant neoplasm of oropharynx, unspecified: Secondary | ICD-10-CM

## 2013-09-26 DIAGNOSIS — Z5112 Encounter for antineoplastic immunotherapy: Secondary | ICD-10-CM

## 2013-09-26 MED ORDER — CETUXIMAB CHEMO IV INJECTION 200 MG/100ML
250.0000 mg/m2 | Freq: Once | INTRAVENOUS | Status: AC
  Administered 2013-09-26: 400 mg via INTRAVENOUS
  Filled 2013-09-26: qty 200

## 2013-09-26 MED ORDER — HEPARIN SOD (PORK) LOCK FLUSH 100 UNIT/ML IV SOLN
500.0000 [IU] | Freq: Once | INTRAVENOUS | Status: AC | PRN
Start: 2013-09-26 — End: 2013-09-26
  Administered 2013-09-26: 500 [IU]
  Filled 2013-09-26: qty 5

## 2013-09-26 MED ORDER — SODIUM CHLORIDE 0.9 % IJ SOLN
10.0000 mL | INTRAMUSCULAR | Status: DC | PRN
  Administered 2013-09-26: 10 mL
  Filled 2013-09-26: qty 10

## 2013-09-26 MED ORDER — SODIUM CHLORIDE 0.9 % IV SOLN
Freq: Once | INTRAVENOUS | Status: AC
  Administered 2013-09-26: 09:00:00 via INTRAVENOUS

## 2013-09-26 MED ORDER — DIPHENHYDRAMINE HCL 50 MG/ML IJ SOLN
INTRAMUSCULAR | Status: AC
  Filled 2013-09-26: qty 1

## 2013-09-26 MED ORDER — DIPHENHYDRAMINE HCL 50 MG/ML IJ SOLN
50.0000 mg | Freq: Once | INTRAMUSCULAR | Status: AC
  Administered 2013-09-26: 50 mg via INTRAVENOUS

## 2013-09-26 NOTE — Patient Instructions (Signed)
[facility] Discharge Instructions for Patients Receiving Chemotherapy  Today you received the following chemotherapy agents Erbitux  To help prevent nausea and vomiting after your treatment, we encourage you to take your nausea medication as prescribed.   If you develop nausea and vomiting that is not controlled by your nausea medication, call the clinic.   BELOW ARE SYMPTOMS THAT SHOULD BE REPORTED IMMEDIATELY:  *FEVER GREATER THAN 100.5 F  *CHILLS WITH OR WITHOUT FEVER  NAUSEA AND VOMITING THAT IS NOT CONTROLLED WITH YOUR NAUSEA MEDICATION  *UNUSUAL SHORTNESS OF BREATH  *UNUSUAL BRUISING OR BLEEDING  TENDERNESS IN MOUTH AND THROAT WITH OR WITHOUT PRESENCE OF ULCERS  *URINARY PROBLEMS  *BOWEL PROBLEMS  UNUSUAL RASH Items with * indicate a potential emergency and should be followed up as soon as possible.  Feel free to call the clinic you have any questions or concerns. The clinic phone number is [phone].    

## 2013-09-26 NOTE — Progress Notes (Signed)
Patient reports he feels well.  He states he just bought boost on sale and was able to utilize coupons I provided.  There is no new weight to evaluate weight change.  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  He is drinking 6 boost plus daily and occasionally will blenderize foods.  He has no nutrition complaints.  Nutrition diagnosis:  Inadequate oral intake improved per patient.  Intervention: Recommended patient continue boost +6 times daily along with pured foods to promote weight gain.  Monitoring, evaluation, goals: Patient will tolerate adequate calories and protein to minimize weight loss.  Next visit: Thursday, September 17, during chemotherapy.  **Disclaimer: This note was dictated with voice recognition software. Similar sounding words can inadvertently be transcribed and this note may contain transcription errors which may not have been corrected upon publication of note.**

## 2013-09-30 ENCOUNTER — Ambulatory Visit: Payer: BC Managed Care – PPO | Attending: Radiation Oncology

## 2013-09-30 DIAGNOSIS — R293 Abnormal posture: Secondary | ICD-10-CM | POA: Insufficient documentation

## 2013-09-30 DIAGNOSIS — R1311 Dysphagia, oral phase: Secondary | ICD-10-CM | POA: Insufficient documentation

## 2013-09-30 DIAGNOSIS — Z5189 Encounter for other specified aftercare: Secondary | ICD-10-CM | POA: Insufficient documentation

## 2013-10-03 ENCOUNTER — Ambulatory Visit: Payer: BC Managed Care – PPO | Admitting: Nutrition

## 2013-10-03 ENCOUNTER — Encounter: Payer: Self-pay | Admitting: Hematology and Oncology

## 2013-10-03 ENCOUNTER — Ambulatory Visit (HOSPITAL_BASED_OUTPATIENT_CLINIC_OR_DEPARTMENT_OTHER): Payer: BC Managed Care – PPO | Admitting: Hematology and Oncology

## 2013-10-03 ENCOUNTER — Other Ambulatory Visit: Payer: Self-pay | Admitting: *Deleted

## 2013-10-03 ENCOUNTER — Encounter: Payer: Self-pay | Admitting: *Deleted

## 2013-10-03 ENCOUNTER — Telehealth: Payer: Self-pay | Admitting: Hematology and Oncology

## 2013-10-03 ENCOUNTER — Other Ambulatory Visit (HOSPITAL_BASED_OUTPATIENT_CLINIC_OR_DEPARTMENT_OTHER): Payer: BC Managed Care – PPO

## 2013-10-03 ENCOUNTER — Ambulatory Visit (HOSPITAL_BASED_OUTPATIENT_CLINIC_OR_DEPARTMENT_OTHER): Payer: BC Managed Care – PPO

## 2013-10-03 VITALS — BP 155/89 | HR 86 | Temp 98.1°F | Resp 17 | Ht 73.0 in | Wt 107.7 lb

## 2013-10-03 DIAGNOSIS — R634 Abnormal weight loss: Secondary | ICD-10-CM

## 2013-10-03 DIAGNOSIS — Z23 Encounter for immunization: Secondary | ICD-10-CM

## 2013-10-03 DIAGNOSIS — D701 Agranulocytosis secondary to cancer chemotherapy: Secondary | ICD-10-CM

## 2013-10-03 DIAGNOSIS — C109 Malignant neoplasm of oropharynx, unspecified: Secondary | ICD-10-CM

## 2013-10-03 DIAGNOSIS — R070 Pain in throat: Secondary | ICD-10-CM

## 2013-10-03 DIAGNOSIS — T451X5A Adverse effect of antineoplastic and immunosuppressive drugs, initial encounter: Secondary | ICD-10-CM

## 2013-10-03 DIAGNOSIS — D630 Anemia in neoplastic disease: Secondary | ICD-10-CM

## 2013-10-03 DIAGNOSIS — Z5112 Encounter for antineoplastic immunotherapy: Secondary | ICD-10-CM

## 2013-10-03 DIAGNOSIS — Z720 Tobacco use: Secondary | ICD-10-CM

## 2013-10-03 DIAGNOSIS — D6959 Other secondary thrombocytopenia: Secondary | ICD-10-CM

## 2013-10-03 DIAGNOSIS — T50905A Adverse effect of unspecified drugs, medicaments and biological substances, initial encounter: Secondary | ICD-10-CM

## 2013-10-03 DIAGNOSIS — C139 Malignant neoplasm of hypopharynx, unspecified: Secondary | ICD-10-CM

## 2013-10-03 DIAGNOSIS — Z5111 Encounter for antineoplastic chemotherapy: Secondary | ICD-10-CM

## 2013-10-03 DIAGNOSIS — D72819 Decreased white blood cell count, unspecified: Secondary | ICD-10-CM

## 2013-10-03 DIAGNOSIS — R21 Rash and other nonspecific skin eruption: Secondary | ICD-10-CM

## 2013-10-03 LAB — CBC WITH DIFFERENTIAL/PLATELET
BASO%: 0.3 % (ref 0.0–2.0)
BASOS ABS: 0 10*3/uL (ref 0.0–0.1)
EOS%: 1.3 % (ref 0.0–7.0)
Eosinophils Absolute: 0.1 10*3/uL (ref 0.0–0.5)
HCT: 31.6 % — ABNORMAL LOW (ref 38.4–49.9)
HEMOGLOBIN: 10.3 g/dL — AB (ref 13.0–17.1)
LYMPH#: 1.3 10*3/uL (ref 0.9–3.3)
LYMPH%: 34.9 % (ref 14.0–49.0)
MCH: 33.4 pg (ref 27.2–33.4)
MCHC: 32.6 g/dL (ref 32.0–36.0)
MCV: 102.6 fL — AB (ref 79.3–98.0)
MONO#: 0.6 10*3/uL (ref 0.1–0.9)
MONO%: 16.1 % — AB (ref 0.0–14.0)
NEUT%: 47.4 % (ref 39.0–75.0)
NEUTROS ABS: 1.8 10*3/uL (ref 1.5–6.5)
Platelets: 102 10*3/uL — ABNORMAL LOW (ref 140–400)
RBC: 3.08 10*6/uL — ABNORMAL LOW (ref 4.20–5.82)
RDW: 22.7 % — AB (ref 11.0–14.6)
WBC: 3.8 10*3/uL — ABNORMAL LOW (ref 4.0–10.3)
nRBC: 0 % (ref 0–0)

## 2013-10-03 LAB — COMPREHENSIVE METABOLIC PANEL (CC13)
ALBUMIN: 3.3 g/dL — AB (ref 3.5–5.0)
ALT: 16 U/L (ref 0–55)
AST: 27 U/L (ref 5–34)
Alkaline Phosphatase: 71 U/L (ref 40–150)
Anion Gap: 13 mEq/L — ABNORMAL HIGH (ref 3–11)
BUN: 12.3 mg/dL (ref 7.0–26.0)
CALCIUM: 9.5 mg/dL (ref 8.4–10.4)
CHLORIDE: 102 meq/L (ref 98–109)
CO2: 23 mEq/L (ref 22–29)
CREATININE: 0.7 mg/dL (ref 0.7–1.3)
Glucose: 138 mg/dl (ref 70–140)
POTASSIUM: 4 meq/L (ref 3.5–5.1)
Sodium: 138 mEq/L (ref 136–145)
Total Bilirubin: 0.32 mg/dL (ref 0.20–1.20)
Total Protein: 8.2 g/dL (ref 6.4–8.3)

## 2013-10-03 LAB — MAGNESIUM (CC13): Magnesium: 2.2 mg/dl (ref 1.5–2.5)

## 2013-10-03 MED ORDER — DEXAMETHASONE SODIUM PHOSPHATE 20 MG/5ML IJ SOLN
INTRAMUSCULAR | Status: AC
  Filled 2013-10-03: qty 5

## 2013-10-03 MED ORDER — ONDANSETRON 16 MG/50ML IVPB (CHCC)
INTRAVENOUS | Status: AC
  Filled 2013-10-03: qty 16

## 2013-10-03 MED ORDER — INFLUENZA VAC SPLIT QUAD 0.5 ML IM SUSY: 0.5000 mL | PREFILLED_SYRINGE | INTRAMUSCULAR | Status: DC

## 2013-10-03 MED ORDER — CETUXIMAB CHEMO IV INJECTION 200 MG/100ML
250.0000 mg/m2 | Freq: Once | INTRAVENOUS | Status: AC
  Administered 2013-10-03: 400 mg via INTRAVENOUS
  Filled 2013-10-03: qty 200

## 2013-10-03 MED ORDER — MORPHINE SULFATE 15 MG PO TABS: 15.0000 mg | ORAL_TABLET | ORAL | Status: DC | PRN

## 2013-10-03 MED ORDER — DIPHENHYDRAMINE HCL 50 MG/ML IJ SOLN
50.0000 mg | Freq: Once | INTRAMUSCULAR | Status: AC
  Administered 2013-10-03: 50 mg via INTRAVENOUS

## 2013-10-03 MED ORDER — SODIUM CHLORIDE 0.9 % IV SOLN
Freq: Once | INTRAVENOUS | Status: AC
  Administered 2013-10-03: 10:00:00 via INTRAVENOUS

## 2013-10-03 MED ORDER — ONDANSETRON 16 MG/50ML IVPB (CHCC)
16.0000 mg | Freq: Once | INTRAVENOUS | Status: AC
  Administered 2013-10-03: 16 mg via INTRAVENOUS

## 2013-10-03 MED ORDER — DIPHENHYDRAMINE HCL 50 MG/ML IJ SOLN
INTRAMUSCULAR | Status: AC
  Filled 2013-10-03: qty 1

## 2013-10-03 MED ORDER — SODIUM CHLORIDE 0.9 % IV SOLN
1000.0000 mg/m2/d | INTRAVENOUS | Status: DC
  Administered 2013-10-03: 6650 mg via INTRAVENOUS
  Filled 2013-10-03: qty 133

## 2013-10-03 MED ORDER — SODIUM CHLORIDE 0.9 % IV SOLN
510.0000 mg | Freq: Once | INTRAVENOUS | Status: AC
  Administered 2013-10-03: 510 mg via INTRAVENOUS
  Filled 2013-10-03: qty 51

## 2013-10-03 MED ORDER — INFLUENZA VAC SPLIT QUAD 0.5 ML IM SUSY
0.5000 mL | PREFILLED_SYRINGE | Freq: Once | INTRAMUSCULAR | Status: AC
  Administered 2013-10-03: 0.5 mL via INTRAMUSCULAR
  Filled 2013-10-03: qty 0.5

## 2013-10-03 MED ORDER — DEXAMETHASONE SODIUM PHOSPHATE 20 MG/5ML IJ SOLN
20.0000 mg | Freq: Once | INTRAMUSCULAR | Status: AC
  Administered 2013-10-03: 20 mg via INTRAVENOUS

## 2013-10-03 NOTE — Assessment & Plan Note (Addendum)
I refilled his prescription morphine sulfate. We discussed about narcotic refill policy. I am attempting to wean his pain medication requirement to maximum 3 times a day as needed.

## 2013-10-03 NOTE — Assessment & Plan Note (Signed)
He responded well to treatment. I will continue the same dose without dosage adjustment. I plan to give him total 6 cycles of chemotherapy and then restage again with PET/CT scan. The patient would be a candidate for maintenance Erbitux in the future after he completed 6 cycles of chemotherapy.

## 2013-10-03 NOTE — Progress Notes (Signed)
Patient reports he has been unable to afford oral nutrition supplements.  Weight continues to decline and was documented as 107.7 pounds September 17.  Patient states he is trying to blenderize some foods.  He has no other nutrition complaints.  Nutrition diagnosis: Inadequate oral intake continues.  Intervention: Recommended patient increase boost plus times 6 times a day along with pured foods to promote weight gain.  Provided patient with resources to purchase boost plus.  Patient has adequate coupons.  Monitoring, evaluation, goals: Patient will increase oral intake to promote weight gain.  Next visit: Not scheduled at this time.  Will work with patient as needed.   **Disclaimer: This note was dictated with voice recognition software. Similar sounding words can inadvertently be transcribed and this note may contain transcription errors which may not have been corrected upon publication of note.**

## 2013-10-03 NOTE — Assessment & Plan Note (Signed)
I spent some time counseling the patient the importance of tobacco cessation. he is currently attempting to quit on his own 

## 2013-10-03 NOTE — Assessment & Plan Note (Signed)
This is likely due to recent treatment. The patient denies recent history of fevers, cough, chills, diarrhea or dysuria. He is asymptomatic from the leukopenia. I will observe for now.  I will continue the chemotherapy at current dose without dosage adjustment.  If the leukopenia gets progressive worse in the future, I might have to delay his treatment or adjust the chemotherapy dose. 

## 2013-10-03 NOTE — Telephone Encounter (Signed)
lvm pt a vm with updated appts...advised pt to come in monday and get new print out

## 2013-10-03 NOTE — Assessment & Plan Note (Signed)
He has mild grade 1 skin toxicity from cetuximab. I recommend using over-the-counter hydrocortisone cream with Benadryl as needed. If the skin rash gets worse, we might have to use topical antibiotic cream or oral antibiotic therapy. I told the patient to avoid excessive sun exposure.

## 2013-10-03 NOTE — Assessment & Plan Note (Signed)
This is likely due to recent treatment. The patient denies recent history of bleeding such as epistaxis, hematuria or hematochezia. He is asymptomatic from the anemia. I will observe for now.  He does not require transfusion now. I will continue the chemotherapy at current dose without dosage adjustment.  If the anemia gets progressive worse in the future, I might have to delay his treatment or adjust the chemotherapy dose.  

## 2013-10-03 NOTE — Assessment & Plan Note (Signed)
This is likely due to recent treatment. The patient denies recent history of bleeding such as epistaxis, hematuria or hematochezia. He is asymptomatic from the low platelet count. I will observe for now.  he does not require transfusion now. I will continue the chemotherapy at current dose without dosage adjustment.  If the thrombocytopenia gets progressive worse in the future, I might have to delay his treatment or adjust the chemotherapy dose.   

## 2013-10-03 NOTE — Assessment & Plan Note (Addendum)
He lost 2 more pounds of weight. I recommend he increase oral supplement intake. He denies dysphagia. I have requested the dietitian to see him.

## 2013-10-03 NOTE — Patient Instructions (Signed)
[facility] Discharge Instructions for Patients Receiving Chemotherapy  Today you received the following chemotherapy agents Erbitux/Carboplatin/5FU.  To help prevent nausea and vomiting after your treatment, we encourage you to take your nausea medication as prescribed.   If you develop nausea and vomiting that is not controlled by your nausea medication, call the clinic.   BELOW ARE SYMPTOMS THAT SHOULD BE REPORTED IMMEDIATELY:  *FEVER GREATER THAN 100.5 F  *CHILLS WITH OR WITHOUT FEVER  NAUSEA AND VOMITING THAT IS NOT CONTROLLED WITH YOUR NAUSEA MEDICATION  *UNUSUAL SHORTNESS OF BREATH  *UNUSUAL BRUISING OR BLEEDING  TENDERNESS IN MOUTH AND THROAT WITH OR WITHOUT PRESENCE OF ULCERS  *URINARY PROBLEMS  *BOWEL PROBLEMS  UNUSUAL RASH Items with * indicate a potential emergency and should be followed up as soon as possible.  Feel free to call the clinic you have any questions or concerns. The clinic phone number is [phone].    

## 2013-10-03 NOTE — Progress Notes (Signed)
[facility] OFFICE PROGRESS NOTE  Patient Care Team: No Pcp Per Patient as PCP - General (General Practice) Heath Lark, MD as Consulting Physician (Hematology and Oncology) Ascencion Dike, MD as Consulting Physician (Otolaryngology) Brooks Sailors, RN as Oncology Nurse Navigator (Oncology)  SUMMARY OF ONCOLOGIC HISTORY: Oncology History   Oropharyngeal cancer, HPV negative   Primary site: Pharynx - Oropharynx   Staging method: AJCC 7th Edition   Clinical: Stage IVC (T4b, N3, M1) signed by Heath Lark, MD on 07/01/2013  8:32 PM   Summary: Stage IVC (T4b, N3, M1)         Oropharyngeal cancer   05/06/2013 Imaging 4.2 x 4.7 x 6.1 cm heterogeneous right neck mass in the right level 2 & level 3 stations. It invades the carotid space, obstructing the internal jugular vein. The primary lesion may be along the inferior aspect of the right palatine tonsil    05/22/2013 Procedure Laryngoscopy revealed abnormalities beneath the right tonsil area. Unfortunately tonsillectomy and right neck biopsy came back benign tissue.   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   06/24/2013 Surgery He underwent multiple extraction of tooth numbers 2, 3, 6, 8, 9, 11, 12, 13, 14, 15, 19, 20, 22, 23, 24, 25, 26, 27, and 28 along with 4 Quadrants of alveoloplasty   06/28/2013 Imaging PET/CT scan results show widespread pulmonary metastasis.   07/11/2013 -  Chemotherapy The patient is started on palliative chemotherapy with 5-FU, carboplatin and weekly Erbitux.   09/09/2013 Imaging Repeat PET CT scan show good response to treatment.    INTERVAL HISTORY: Please see below for problem oriented charting. He is seen prior to cycle 5 of treatment. He still complaining of neck pain.  REVIEW OF SYSTEMS:   Constitutional: Denies fevers, chills or abnormal weight loss Eyes: Denies blurriness of vision Ears, nose, mouth, throat, and face: Denies mucositis or sore  throat Respiratory: Denies cough, dyspnea or wheezes Cardiovascular: Denies palpitation, chest discomfort or lower extremity swelling Gastrointestinal:  Denies nausea, heartburn or change in bowel habits Skin: Denies abnormal skin rashes Lymphatics: Denies new lymphadenopathy or easy bruising Neurological:Denies numbness, tingling or new weaknesses Behavioral/Psych: Mood is stable, no new changes  All other systems were reviewed with the patient and are negative.  I have reviewed the past medical history, past surgical history, social history and family history with the patient and they are unchanged from previous note.  ALLERGIES:  has No Known Allergies.  MEDICATIONS:  Current Outpatient Prescriptions  Medication Sig Dispense Refill  . docusate sodium (COLACE) 100 MG capsule Take 1 capsule (100 mg total) by mouth 2 (two) times daily.  60 capsule  0  . lidocaine-prilocaine (EMLA) cream Apply 1 application topically as needed.  30 g  6  . morphine (MSIR) 15 MG tablet Take 1 tablet (15 mg total) by mouth every 4 (four) hours as needed for severe pain.  60 tablet  0  . ondansetron (ZOFRAN) 8 MG tablet Take 1 tablet (8 mg total) by mouth every 8 (eight) hours as needed for nausea or vomiting.  30 tablet  1  . prochlorperazine (COMPAZINE) 10 MG tablet Take 1 tablet (10 mg total) by mouth every 6 (six) hours as needed (Nausea or vomiting).  30 tablet  1   No current facility-administered medications for this visit.   Facility-Administered Medications Ordered in Other Visits  Medication Dose Route Frequency Provider Last Rate Last Dose  . CARBOplatin (PARAPLATIN) 510 mg in  sodium chloride 0.9 % 250 mL chemo infusion  510 mg Intravenous Once Heath Lark, MD      . cetuximab (ERBITUX) chemo infusion 400 mg  250 mg/m2 (Treatment Plan Actual) Intravenous Once Heath Lark, MD 200 mL/hr at 10/03/13 1058 400 mg at 10/03/13 1058  . fluorouracil (ADRUCIL) 6,650 mg in sodium chloride 0.9 % 150 mL chemo  infusion  1,000 mg/m2/day (Treatment Plan Actual) Intravenous 4 days Heath Lark, MD        PHYSICAL EXAMINATION: ECOG PERFORMANCE STATUS: 1 - Symptomatic but completely ambulatory  Filed Vitals:   10/03/13 0906  BP: 155/89  Pulse: 86  Temp: 98.1 F (36.7 C)  Resp: 17   Filed Weights   10/03/13 0906  Weight: 107 lb 11.2 oz (48.852 kg)    GENERAL:alert, no distress and comfortable. He looks thin and cachectic SKIN: Grade 1 skin toxicity is noted from Erbitux EYES: normal, Conjunctiva are pink and non-injected, sclera clear OROPHARYNX:no exudate, no erythema and lips, buccal mucosa, and tongue normal  NECK: Persistent lump in the right side of his neck, slightly improved compared to prior exam LYMPH:  no palpable lymphadenopathy in the cervical, axillary or inguinal LUNGS: clear to auscultation and percussion with normal breathing effort HEART: regular rate & rhythm and no murmurs and no lower extremity edema ABDOMEN:abdomen soft, non-tender and normal bowel sounds Musculoskeletal:no cyanosis of digits and no clubbing  NEURO: alert & oriented x 3 with fluent speech, no focal motor/sensory deficits  LABORATORY DATA:  I have reviewed the data as listed    Component Value Date/Time   NA 138 10/03/2013 0936   K 4.0 10/03/2013 0936   CO2 23 10/03/2013 0936   GLUCOSE 138 10/03/2013 0936   BUN 12.3 10/03/2013 0936   CREATININE 0.7 10/03/2013 0936   CALCIUM 9.5 10/03/2013 0936   PROT 8.2 10/03/2013 0936   ALBUMIN 3.3* 10/03/2013 0936   AST 27 10/03/2013 0936   ALT 16 10/03/2013 0936   ALKPHOS 71 10/03/2013 0936   BILITOT 0.32 10/03/2013 0936    No results found for this basename: SPEP, UPEP,  kappa and lambda light chains    Lab Results  Component Value Date   WBC 3.8* 10/03/2013   NEUTROABS 1.8 10/03/2013   HGB 10.3* 10/03/2013   HCT 31.6* 10/03/2013   MCV 102.6* 10/03/2013   PLT 102* 10/03/2013      Chemistry      Component Value Date/Time   NA 138 10/03/2013 0936   K 4.0  10/03/2013 0936   CO2 23 10/03/2013 0936   BUN 12.3 10/03/2013 0936   CREATININE 0.7 10/03/2013 0936      Component Value Date/Time   CALCIUM 9.5 10/03/2013 0936   ALKPHOS 71 10/03/2013 0936   AST 27 10/03/2013 0936   ALT 16 10/03/2013 0936   BILITOT 0.32 10/03/2013 0936      ASSESSMENT & PLAN:  Oropharyngeal cancer He responded well to treatment. I will continue the same dose without dosage adjustment. I plan to give him total 6 cycles of chemotherapy and then restage again with PET/CT scan. The patient would be a candidate for maintenance Erbitux in the future after he completed 6 cycles of chemotherapy.  Weight loss He lost 2 more pounds of weight. I recommend he increase oral supplement intake. He denies dysphagia. I have requested the dietitian to see him.    Tobacco abuse I spent some time counseling the patient the importance of tobacco cessation. he is currently attempting to quit  on his own  Throat pain I refilled his prescription morphine sulfate. We discussed about narcotic refill policy. I am attempting to wean his pain medication requirement to maximum 3 times a day as needed.  Skin rash He has mild grade 1 skin toxicity from cetuximab. I recommend using over-the-counter hydrocortisone cream with Benadryl as needed. If the skin rash gets worse, we might have to use topical antibiotic cream or oral antibiotic therapy. I told the patient to avoid excessive sun exposure.  Anemia in neoplastic disease This is likely due to recent treatment. The patient denies recent history of bleeding such as epistaxis, hematuria or hematochezia. He is asymptomatic from the anemia. I will observe for now.  He does not require transfusion now. I will continue the chemotherapy at current dose without dosage adjustment.  If the anemia gets progressive worse in the future, I might have to delay his treatment or adjust the chemotherapy dose.  Leukopenia due to antineoplastic chemotherapy This  is likely due to recent treatment. The patient denies recent history of fevers, cough, chills, diarrhea or dysuria. He is asymptomatic from the leukopenia. I will observe for now.  I will continue the chemotherapy at current dose without dosage adjustment.  If the leukopenia gets progressive worse in the future, I might have to delay his treatment or adjust the chemotherapy dose.  Thrombocytopenia due to drugs This is likely due to recent treatment. The patient denies recent history of bleeding such as epistaxis, hematuria or hematochezia. He is asymptomatic from the low platelet count. I will observe for now.  he does not require transfusion now. I will continue the chemotherapy at current dose without dosage adjustment.  If the thrombocytopenia gets progressive worse in the future, I might have to delay his treatment or adjust the chemotherapy dose.   Orders Placed This Encounter  Procedures  . CBC with Differential    Standing Status: Standing     Number of Occurrences: 9     Standing Expiration Date: 10/04/2014  . Comprehensive metabolic panel    Standing Status: Standing     Number of Occurrences: 9     Standing Expiration Date: 10/04/2014   All questions were answered. The patient knows to call the clinic with any problems, questions or concerns. No barriers to learning was detected. I spent 30 minutes counseling the patient face to face. The total time spent in the appointment was 40 minutes and more than 50% was on counseling and review of test results     [facility], [place], MD 10/03/2013 11:08 AM

## 2013-10-04 NOTE — Progress Notes (Signed)
To provide support and encouragement, care continuity and to assess for needs, met with patient during his chemo infusion.  He did not express any needs or concerns at this time, I encouraged him to contact me if that changes before I see him next, he verbalized agreement.  Gayleen Orem, RN, BSN, [facility] [phone]

## 2013-10-07 ENCOUNTER — Ambulatory Visit (HOSPITAL_BASED_OUTPATIENT_CLINIC_OR_DEPARTMENT_OTHER): Payer: BC Managed Care – PPO

## 2013-10-07 VITALS — BP 117/67 | HR 93

## 2013-10-07 DIAGNOSIS — C109 Malignant neoplasm of oropharynx, unspecified: Secondary | ICD-10-CM

## 2013-10-07 MED ORDER — SODIUM CHLORIDE 0.9 % IJ SOLN
10.0000 mL | INTRAMUSCULAR | Status: DC | PRN
  Administered 2013-10-07: 10 mL
  Filled 2013-10-07: qty 10

## 2013-10-07 MED ORDER — HEPARIN SOD (PORK) LOCK FLUSH 100 UNIT/ML IV SOLN
500.0000 [IU] | Freq: Once | INTRAVENOUS | Status: AC | PRN
  Administered 2013-10-07: 500 [IU]
  Filled 2013-10-07: qty 5

## 2013-10-07 NOTE — Patient Instructions (Signed)
Fluorouracil, 5FU; Diclofenac topical cream What is this medicine? FLUOROURACIL; DICLOFENAC (flure oh YOOR a sil; dye KLOE fen ak) is a combination of a topical chemotherapy agent and non-steroidal anti-inflammatory drug (NSAID). It is used on the skin to treat skin cancer and skin conditions that could become cancer. This medicine may be used for other purposes; ask your health care provider or pharmacist if you have questions. COMMON BRAND NAME(S): FLUORAC What should I tell my health care provider before I take this medicine? They need to know if you have any of these conditions: -bleeding problems -cigarette smoker -DPD enzyme deficiency -heart disease -high blood pressure -if you frequently drink alcohol containing drinks -kidney disease -liver disease -open or infected skin -stomach problems -swelling or open sores at the treatment site -recent or planned coronary artery bypass graft (CABG) surgery -an unusual or allergic reaction to fluorouracil, diclofenac, aspirin, other NSAIDs, other medicines, foods, dyes, or preservatives -pregnant or trying to get pregnant -breast-feeding How should I use this medicine? This medicine is only for use on the skin. Follow the directions on the prescription label. Wash hands before and after use. Wash affected area and gently pat dry. To apply this medicine use a cotton-tipped applicator, or use gloves if applying with fingertips. If applied with unprotected fingertips, it is very important to wash your hands well after you apply this medicine. Avoid applying to the eyes, nose, or mouth. Apply enough medicine to cover the affected area. You can cover the area with a light gauze dressing, but do not use tight or air-tight dressings. Finish the full course prescribed by your doctor or health care professional, even if you think your condition is better. Do not stop taking except on the advice of your doctor or health care professional. Talk to your  pediatrician regarding the use of this medicine in children. Special care may be needed. Overdosage: If you think you've taken too much of this medicine contact a poison control center or emergency room at once. Overdosage: If you think you have taken too much of this medicine contact a poison control center or emergency room at once. NOTE: This medicine is only for you. Do not share this medicine with others. What if I miss a dose? If you miss a dose, apply it as soon as you can. If it is almost time for your next dose, only use that dose. Do not apply extra doses. Contact your doctor or health care professional if you miss more than one dose. What may interact with this medicine? Interactions are not expected. Do not use any other skin products without telling your doctor or health care professional. This list may not describe all possible interactions. Give your health care provider a list of all the medicines, herbs, non-prescription drugs, or dietary supplements you use. Also tell them if you smoke, drink alcohol, or use illegal drugs. Some items may interact with your medicine. What should I watch for while using this medicine? Visit your doctor or health care professional for checks on your progress. You will need to use this medicine for 2 to 6 weeks. This may be longer depending on the condition being treated. You may not see full healing for another 1 to 2 months after you stop using the medicine. Treated areas of skin can look unsightly during and for several weeks after treatment with this medicine. This medicine can make you more sensitive to the sun. Keep out of the sun. If you cannot avoid being in   the sun, wear protective clothing and use sunscreen. Do not use sun lamps or tanning beds/booths. Where should I keep my What side effects may I notice from receiving this medicine? Side effects that you should report to your doctor or health care professional as soon as possible: -allergic  reactions like skin rash, itching or hives, swelling of the face, lips, or tongue -black or bloody stools, blood in the urine or vomit -blurred vision -chest pain -difficulty breathing or wheezing -redness, blistering, peeling or loosening of the skin, including inside the mouth -severe redness and swelling of normal skin -slurred speech or weakness on one side of the body -trouble passing urine or change in the amount of urine -unexplained weight gain or swelling -unusually weak or tired -yellowing of eyes or skin Side effects that usually do not require medical attention (Report these to your doctor or health care professional if they continue or are bothersome.): -increased sensitivity of the skin to sun and ultraviolet light -pain and burning of the affected area -scaling or swelling of the affected area -skin rash, itching of the affected area -tenderness This list may not describe all possible side effects. Call your doctor for medical advice about side effects. You may report side effects to FDA at 1-800-FDA-1088. Where should I keep my medicine? Keep out of the reach of children. Store at room temperature between 20 and 25 degrees C (68 and 77 degrees F). Throw away any unused medicine after the expiration date. NOTE: This sheet is a summary. It may not cover all possible information. If you have questions about this medicine, talk to your doctor, pharmacist, or health care provider.  2015, [employer]. (2013-05-06 11:09:58)  

## 2013-10-10 ENCOUNTER — Ambulatory Visit (HOSPITAL_BASED_OUTPATIENT_CLINIC_OR_DEPARTMENT_OTHER): Payer: BC Managed Care – PPO

## 2013-10-10 VITALS — BP 144/98 | HR 101 | Temp 97.5°F

## 2013-10-10 DIAGNOSIS — Z5112 Encounter for antineoplastic immunotherapy: Secondary | ICD-10-CM

## 2013-10-10 DIAGNOSIS — C109 Malignant neoplasm of oropharynx, unspecified: Secondary | ICD-10-CM

## 2013-10-10 MED ORDER — SODIUM CHLORIDE 0.9 % IV SOLN
Freq: Once | INTRAVENOUS | Status: AC
  Administered 2013-10-10: 08:00:00 via INTRAVENOUS

## 2013-10-10 MED ORDER — CETUXIMAB CHEMO IV INJECTION 200 MG/100ML
250.0000 mg/m2 | Freq: Once | INTRAVENOUS | Status: AC
Start: 2013-10-10 — End: 2013-10-10
  Administered 2013-10-10: 400 mg via INTRAVENOUS
  Filled 2013-10-10: qty 200

## 2013-10-10 MED ORDER — DIPHENHYDRAMINE HCL 50 MG/ML IJ SOLN
50.0000 mg | Freq: Once | INTRAMUSCULAR | Status: AC
  Administered 2013-10-10: 50 mg via INTRAVENOUS

## 2013-10-10 MED ORDER — DIPHENHYDRAMINE HCL 50 MG/ML IJ SOLN
INTRAMUSCULAR | Status: AC
  Filled 2013-10-10: qty 1

## 2013-10-10 MED ORDER — HEPARIN SOD (PORK) LOCK FLUSH 100 UNIT/ML IV SOLN
500.0000 [IU] | Freq: Once | INTRAVENOUS | Status: AC | PRN
  Administered 2013-10-10: 500 [IU]
  Filled 2013-10-10: qty 5

## 2013-10-10 MED ORDER — SODIUM CHLORIDE 0.9 % IJ SOLN
10.0000 mL | INTRAMUSCULAR | Status: DC | PRN
  Administered 2013-10-10: 10 mL
  Filled 2013-10-10: qty 10

## 2013-10-10 NOTE — Patient Instructions (Signed)
[facility] Discharge Instructions for Patients Receiving Chemotherapy  Today you received the following chemotherapy agents erbitux  To help prevent nausea and vomiting after your treatment, we encourage you to take your nausea medication as directed  If you develop nausea and vomiting that is not controlled by your nausea medication, call the clinic.   BELOW ARE SYMPTOMS THAT SHOULD BE REPORTED IMMEDIATELY:  *FEVER GREATER THAN 100.5 F  *CHILLS WITH OR WITHOUT FEVER  NAUSEA AND VOMITING THAT IS NOT CONTROLLED WITH YOUR NAUSEA MEDICATION  *UNUSUAL SHORTNESS OF BREATH  *UNUSUAL BRUISING OR BLEEDING  TENDERNESS IN MOUTH AND THROAT WITH OR WITHOUT PRESENCE OF ULCERS  *URINARY PROBLEMS  *BOWEL PROBLEMS  UNUSUAL RASH Items with * indicate a potential emergency and should be followed up as soon as possible.  Feel free to call the clinic you have any questions or concerns. The clinic phone number is [phone].  

## 2013-10-17 ENCOUNTER — Ambulatory Visit (HOSPITAL_BASED_OUTPATIENT_CLINIC_OR_DEPARTMENT_OTHER): Payer: BC Managed Care – PPO

## 2013-10-17 VITALS — BP 117/67 | HR 66 | Temp 97.6°F | Resp 18

## 2013-10-17 DIAGNOSIS — C109 Malignant neoplasm of oropharynx, unspecified: Secondary | ICD-10-CM

## 2013-10-17 DIAGNOSIS — Z5112 Encounter for antineoplastic immunotherapy: Secondary | ICD-10-CM

## 2013-10-17 MED ORDER — HEPARIN SOD (PORK) LOCK FLUSH 100 UNIT/ML IV SOLN
500.0000 [IU] | Freq: Once | INTRAVENOUS | Status: AC | PRN
  Administered 2013-10-17: 500 [IU]
  Filled 2013-10-17: qty 5

## 2013-10-17 MED ORDER — CETUXIMAB CHEMO IV INJECTION 200 MG/100ML
250.0000 mg/m2 | Freq: Once | INTRAVENOUS | Status: AC
  Administered 2013-10-17: 400 mg via INTRAVENOUS
  Filled 2013-10-17: qty 200

## 2013-10-17 MED ORDER — SODIUM CHLORIDE 0.9 % IJ SOLN
10.0000 mL | INTRAMUSCULAR | Status: DC | PRN
  Administered 2013-10-17: 10 mL
  Filled 2013-10-17: qty 10

## 2013-10-17 MED ORDER — DIPHENHYDRAMINE HCL 50 MG/ML IJ SOLN
50.0000 mg | Freq: Once | INTRAMUSCULAR | Status: AC
Start: 2013-10-17 — End: 2013-10-17
  Administered 2013-10-17: 50 mg via INTRAVENOUS

## 2013-10-17 MED ORDER — DIPHENHYDRAMINE HCL 50 MG/ML IJ SOLN
INTRAMUSCULAR | Status: AC
  Filled 2013-10-17: qty 1

## 2013-10-17 MED ORDER — SODIUM CHLORIDE 0.9 % IV SOLN
Freq: Once | INTRAVENOUS | Status: AC
  Administered 2013-10-17: 09:00:00 via INTRAVENOUS

## 2013-10-17 NOTE — Patient Instructions (Signed)
[facility] Discharge Instructions for Patients Receiving Chemotherapy  Today you received the following chemotherapy agents Erbitux  To help prevent nausea and vomiting after your treatment, we encourage you to take your nausea medication as prescribed.   If you develop nausea and vomiting that is not controlled by your nausea medication, call the clinic.   BELOW ARE SYMPTOMS THAT SHOULD BE REPORTED IMMEDIATELY:  *FEVER GREATER THAN 100.5 F  *CHILLS WITH OR WITHOUT FEVER  NAUSEA AND VOMITING THAT IS NOT CONTROLLED WITH YOUR NAUSEA MEDICATION  *UNUSUAL SHORTNESS OF BREATH  *UNUSUAL BRUISING OR BLEEDING  TENDERNESS IN MOUTH AND THROAT WITH OR WITHOUT PRESENCE OF ULCERS  *URINARY PROBLEMS  *BOWEL PROBLEMS  UNUSUAL RASH Items with * indicate a potential emergency and should be followed up as soon as possible.  Feel free to call the clinic you have any questions or concerns. The clinic phone number is [phone].    

## 2013-10-24 ENCOUNTER — Ambulatory Visit (HOSPITAL_BASED_OUTPATIENT_CLINIC_OR_DEPARTMENT_OTHER): Payer: BC Managed Care – PPO | Admitting: Hematology and Oncology

## 2013-10-24 ENCOUNTER — Encounter: Payer: Self-pay | Admitting: Hematology and Oncology

## 2013-10-24 ENCOUNTER — Encounter: Payer: Self-pay | Admitting: *Deleted

## 2013-10-24 ENCOUNTER — Other Ambulatory Visit (HOSPITAL_BASED_OUTPATIENT_CLINIC_OR_DEPARTMENT_OTHER): Payer: BC Managed Care – PPO

## 2013-10-24 ENCOUNTER — Ambulatory Visit: Payer: BC Managed Care – HMO | Admitting: Nutrition

## 2013-10-24 ENCOUNTER — Ambulatory Visit (HOSPITAL_BASED_OUTPATIENT_CLINIC_OR_DEPARTMENT_OTHER): Payer: BC Managed Care – PPO

## 2013-10-24 VITALS — BP 149/83 | HR 92 | Temp 98.2°F | Resp 19 | Ht 73.0 in | Wt 108.5 lb

## 2013-10-24 DIAGNOSIS — C109 Malignant neoplasm of oropharynx, unspecified: Secondary | ICD-10-CM

## 2013-10-24 DIAGNOSIS — T50905A Adverse effect of unspecified drugs, medicaments and biological substances, initial encounter: Secondary | ICD-10-CM

## 2013-10-24 DIAGNOSIS — D630 Anemia in neoplastic disease: Secondary | ICD-10-CM

## 2013-10-24 DIAGNOSIS — R070 Pain in throat: Secondary | ICD-10-CM

## 2013-10-24 DIAGNOSIS — R634 Abnormal weight loss: Secondary | ICD-10-CM

## 2013-10-24 DIAGNOSIS — Z5112 Encounter for antineoplastic immunotherapy: Secondary | ICD-10-CM

## 2013-10-24 DIAGNOSIS — Z5111 Encounter for antineoplastic chemotherapy: Secondary | ICD-10-CM

## 2013-10-24 DIAGNOSIS — D701 Agranulocytosis secondary to cancer chemotherapy: Secondary | ICD-10-CM

## 2013-10-24 DIAGNOSIS — D6959 Other secondary thrombocytopenia: Secondary | ICD-10-CM

## 2013-10-24 DIAGNOSIS — R21 Rash and other nonspecific skin eruption: Secondary | ICD-10-CM

## 2013-10-24 DIAGNOSIS — D72819 Decreased white blood cell count, unspecified: Secondary | ICD-10-CM

## 2013-10-24 DIAGNOSIS — T451X5A Adverse effect of antineoplastic and immunosuppressive drugs, initial encounter: Secondary | ICD-10-CM

## 2013-10-24 LAB — CBC WITH DIFFERENTIAL/PLATELET
BASO%: 0.6 % (ref 0.0–2.0)
Basophils Absolute: 0 10*3/uL (ref 0.0–0.1)
EOS ABS: 0 10*3/uL (ref 0.0–0.5)
EOS%: 0.7 % (ref 0.0–7.0)
HEMATOCRIT: 29.8 % — AB (ref 38.4–49.9)
HGB: 9.7 g/dL — ABNORMAL LOW (ref 13.0–17.1)
LYMPH#: 1.1 10*3/uL (ref 0.9–3.3)
LYMPH%: 40.1 % (ref 14.0–49.0)
MCH: 36.3 pg — ABNORMAL HIGH (ref 27.2–33.4)
MCHC: 32.6 g/dL (ref 32.0–36.0)
MCV: 111.3 fL — ABNORMAL HIGH (ref 79.3–98.0)
MONO#: 0.6 10*3/uL (ref 0.1–0.9)
MONO%: 20.1 % — ABNORMAL HIGH (ref 0.0–14.0)
NEUT%: 38.5 % — ABNORMAL LOW (ref 39.0–75.0)
NEUTROS ABS: 1.1 10*3/uL — AB (ref 1.5–6.5)
Platelets: 97 10*3/uL — ABNORMAL LOW (ref 140–400)
RBC: 2.67 10*6/uL — AB (ref 4.20–5.82)
RDW: 27.6 % — ABNORMAL HIGH (ref 11.0–14.6)
WBC: 2.8 10*3/uL — AB (ref 4.0–10.3)

## 2013-10-24 LAB — COMPREHENSIVE METABOLIC PANEL (CC13)
ALT: 18 U/L (ref 0–55)
ANION GAP: 7 meq/L (ref 3–11)
AST: 27 U/L (ref 5–34)
Albumin: 3.5 g/dL (ref 3.5–5.0)
Alkaline Phosphatase: 63 U/L (ref 40–150)
BILIRUBIN TOTAL: 0.38 mg/dL (ref 0.20–1.20)
BUN: 7.6 mg/dL (ref 7.0–26.0)
CHLORIDE: 101 meq/L (ref 98–109)
CO2: 30 meq/L — AB (ref 22–29)
Calcium: 10 mg/dL (ref 8.4–10.4)
Creatinine: 0.6 mg/dL — ABNORMAL LOW (ref 0.7–1.3)
GLUCOSE: 120 mg/dL (ref 70–140)
Potassium: 4.1 mEq/L (ref 3.5–5.1)
SODIUM: 138 meq/L (ref 136–145)
TOTAL PROTEIN: 8.1 g/dL (ref 6.4–8.3)

## 2013-10-24 LAB — MAGNESIUM (CC13): MAGNESIUM: 2.4 mg/dL (ref 1.5–2.5)

## 2013-10-24 MED ORDER — SODIUM CHLORIDE 0.9 % IV SOLN
1000.0000 mg/m2/d | INTRAVENOUS | Status: DC
  Administered 2013-10-24: 6650 mg via INTRAVENOUS
  Filled 2013-10-24: qty 133

## 2013-10-24 MED ORDER — INFLUENZA VAC SPLIT QUAD 0.5 ML IM SUSY
0.5000 mL | PREFILLED_SYRINGE | Freq: Once | INTRAMUSCULAR | Status: DC
  Filled 2013-10-24: qty 0.5

## 2013-10-24 MED ORDER — DIPHENHYDRAMINE HCL 50 MG/ML IJ SOLN
INTRAMUSCULAR | Status: AC
Start: 2013-10-24 — End: 2013-10-24
  Filled 2013-10-24: qty 1

## 2013-10-24 MED ORDER — MORPHINE SULFATE 15 MG PO TABS: 15.0000 mg | ORAL_TABLET | ORAL | Status: DC | PRN

## 2013-10-24 MED ORDER — CARBOPLATIN CHEMO INJECTION 600 MG/60ML
510.0000 mg | Freq: Once | INTRAVENOUS | Status: AC
  Administered 2013-10-24: 510 mg via INTRAVENOUS
  Filled 2013-10-24: qty 51

## 2013-10-24 MED ORDER — SODIUM CHLORIDE 0.9 % IV SOLN
Freq: Once | INTRAVENOUS | Status: AC
  Administered 2013-10-24: 11:00:00 via INTRAVENOUS

## 2013-10-24 MED ORDER — DEXAMETHASONE SODIUM PHOSPHATE 20 MG/5ML IJ SOLN
20.0000 mg | Freq: Once | INTRAMUSCULAR | Status: AC
  Administered 2013-10-24: 20 mg via INTRAVENOUS

## 2013-10-24 MED ORDER — CETUXIMAB CHEMO IV INJECTION 200 MG/100ML
250.0000 mg/m2 | Freq: Once | INTRAVENOUS | Status: AC
  Administered 2013-10-24: 400 mg via INTRAVENOUS
  Filled 2013-10-24: qty 200

## 2013-10-24 MED ORDER — DEXAMETHASONE SODIUM PHOSPHATE 20 MG/5ML IJ SOLN
INTRAMUSCULAR | Status: AC
  Filled 2013-10-24: qty 5

## 2013-10-24 MED ORDER — ONDANSETRON 16 MG/50ML IVPB (CHCC)
16.0000 mg | Freq: Once | INTRAVENOUS | Status: AC
  Administered 2013-10-24: 16 mg via INTRAVENOUS

## 2013-10-24 MED ORDER — DIPHENHYDRAMINE HCL 50 MG/ML IJ SOLN
50.0000 mg | Freq: Once | INTRAMUSCULAR | Status: AC
  Administered 2013-10-24: 50 mg via INTRAVENOUS

## 2013-10-24 NOTE — Progress Notes (Signed)
[facility] OFFICE PROGRESS NOTE  Patient Care Team: No Pcp Per Patient as PCP - General (General Practice) Heath Lark, MD as Consulting Physician (Hematology and Oncology) Ascencion Dike, MD as Consulting Physician (Otolaryngology) Brooks Sailors, RN as Oncology Nurse Navigator (Oncology)  SUMMARY OF ONCOLOGIC HISTORY: Oncology History   Oropharyngeal cancer, HPV negative   Primary site: Pharynx - Oropharynx   Staging method: AJCC 7th Edition   Clinical: Stage IVC (T4b, N3, M1) signed by Heath Lark, MD on 07/01/2013  8:32 PM   Summary: Stage IVC (T4b, N3, M1)         Oropharyngeal cancer   05/06/2013 Imaging 4.2 x 4.7 x 6.1 cm heterogeneous right neck mass in the right level 2 & level 3 stations. It invades the carotid space, obstructing the internal jugular vein. The primary lesion may be along the inferior aspect of the right palatine tonsil    05/22/2013 Procedure Laryngoscopy revealed abnormalities beneath the right tonsil area. Unfortunately tonsillectomy and right neck biopsy came back benign tissue.   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   06/24/2013 Surgery He underwent multiple extraction of tooth numbers 2, 3, 6, 8, 9, 11, 12, 13, 14, 15, 19, 20, 22, 23, 24, 25, 26, 27, and 28 along with 4 Quadrants of alveoloplasty   06/28/2013 Imaging PET/CT scan results show widespread pulmonary metastasis.   07/11/2013 -  Chemotherapy The patient is started on palliative chemotherapy with 5-FU, carboplatin and weekly Erbitux.   09/09/2013 Imaging Repeat PET CT scan show good response to treatment.    INTERVAL HISTORY: Please see below for problem oriented charting. He seen prior to cycle 6 of treatment. Apart from mild acne on his face he denies any new symptoms. REVIEW OF SYSTEMS:   Constitutional: Denies fevers, chills or abnormal weight loss Eyes: Denies blurriness of vision Ears, nose, mouth, throat, and face: Denies mucositis  or sore throat Respiratory: Denies cough, dyspnea or wheezes Cardiovascular: Denies palpitation, chest discomfort or lower extremity swelling Gastrointestinal:  Denies nausea, heartburn or change in bowel habits Lymphatics: Denies new lymphadenopathy or easy bruising Neurological:Denies numbness, tingling or new weaknesses Behavioral/Psych: Mood is stable, no new changes  All other systems were reviewed with the patient and are negative.  I have reviewed the past medical history, past surgical history, social history and family history with the patient and they are unchanged from previous note.  ALLERGIES:  has No Known Allergies.  MEDICATIONS:  Current Outpatient Prescriptions  Medication Sig Dispense Refill  . docusate sodium (COLACE) 100 MG capsule Take 1 capsule (100 mg total) by mouth 2 (two) times daily.  60 capsule  0  . lidocaine-prilocaine (EMLA) cream Apply 1 application topically as needed.  30 g  6  . morphine (MSIR) 15 MG tablet Take 1 tablet (15 mg total) by mouth every 4 (four) hours as needed for severe pain.  60 tablet  0  . ondansetron (ZOFRAN) 8 MG tablet Take 1 tablet (8 mg total) by mouth every 8 (eight) hours as needed for nausea or vomiting.  30 tablet  1  . prochlorperazine (COMPAZINE) 10 MG tablet Take 1 tablet (10 mg total) by mouth every 6 (six) hours as needed (Nausea or vomiting).  30 tablet  1   No current facility-administered medications for this visit.    PHYSICAL EXAMINATION: ECOG PERFORMANCE STATUS: 1 - Symptomatic but completely ambulatory  Filed Vitals:   10/24/13 0926  BP: 149/83  Pulse:  92  Temp: 98.2 F (36.8 C)  Resp: 19   Filed Weights   10/24/13 0926  Weight: 108 lb 8 oz (49.215 kg)    GENERAL:alert, no distress and comfortable. He looks thin and cachectic SKIN: He has mild acne on his face. None elsewhere  EYES: normal, Conjunctiva are pink and non-injected, sclera clear OROPHARYNX:no exudate, no erythema and lips, buccal mucosa,  and tongue normal  NECK: His neck mass is getting smaller. LYMPH:  no palpable lymphadenopathy in the cervical, axillary or inguinal LUNGS: clear to auscultation and percussion with normal breathing effort HEART: regular rate & rhythm and no murmurs and no lower extremity edema ABDOMEN:abdomen soft, non-tender and normal bowel sounds Musculoskeletal:no cyanosis of digits and no clubbing  NEURO: alert & oriented x 3 with fluent speech, no focal motor/sensory deficits  LABORATORY DATA:  I have reviewed the data as listed    Component Value Date/Time   NA 138 10/24/2013 0915   K 4.1 10/24/2013 0915   CO2 30* 10/24/2013 0915   GLUCOSE 120 10/24/2013 0915   BUN 7.6 10/24/2013 0915   CREATININE 0.6* 10/24/2013 0915   CALCIUM 10.0 10/24/2013 0915   PROT 8.1 10/24/2013 0915   ALBUMIN 3.5 10/24/2013 0915   AST 27 10/24/2013 0915   ALT 18 10/24/2013 0915   ALKPHOS 63 10/24/2013 0915   BILITOT 0.38 10/24/2013 0915    No results found for this basename: SPEP, UPEP,  kappa and lambda light chains    Lab Results  Component Value Date   WBC 2.8* 10/24/2013   NEUTROABS 1.1* 10/24/2013   HGB 9.7* 10/24/2013   HCT 29.8* 10/24/2013   MCV 111.3* 10/24/2013   PLT 97* 10/24/2013      Chemistry      Component Value Date/Time   NA 138 10/24/2013 0915   K 4.1 10/24/2013 0915   CO2 30* 10/24/2013 0915   BUN 7.6 10/24/2013 0915   CREATININE 0.6* 10/24/2013 0915      Component Value Date/Time   CALCIUM 10.0 10/24/2013 0915   ALKPHOS 63 10/24/2013 0915   AST 27 10/24/2013 0915   ALT 18 10/24/2013 0915   BILITOT 0.38 10/24/2013 0915      ASSESSMENT & PLAN:  Oropharyngeal cancer He responded well to treatment. I will continue the same dose without dosage adjustment. I plan to give him total 6 cycles of chemotherapy and then restage again with PET/CT scan. The patient would be a candidate for maintenance Erbitux in the future after he completed 6 cycles of chemotherapy. I will add Neulasta to day 8 due to  neutropenia    Leukopenia due to antineoplastic chemotherapy This is likely due to recent treatment. The patient denies recent history of fevers, cough, chills, diarrhea or dysuria. He is asymptomatic from the leukopenia. I will observe for now.  I will continue the chemotherapy at current dose without dosage adjustment.  If the leukopenia gets progressive worse in the future, I might have to delay his treatment or adjust the chemotherapy dose. I will add Neulasta to day 8 due to neutropenia    Weight loss I recommend he increase oral supplement intake. He denies dysphagia. I have requested the dietitian to see him.  Throat pain I refilled his prescription morphine sulfate. We discussed about narcotic refill policy. I am attempting to wean his pain medication requirement to maximum 3 times a day as needed.   Skin rash He has mild grade 1 skin toxicity from cetuximab. I recommend using over-the-counter  hydrocortisone cream with Benadryl as needed. If the skin rash gets worse, we might have to use topical antibiotic cream or oral antibiotic therapy. I told the patient to avoid excessive sun exposure.  Anemia in neoplastic disease This is likely due to recent treatment. The patient denies recent history of bleeding such as epistaxis, hematuria or hematochezia. He is asymptomatic from the anemia. I will observe for now.  He does not require transfusion now. I will continue the chemotherapy at current dose without dosage adjustment.  If the anemia gets progressive worse in the future, I might have to delay his treatment or adjust the chemotherapy dose.  Thrombocytopenia due to drugs This is likely due to recent treatment. The patient denies recent history of bleeding such as epistaxis, hematuria or hematochezia. He is asymptomatic from the low platelet count. I will observe for now.  he does not require transfusion now. I will continue the chemotherapy at current dose without dosage  adjustment.  If the thrombocytopenia gets progressive worse in the future, I might have to delay his treatment or adjust the chemotherapy dose.   Orders Placed This Encounter  Procedures  . NM PET Image Restag (PS) Skull Base To Thigh    Standing Status: Future     Number of Occurrences:      Standing Expiration Date: 12/24/2014    Order Specific Question:  Reason for Exam (SYMPTOM  OR DIAGNOSIS REQUIRED)    Answer:  staging head & neck cancer, assess response to Rx    Order Specific Question:  Preferred imaging location?    Answer:  [facility]   All questions were answered. The patient knows to call the clinic with any problems, questions or concerns. No barriers to learning was detected. I spent 30 minutes counseling the patient face to face. The total time spent in the appointment was 60 minutes and more than 50% was on counseling and review of test results     [facility], [place], MD 10/24/2013 7:39 PM

## 2013-10-24 NOTE — Assessment & Plan Note (Signed)
This is likely due to recent treatment. The patient denies recent history of bleeding such as epistaxis, hematuria or hematochezia. He is asymptomatic from the anemia. I will observe for now.  He does not require transfusion now. I will continue the chemotherapy at current dose without dosage adjustment.  If the anemia gets progressive worse in the future, I might have to delay his treatment or adjust the chemotherapy dose.  

## 2013-10-24 NOTE — Assessment & Plan Note (Signed)
I refilled his prescription morphine sulfate. We discussed about narcotic refill policy. I am attempting to wean his pain medication requirement to maximum 3 times a day as needed.

## 2013-10-24 NOTE — Patient Instructions (Signed)
[facility] Discharge Instructions for Patients Receiving Chemotherapy  Today you received the following chemotherapy agents Carboplatin and 5FU.  To help prevent nausea and vomiting after your treatment, we encourage you to take your nausea medication as prescribed.   If you develop nausea and vomiting that is not controlled by your nausea medication, call the clinic.   BELOW ARE SYMPTOMS THAT SHOULD BE REPORTED IMMEDIATELY:  *FEVER GREATER THAN 100.5 F  *CHILLS WITH OR WITHOUT FEVER  NAUSEA AND VOMITING THAT IS NOT CONTROLLED WITH YOUR NAUSEA MEDICATION  *UNUSUAL SHORTNESS OF BREATH  *UNUSUAL BRUISING OR BLEEDING  TENDERNESS IN MOUTH AND THROAT WITH OR WITHOUT PRESENCE OF ULCERS  *URINARY PROBLEMS  *BOWEL PROBLEMS  UNUSUAL RASH Items with * indicate a potential emergency and should be followed up as soon as possible.  Feel free to call the clinic you have any questions or concerns. The clinic phone number is [phone].

## 2013-10-24 NOTE — Assessment & Plan Note (Signed)
He has mild grade 1 skin toxicity from cetuximab. I recommend using over-the-counter hydrocortisone cream with Benadryl as needed. If the skin rash gets worse, we might have to use topical antibiotic cream or oral antibiotic therapy. I told the patient to avoid excessive sun exposure.

## 2013-10-24 NOTE — Assessment & Plan Note (Signed)
He responded well to treatment. I will continue the same dose without dosage adjustment. I plan to give him total 6 cycles of chemotherapy and then restage again with PET/CT scan. The patient would be a candidate for maintenance Erbitux in the future after he completed 6 cycles of chemotherapy. I will add Neulasta to day 8 due to neutropenia

## 2013-10-24 NOTE — Assessment & Plan Note (Signed)
This is likely due to recent treatment. The patient denies recent history of bleeding such as epistaxis, hematuria or hematochezia. He is asymptomatic from the low platelet count. I will observe for now.  he does not require transfusion now. I will continue the chemotherapy at current dose without dosage adjustment.  If the thrombocytopenia gets progressive worse in the future, I might have to delay his treatment or adjust the chemotherapy dose.   

## 2013-10-24 NOTE — Progress Notes (Signed)
Nutrition followup completed with patient during chemotherapy.  He has being treated for tonsil cancer. Current weight documented as 108.5 pounds October 8 up slightly from 107.7 pounds September 17. Patient reports he continues to try to eat blenderize foods.  He tells me he is drinking 6 boost plus daily.  He was recently able to purchase two cases from [place]. Patient denies nutrition side effects.  Nutrition diagnosis: Inadequate oral intake improved.  Intervention: Patient to continue boost +6 times a day. Encouraged increased intake of pured foods to promote weight gain. Provided additional samples of boost and boost plus for patient. Teach back method used.  Monitoring, evaluation, goals: Patient will increase oral intake and continue to use oral nutrition supplements to promote weight gain.  Next visit: Not scheduled at this time.  I will continue to monitor patient and follow as needed.   **Disclaimer: This note was dictated with voice recognition software. Similar sounding words can inadvertently be transcribed and this note may contain transcription errors which may not have been corrected upon publication of note.**

## 2013-10-24 NOTE — Assessment & Plan Note (Addendum)
I recommend he increase oral supplement intake. He denies dysphagia. I have requested the dietitian to see him.

## 2013-10-24 NOTE — Assessment & Plan Note (Signed)
This is likely due to recent treatment. The patient denies recent history of fevers, cough, chills, diarrhea or dysuria. He is asymptomatic from the leukopenia. I will observe for now.  I will continue the chemotherapy at current dose without dosage adjustment.  If the leukopenia gets progressive worse in the future, I might have to delay his treatment or adjust the chemotherapy dose. I will add Neulasta to day 8 due to neutropenia

## 2013-10-25 ENCOUNTER — Telehealth: Payer: Self-pay | Admitting: Hematology and Oncology

## 2013-10-25 NOTE — Telephone Encounter (Signed)
not able to reach pt at home number. lmonvm on cell re appts for 10/15, 10/22 and 11/5. pt to get new schedule at 10/12 appt.

## 2013-10-27 NOTE — Progress Notes (Signed)
To provide support and encouragement, care continuity and to assess for needs, met with patient during appt with Dr. Alvy Bimler.  6th chemo cycle next week. PET to be scheduled.  Dr. Alvy Bimler explained that if PET indicates active sites evident then RT recommended; if no activity, maintenance chemo will be recommended; he verbalized understanding.  He denied any needs, he understands that he can call me should that change.  Gayleen Orem, RN, BSN, [facility] [phone]

## 2013-10-28 ENCOUNTER — Ambulatory Visit: Payer: BC Managed Care – PPO

## 2013-10-28 ENCOUNTER — Ambulatory Visit (HOSPITAL_BASED_OUTPATIENT_CLINIC_OR_DEPARTMENT_OTHER): Payer: BC Managed Care – PPO

## 2013-10-28 VITALS — BP 120/78 | HR 94 | Temp 97.5°F

## 2013-10-28 DIAGNOSIS — C109 Malignant neoplasm of oropharynx, unspecified: Secondary | ICD-10-CM

## 2013-10-28 MED ORDER — SODIUM CHLORIDE 0.9 % IJ SOLN
10.0000 mL | INTRAMUSCULAR | Status: DC | PRN
  Administered 2013-10-28: 10 mL
  Filled 2013-10-28: qty 10

## 2013-10-28 MED ORDER — HEPARIN SOD (PORK) LOCK FLUSH 100 UNIT/ML IV SOLN
500.0000 [IU] | Freq: Once | INTRAVENOUS | Status: AC | PRN
  Administered 2013-10-28: 500 [IU]
  Filled 2013-10-28: qty 5

## 2013-10-31 ENCOUNTER — Ambulatory Visit: Payer: BC Managed Care – PPO

## 2013-10-31 ENCOUNTER — Ambulatory Visit (HOSPITAL_BASED_OUTPATIENT_CLINIC_OR_DEPARTMENT_OTHER): Payer: BC Managed Care – PPO

## 2013-10-31 ENCOUNTER — Ambulatory Visit: Payer: BC Managed Care – HMO

## 2013-10-31 VITALS — BP 151/85 | HR 92 | Temp 97.7°F

## 2013-10-31 DIAGNOSIS — Z5112 Encounter for antineoplastic immunotherapy: Secondary | ICD-10-CM

## 2013-10-31 DIAGNOSIS — Z5189 Encounter for other specified aftercare: Secondary | ICD-10-CM

## 2013-10-31 DIAGNOSIS — C109 Malignant neoplasm of oropharynx, unspecified: Secondary | ICD-10-CM

## 2013-10-31 MED ORDER — DIPHENHYDRAMINE HCL 50 MG/ML IJ SOLN
50.0000 mg | Freq: Once | INTRAMUSCULAR | Status: AC
  Administered 2013-10-31: 50 mg via INTRAVENOUS

## 2013-10-31 MED ORDER — DIPHENHYDRAMINE HCL 50 MG/ML IJ SOLN
INTRAMUSCULAR | Status: AC
  Filled 2013-10-31: qty 1

## 2013-10-31 MED ORDER — HEPARIN SOD (PORK) LOCK FLUSH 100 UNIT/ML IV SOLN
500.0000 [IU] | Freq: Once | INTRAVENOUS | Status: AC | PRN
  Administered 2013-10-31: 500 [IU]
  Filled 2013-10-31: qty 5

## 2013-10-31 MED ORDER — CETUXIMAB CHEMO IV INJECTION 200 MG/100ML
250.0000 mg/m2 | Freq: Once | INTRAVENOUS | Status: AC
  Administered 2013-10-31: 400 mg via INTRAVENOUS
  Filled 2013-10-31: qty 200

## 2013-10-31 MED ORDER — PEGFILGRASTIM INJECTION 6 MG/0.6ML
6.0000 mg | Freq: Once | SUBCUTANEOUS | Status: AC
Start: 2013-10-31 — End: 2013-10-31
  Administered 2013-10-31: 6 mg via SUBCUTANEOUS
  Filled 2013-10-31: qty 0.6

## 2013-10-31 MED ORDER — SODIUM CHLORIDE 0.9 % IV SOLN
Freq: Once | INTRAVENOUS | Status: AC
  Administered 2013-10-31: 11:00:00 via INTRAVENOUS

## 2013-10-31 MED ORDER — SODIUM CHLORIDE 0.9 % IJ SOLN
10.0000 mL | INTRAMUSCULAR | Status: DC | PRN
  Administered 2013-10-31: 10 mL
  Filled 2013-10-31: qty 10

## 2013-10-31 NOTE — Patient Instructions (Signed)
Cetuximab injection What is this medicine? CETUXIMAB (se TUX i mab) is a chemotherapy drug. It targets a specific protein within cancer cells and stops the cells from growing. It is used to treat colorectal cancer and head and neck cancer. This medicine may be used for other purposes; ask your health care provider or pharmacist if you have questions. COMMON BRAND NAME(S): Erbitux What should I tell my health care provider before I take this medicine? They need to know if you have any of these conditions: -heart disease -history of irregular heartbeat -history of low levels of calcium, magnesium, or potassium in the blood -lung or breathing disease, like asthma -an unusual or allergic reaction to cetuximab, other medicines, foods, dyes, or preservatives -pregnant or trying to get pregnant -breast-feeding How should I use this medicine? This drug is given as an infusion into a vein. It is administered in a hospital or clinic by a specially trained health care professional. Talk to your pediatrician regarding the use of this medicine in children. Special care may be needed. Overdosage: If you think you have taken too much of this medicine contact a poison control center or emergency room at once. NOTE: This medicine is only for you. Do not share this medicine with others. What if I miss a dose? It is important not to miss your dose. Call your doctor or health care professional if you are unable to keep an appointment. What may interact with this medicine? Interactions are not expected. This list may not describe all possible interactions. Give your health care provider a list of all the medicines, herbs, non-prescription drugs, or dietary supplements you use. Also tell them if you smoke, drink alcohol, or use illegal drugs. Some items may interact with your medicine. What should I watch for while using this medicine? Visit your doctor or health care professional for regular checks on your  progress. This drug may make you feel generally unwell. This is not uncommon, as chemotherapy can affect healthy cells as well as cancer cells. Report any side effects. Continue your course of treatment even though you feel ill unless your doctor tells you to stop. This medicine can make you more sensitive to the sun. Keep out of the sun while taking this medicine and for 2 months after the last dose. If you cannot avoid being in the sun, wear protective clothing and use sunscreen. Do not use sun lamps or tanning beds/booths. You may need blood work done while you are taking this medicine. In some cases, you may be given additional medicines to help with side effects. Follow all directions for their use. Call your doctor or health care professional for advice if you get a fever, chills or sore throat, or other symptoms of a cold or flu. Do not treat yourself. This drug decreases your body's ability to fight infections. Try to avoid being around people who are sick. Avoid taking products that contain aspirin, acetaminophen, ibuprofen, naproxen, or ketoprofen unless instructed by your doctor. These medicines may hide a fever. Do not become pregnant while taking this medicine. Women should inform their doctor if they wish to become pregnant or think they might be pregnant. There is a potential for serious side effects to an unborn child. Use adequate birth control methods. Avoid pregnancy for at least 6 months after your last dose. Talk to your health care professional or pharmacist for more information. Do not breast-feed an infant while taking this medicine or during the 2 months after your last   dose. What side effects may I notice from receiving this medicine? Side effects that you should report to your doctor or health care professional as soon as possible: -allergic reactions like skin rash, itching or hives, swelling of the face, lips, or tongue -breathing problems -changes in vision -fast, irregular  heartbeat -feeling faint or lightheaded, falls -fever, chills -mouth sores -redness, blistering, peeling or loosening of the skin, including inside the mouth -trouble passing urine or change in the amount of urine -unusually weak or tired Side effects that usually do not require medical attention (report to your doctor or health care professional if they continue or are bothersome): -changes in skin like acne, cracks, skin dryness -constipation -diarrhea -headache -nail changes -nausea, vomiting -stomach upset -weight loss This list may not describe all possible side effects. Call your doctor for medical advice about side effects. You may report side effects to FDA at 1-800-FDA-1088. Where should I keep my medicine? This drug is given in a hospital or clinic and will not be stored at home. NOTE: This sheet is a summary. It may not cover all possible information. If you have questions about this medicine, talk to your doctor, pharmacist, or health care provider.  2015, [employer]. (2013-04-17 16:14:34)  

## 2013-11-07 ENCOUNTER — Ambulatory Visit (HOSPITAL_BASED_OUTPATIENT_CLINIC_OR_DEPARTMENT_OTHER): Payer: BC Managed Care – PPO

## 2013-11-07 ENCOUNTER — Ambulatory Visit: Payer: BC Managed Care – PPO | Admitting: Nutrition

## 2013-11-07 VITALS — BP 115/96 | HR 86 | Temp 97.9°F | Resp 18

## 2013-11-07 DIAGNOSIS — C109 Malignant neoplasm of oropharynx, unspecified: Secondary | ICD-10-CM

## 2013-11-07 DIAGNOSIS — Z5112 Encounter for antineoplastic immunotherapy: Secondary | ICD-10-CM

## 2013-11-07 MED ORDER — CETUXIMAB CHEMO IV INJECTION 200 MG/100ML
250.0000 mg/m2 | Freq: Once | INTRAVENOUS | Status: AC
  Administered 2013-11-07: 400 mg via INTRAVENOUS
  Filled 2013-11-07: qty 200

## 2013-11-07 MED ORDER — DIPHENHYDRAMINE HCL 50 MG/ML IJ SOLN
INTRAMUSCULAR | Status: AC
  Filled 2013-11-07: qty 1

## 2013-11-07 MED ORDER — SODIUM CHLORIDE 0.9 % IV SOLN
Freq: Once | INTRAVENOUS | Status: AC
  Administered 2013-11-07: 12:00:00 via INTRAVENOUS

## 2013-11-07 MED ORDER — SODIUM CHLORIDE 0.9 % IJ SOLN
10.0000 mL | INTRAMUSCULAR | Status: DC | PRN
  Administered 2013-11-07: 10 mL
  Filled 2013-11-07: qty 10

## 2013-11-07 MED ORDER — HEPARIN SOD (PORK) LOCK FLUSH 100 UNIT/ML IV SOLN
500.0000 [IU] | Freq: Once | INTRAVENOUS | Status: AC | PRN
  Administered 2013-11-07: 500 [IU]
  Filled 2013-11-07: qty 5

## 2013-11-07 MED ORDER — DIPHENHYDRAMINE HCL 50 MG/ML IJ SOLN
50.0000 mg | Freq: Once | INTRAMUSCULAR | Status: AC
  Administered 2013-11-07: 50 mg via INTRAVENOUS

## 2013-11-07 NOTE — Progress Notes (Signed)
Nutrition followup completed with patient in chemotherapy room.  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 Last weight documented as 108.5 pounds October 8. Patient states he continues to consume blenderized foods and is drinking 6 boost plus daily. Denies nutrition side effects.  Nutrition diagnosis: Inadequate oral intake continues.  Intervention:  Patient educated to continue strategies for high-calorie, high-protein foods to promote weight gain.   Provided coupons for patient to purchase additional boost plus.   Teach back method used.  Monitoring, evaluation, goals: Patient will continue to work to increase oral intake to promote weight gain.    Next visit: Will continue to follow patient as needed.  He has my contact information.  **Disclaimer: This note was dictated with voice recognition software. Similar sounding words can inadvertently be transcribed and this note may contain transcription errors which may not have been corrected upon publication of note.**

## 2013-11-07 NOTE — Patient Instructions (Signed)
[facility] Discharge Instructions for Patients Receiving Chemotherapy  Today you received the following chemotherapy agents: Erbitux.  To help prevent nausea and vomiting after your treatment, we encourage you to take your nausea medication: Compazine 10mg  every 6 hours as needed.  Zofran 8mg  every 8 hours as needed.   If you develop nausea and vomiting that is not controlled by your nausea medication, call the clinic.   BELOW ARE SYMPTOMS THAT SHOULD BE REPORTED IMMEDIATELY:  *FEVER GREATER THAN 100.5 F  *CHILLS WITH OR WITHOUT FEVER  NAUSEA AND VOMITING THAT IS NOT CONTROLLED WITH YOUR NAUSEA MEDICATION  *UNUSUAL SHORTNESS OF BREATH  *UNUSUAL BRUISING OR BLEEDING  TENDERNESS IN MOUTH AND THROAT WITH OR WITHOUT PRESENCE OF ULCERS  *URINARY PROBLEMS  *BOWEL PROBLEMS  UNUSUAL RASH Items with * indicate a potential emergency and should be followed up as soon as possible.  Feel free to call the clinic you have any questions or concerns. The clinic phone number is [phone].

## 2013-11-14 ENCOUNTER — Encounter: Payer: Self-pay | Admitting: Radiation Oncology

## 2013-11-14 ENCOUNTER — Ambulatory Visit (HOSPITAL_COMMUNITY)
Admission: RE | Admit: 2013-11-14 | Discharge: 2013-11-14 | Disposition: A | Discharge status: Home / Self Care | Payer: BC Managed Care – PPO | Source: Ambulatory Visit | Attending: Hematology and Oncology | Admitting: Hematology and Oncology

## 2013-11-14 DIAGNOSIS — C760 Malignant neoplasm of head, face and neck: Secondary | ICD-10-CM | POA: Diagnosis present

## 2013-11-14 DIAGNOSIS — C109 Malignant neoplasm of oropharynx, unspecified: Secondary | ICD-10-CM

## 2013-11-14 DIAGNOSIS — C7951 Secondary malignant neoplasm of bone: Secondary | ICD-10-CM | POA: Insufficient documentation

## 2013-11-14 LAB — GLUCOSE, CAPILLARY: Glucose-Capillary: 123 mg/dL — ABNORMAL HIGH (ref 70–99)

## 2013-11-14 MED ORDER — FLUDEOXYGLUCOSE F - 18 (FDG) INJECTION
5.3400 | Freq: Once | INTRAVENOUS | Status: AC | PRN
  Administered 2013-11-14: 5.34 via INTRAVENOUS

## 2013-11-14 NOTE — Progress Notes (Addendum)
Head and Neck Cancer Location of Tumor / Histology: Metastatic Squamous Cell Carcinoma - Right Neck mass  Patient presented in May 2015 with a large, rockhard mass of lymphadenopathy in the right anterior cervical area which was nontender to touch. Large right Level II neck mass with right vocal cord paralysis: He proceeded to have Induction chemotherapy  Biopsies of Right Neck Mass  revealed:   06/07/13  Soft tissue mass, biopsy, Right neck  - METASTATIC SQUAMOUS CELL CARCINOMA  Right vocal cord paralysis  05/22/13  Diagnosis  1. Tonsil, Right  - BENIGN LYMPHOID HYPERPLASIA.  - CALCULUS.  - THERE IS NO EVIDENCE OF MALIGNANCY.  2. Soft tissue mass, simple excision, Right neck  - MILDLY INFLAMED SKELETAL MUSCLE, ADIPOSE TISSUE, AND FIBROUS TISSUE.  - THERE IS NO EVIDENCE OF MALIGNANCY   Nutrition Status:  Weight changes: 108.2 lbs on 11/18/13. Weight 112 in July 2015  Swallowing status: good if he eats only soft foods, takes small bites, swallows carefully  Plans, if any, for PEG tube: no  Tobacco/Marijuana/Snuff/ETOH use:The patient is a 40+ pack-year smoker. Currently smokes 3 cigarettes per day, using e cigarettes. Drinks about 6 beers per week, mostly on the weekends.  11/18/2013 states he is not smoking or using e cigs, is no longer drinking beer or liquor, however there is a strong smoke odor about him today  Past/Anticipated interventions by otolaryngology, if any: biopsies  Past/Anticipated interventions by medical oncology, if any: Induction chemotherapy-Erbitux/carboplatin & 5FU, last chemo 1 1/2  weeks ago  Referrals yet, to any of the following?  Social Work? In [facility] in June 2015       Dentistry?  06/24/13 Multiple Tooth extractions and an alveoloplasty  Swallowing therapy? Has seen Almyra Deforest      Nutrition? Continues to consume blenderized foods and is drinking 6 boost plus daily.  Med/Onc? DR. Ni Gorsuch -  Erbitux/carboplatin & 5FU  PEG placement? No, patient does  not want peg tube  SAFETY ISSUES:  Prior radiation? NO  Pacemaker/ICD? NO  Possible current pregnancy? NA  Is the patient on methotrexate? NO  Current Complaints / other details:  Eating soft foods only, drinks Boost Plus 6 cans daily. Refuses peg tube. Pain 2/10 in right neck this morning; takes Morphine SR 15 mg every 4 hours prn with good relief.

## 2013-11-18 ENCOUNTER — Ambulatory Visit
Admission: RE | Admit: 2013-11-18 | Discharge: 2013-11-18 | Disposition: A | Discharge status: Home / Self Care | Payer: BC Managed Care – PPO | Source: Ambulatory Visit | Attending: Radiation Oncology | Admitting: Radiation Oncology

## 2013-11-18 ENCOUNTER — Encounter: Payer: Self-pay | Admitting: Radiation Oncology

## 2013-11-18 DIAGNOSIS — C01 Malignant neoplasm of base of tongue: Secondary | ICD-10-CM

## 2013-11-18 DIAGNOSIS — Z510 Encounter for antineoplastic radiation therapy: Secondary | ICD-10-CM | POA: Insufficient documentation

## 2013-11-18 DIAGNOSIS — Z87891 Personal history of nicotine dependence: Secondary | ICD-10-CM | POA: Insufficient documentation

## 2013-11-18 DIAGNOSIS — Z9221 Personal history of antineoplastic chemotherapy: Secondary | ICD-10-CM | POA: Insufficient documentation

## 2013-11-18 DIAGNOSIS — C7951 Secondary malignant neoplasm of bone: Secondary | ICD-10-CM | POA: Diagnosis not present

## 2013-11-18 NOTE — Progress Notes (Signed)
Radiation Oncology         [phone] ________________________________  Outpatient Re-Consultation  Name: Derek Morgan MRN: [id number]  Date: 11/18/2013  DOB: 10/03/1955  CC:No PCP Per Patient  Ascencion Dike, MD   REFERRING PHYSICIAN: Ascencion Dike, MD  DIAGNOSIS:    ICD-9-CM ICD-10-CM   1. 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 Unfortunately, his recent PET showed progression in his bulky right neck tumor, a C5 metastasis, and persistent lung nodules.  He reports eating soft foods only, drinks Boost Plus 6 cans daily. Refuses peg tube. Pain 2/10 in right neck this morning; takes Morphine SR 15 mg every 4 hours prn with good relief.  Quit ETOH, smoke E-cigs. He is s/p 06/24/13 Multiple Tooth extractions and an alveoloplasty. He has seen Almyra Deforest SLP.    He has neck discomfort/pain but no HA's.  PREVIOUS RADIATION THERAPY: No  PAST MEDICAL HISTORY:  has a past medical history of Heavy smoker; Hoarseness of voice; Mass in neck (04/2013); Loose, teeth (06/24/2013); Weight loss (06/17/2013); Throat pain (06/17/2013); Shortness of breath; and Cancer.    PAST SURGICAL HISTORY: Past Surgical History  Procedure Laterality Date  . Broken leg  1998    orif ankle-left-plated  . Direct laryngoscopy N/A 05/22/2013    Procedure: DIRECT LARYNGOSCOPY;  Surgeon: Ascencion Dike, MD;  Location: [facility] OR;  Service: ENT;  Laterality: N/A;  . Tonsillectomy Right 05/22/2013    Procedure: RIGHT TONSILLECTOMY;  Surgeon: Ascencion Dike, MD;  Location: [place];  Service: ENT;  Laterality: Right;  . Mass biopsy Right 05/22/2013    Procedure:  INCISIONAL BIOPSY NECK MASS;  Surgeon: Ascencion Dike, MD;  Location: [place];  Service: ENT;  Laterality: Right;  . Mass biopsy Right 06/07/2013    Procedure: EXCISIONAL BIOPSY OF RIGHT NECK MASS;  Surgeon: Ascencion Dike, MD;  Location: [place];   Service: ENT;  Laterality: Right;  . Multiple extractions with alveoloplasty N/A 06/24/2013    Procedure: Extraction of tooth #'s 2,3,6,8,9,11,12,13,14,15,19,20,22,23,24,25,26,27,28 with alveoloplasty.;  Surgeon: Lenn Cal, DDS;  Location: [place];  Service: Oral Surgery;  Laterality: N/A;  . Portacath placement Left 07/04/2013    Procedure: INSERTION PORT-A-CATH;  Surgeon: Merrie Roof, MD;  Location: [place];  Service: General;  Laterality: Left;    FAMILY HISTORY: family history includes CAD in his father; CVA in his mother; Cancer in his father.  SOCIAL HISTORY:  reports that he has quit smoking. His smoking use included Cigarettes. He has a 10 pack-year smoking history. He has never used smokeless tobacco. He reports that he drinks about 3.6 oz of alcohol per week. He reports that he does not use illicit drugs.  ALLERGIES: Review of patient's allergies indicates no known allergies.  MEDICATIONS:  Current Outpatient Prescriptions  Medication Sig Dispense Refill  . docusate sodium (COLACE) 100 MG capsule Take 1 capsule (100 mg total) by mouth 2 (two) times daily. 60 capsule 0  . lidocaine-prilocaine (EMLA) cream Apply 1 application topically as needed. 30 g 6  . morphine (MSIR) 15 MG tablet Take 1 tablet (15 mg total) by mouth every 4 (four) hours as needed for severe pain. 60 tablet 0  . ondansetron (ZOFRAN) 8 MG tablet Take 1 tablet (8 mg total) by mouth every 8 (eight) hours as needed for nausea or vomiting. 30 tablet 1  .  prochlorperazine (COMPAZINE) 10 MG tablet Take 1 tablet (10 mg total) by mouth every 6 (six) hours as needed (Nausea or vomiting). 30 tablet 1   No current facility-administered medications for this encounter.    REVIEW OF SYSTEMS:  Notable for that above.   PHYSICAL EXAM:  weight is 108 lb 3.2 oz (49.079 kg). His oral temperature is 97.7 F (36.5 C). His blood pressure is 121/80 and his pulse is 86. His respiration is 20 and oxygen saturation is 100%.     General: Alert and oriented, in no acute distress; cachectic HEENT: Head is normocephalic.  Extraocular movements are intact. Oropharynx is notable for bulging inward of right oropharynx but no lesion. Neck: +lymphadenopathy (large right neck mass) Heart: Regular in rate and rhythm with no murmurs, rubs, or gallops. Chest: Clear to auscultation bilaterally, with no rhonchi, wheezes, or rales. Abdomen: Soft, nontender, nondistended, with no rigidity or guarding. Extremities: No cyanosis or edema. Lymphatics: see neck Skin:flaking plaques from neck  Musculoskeletal: symmetric strength and muscle tone throughout. Neurologic: Cranial nerves II through XII are grossly intact. No obvious focalities. Speech is fluent. Coordination is intact. Psychiatric: Judgment and insight are intact. Affect is appropriate.   ECOG = 2  0 - Asymptomatic (Fully active, able to carry on all predisease activities without restriction)  1 - Symptomatic but completely ambulatory (Restricted in physically strenuous activity but ambulatory and able to carry out work of a light or sedentary nature. For example, light housework, office work)  2 - Symptomatic, <50% in bed during the day (Ambulatory and capable of all self care but unable to carry out any work activities. Up and about more than 50% of waking hours)  3 - Symptomatic, >50% in bed, but not bedbound (Capable of only limited self-care, confined to bed or chair 50% or more of waking hours)  4 - Bedbound (Completely disabled. Cannot carry on any self-care. Totally confined to bed or chair)  5 - Death   Eustace Pen MM, Creech RH, Tormey DC, et al. [phone]). "Toxicity and response criteria of the [facility] [employer]". [place] Oncol. 5 (6): 649-55   LABORATORY DATA:  Lab Results  Component Value Date   WBC 2.8* 10/24/2013   HGB 9.7* 10/24/2013   HCT 29.8* 10/24/2013   MCV 111.3* 10/24/2013   PLT 97* 10/24/2013   CMP     Component Value  Date/Time   NA 138 10/24/2013 0915   K 4.1 10/24/2013 0915   CO2 30* 10/24/2013 0915   GLUCOSE 120 10/24/2013 0915   BUN 7.6 10/24/2013 0915   CREATININE 0.6* 10/24/2013 0915   CALCIUM 10.0 10/24/2013 0915   PROT 8.1 10/24/2013 0915   ALBUMIN 3.5 10/24/2013 0915   AST 27 10/24/2013 0915   ALT 18 10/24/2013 0915   ALKPHOS 63 10/24/2013 0915   BILITOT 0.38 10/24/2013 0915         RADIOGRAPHY: Nm Pet Image Restag (ps) Skull Base To Thigh  11/14/2013   CLINICAL DATA:  Subsequent treatment strategy for head and neck cancer.  EXAM: NUCLEAR MEDICINE PET SKULL BASE TO THIGH  TECHNIQUE: 5.3 mCi F-18 FDG was injected intravenously. Full-ring PET imaging was performed from the skull base to thigh after the radiotracer. CT data was obtained and used for attenuation correction and anatomic localization.  FASTING BLOOD GLUCOSE:  Value: 123 mg/dl  COMPARISON:  09/09/2013  FINDINGS: NECK  Large mass within the upper right cervical region measures 6.6 x 7.1 cm. This has an SUV  max equal to 7.7. On the previous exam this mass measured 4.9 x 5.4 cm and had an SUV max equal to 8.6.No hypermetabolic lymph nodes in the neck. The mass appears to be invading the base of skull as well as the lateral aspect of the C2 vertebra.  CHEST  Numerous hypermetabolic pulmonary nodules are again identified. Index nodule within the subpleural left lower lobe measures 1.5 cm and has an SUV max equal to 2.9. On the previous examination this nodule measured 1.3 cm and had an SUV max equal to the 2.9. Subpleural nodule within the medial right lung base measures 1.1 cm and has an SUV max equal to 3.9. On the previous exam this nodule measure 1.2 cm and had an SUV max equal to 3.1.  Decrease in FDG uptake associated with the mediastinal and right hilar lymph nodes. SUV max within the right hilar region is equal to 1.6. Previously 3.8. SUV max associated with the sub- carinal lymph nodes the equal to 2.0. Previously 3.9.  ABDOMEN/PELVIS   No abnormal hypermetabolic activity within the liver, pancreas, adrenal glands, or spleen. No hypermetabolic lymph nodes in the abdomen or pelvis.  SKELETON  New abnormal FDG uptake within the superior endplate of the C5 vertebra is identified. This has an SUV max equal to 5.3. There is new abnormal sclerosis on the corresponding CT images.  IMPRESSION: 1. Interval increase in the right upper neck mass which is extending into the base of skull and C1 vertebra. 2. No significant change in size or degree of FDG uptake associated with the index pulmonary nodules. 3. 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   Electronically Signed   By: Kerby Moors M.D.   On: 11/14/2013 10:51      IMPRESSION/PLAN: 58 yo with metastatic head and neck cancer. He is s/p systemic therapy.  He is a good candidate for palliative RT to the growing neck tumor and C5 metastasis.  It was a pleasure meeting the patient again today. We discussed the risks, benefits, and side effects of  palliative radiotherapy. Side effects may include but not necessarily be limited to: sore throat, skin irritation, change in taste, fatigue, salivary changes.  No guarantees of treatment were given. A consent form was signed and placed in the patient's medical record. I look forward to participating in the patient's care. Simulation today, start RT in ~1 week.    __________________________________________   Eppie Gibson, MD

## 2013-11-18 NOTE — Progress Notes (Signed)
Simulation, treatment planning note   Outpatient  Diagnosis:    ICD-9-CM ICD-10-CM   1. 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 An Aquaplast head and shoulder mask was custom fitted to the patient's anatomy. High-resolution CT axial imaging was obtained of the head and neck with contrast. I verified that the quality of the imaging is good for treatment planning. 3 Medically Necessary Treatment Devices were fabricated and supervised by me: Aquaplast mask and 2 fields with MLCs to block globes, oral cavity, brain, brain stem, cord.   Treatment planning note I plan to treat the patient with a wedged pair, 3D conformal planning, with DVH of globes, oral cavity, brain, brain stem, cord, TV's. I plan to treat the patient's tumor and C5 metastasis. I plan to treat to a total dose of 37.5 Gray in 15  Fractions. Dose calculation was ordered from dosimetry.      -----------------------------------  Eppie Gibson, MD

## 2013-11-19 ENCOUNTER — Encounter: Payer: Self-pay | Admitting: *Deleted

## 2013-11-19 NOTE — Progress Notes (Signed)
To provide support and encouragement, care continuity and to assess for needs, met with patient during his appt with Dr. Isidore Moos.   I initiated discussion about RT, explained RT arrival and treatment procedures, showed him an example of the mask for which he was to be fitted during [facility].  He verbalized understanding.  He denied needs at this time, understands he can contact me should that change.    Gayleen Orem, RN, BSN, [place] at [place] [phone]

## 2013-11-20 ENCOUNTER — Other Ambulatory Visit: Payer: Self-pay | Admitting: Hematology and Oncology

## 2013-11-20 DIAGNOSIS — Z510 Encounter for antineoplastic radiation therapy: Secondary | ICD-10-CM | POA: Diagnosis not present

## 2013-11-21 ENCOUNTER — Other Ambulatory Visit (HOSPITAL_BASED_OUTPATIENT_CLINIC_OR_DEPARTMENT_OTHER): Payer: BC Managed Care – PPO

## 2013-11-21 ENCOUNTER — Other Ambulatory Visit: Payer: Self-pay | Admitting: Hematology and Oncology

## 2013-11-21 ENCOUNTER — Ambulatory Visit: Payer: BC Managed Care – PPO

## 2013-11-21 ENCOUNTER — Encounter: Payer: Self-pay | Admitting: *Deleted

## 2013-11-21 ENCOUNTER — Telehealth: Payer: Self-pay | Admitting: Hematology and Oncology

## 2013-11-21 ENCOUNTER — Ambulatory Visit (HOSPITAL_BASED_OUTPATIENT_CLINIC_OR_DEPARTMENT_OTHER): Payer: BC Managed Care – PPO | Admitting: Hematology and Oncology

## 2013-11-21 VITALS — BP 127/81 | HR 96 | Temp 98.0°F | Resp 18 | Ht 73.0 in | Wt 108.9 lb

## 2013-11-21 DIAGNOSIS — T50905A Adverse effect of unspecified drugs, medicaments and biological substances, initial encounter: Secondary | ICD-10-CM

## 2013-11-21 DIAGNOSIS — R070 Pain in throat: Secondary | ICD-10-CM

## 2013-11-21 DIAGNOSIS — D6959 Other secondary thrombocytopenia: Secondary | ICD-10-CM

## 2013-11-21 DIAGNOSIS — C109 Malignant neoplasm of oropharynx, unspecified: Secondary | ICD-10-CM

## 2013-11-21 DIAGNOSIS — D630 Anemia in neoplastic disease: Secondary | ICD-10-CM

## 2013-11-21 LAB — COMPREHENSIVE METABOLIC PANEL (CC13)
ALT: 18 U/L (ref 0–55)
AST: 26 U/L (ref 5–34)
Albumin: 3.5 g/dL (ref 3.5–5.0)
Alkaline Phosphatase: 72 U/L (ref 40–150)
Anion Gap: 9 mEq/L (ref 3–11)
BUN: 9.1 mg/dL (ref 7.0–26.0)
CO2: 28 meq/L (ref 22–29)
Calcium: 9.8 mg/dL (ref 8.4–10.4)
Chloride: 101 mEq/L (ref 98–109)
Creatinine: 0.6 mg/dL — ABNORMAL LOW (ref 0.7–1.3)
Glucose: 124 mg/dl (ref 70–140)
Potassium: 5 mEq/L (ref 3.5–5.1)
SODIUM: 138 meq/L (ref 136–145)
TOTAL PROTEIN: 7.9 g/dL (ref 6.4–8.3)
Total Bilirubin: 0.21 mg/dL (ref 0.20–1.20)

## 2013-11-21 LAB — CBC WITH DIFFERENTIAL/PLATELET
BASO%: 0.9 % (ref 0.0–2.0)
Basophils Absolute: 0 10*3/uL (ref 0.0–0.1)
EOS%: 1 % (ref 0.0–7.0)
Eosinophils Absolute: 0 10*3/uL (ref 0.0–0.5)
HCT: 29.8 % — ABNORMAL LOW (ref 38.4–49.9)
HGB: 9.8 g/dL — ABNORMAL LOW (ref 13.0–17.1)
LYMPH%: 22.8 % (ref 14.0–49.0)
MCH: 40.2 pg — ABNORMAL HIGH (ref 27.2–33.4)
MCHC: 32.8 g/dL (ref 32.0–36.0)
MCV: 122.6 fL — ABNORMAL HIGH (ref 79.3–98.0)
MONO#: 0.8 10*3/uL (ref 0.1–0.9)
MONO%: 19.9 % — AB (ref 0.0–14.0)
NEUT#: 2.2 10*3/uL (ref 1.5–6.5)
NEUT%: 55.4 % (ref 39.0–75.0)
Platelets: 315 10*3/uL (ref 140–400)
RBC: 2.43 10*6/uL — AB (ref 4.20–5.82)
RDW: 20.1 % — ABNORMAL HIGH (ref 11.0–14.6)
WBC: 4 10*3/uL (ref 4.0–10.3)
lymph#: 0.9 10*3/uL (ref 0.9–3.3)

## 2013-11-21 LAB — MAGNESIUM (CC13): Magnesium: 2.4 mg/dl (ref 1.5–2.5)

## 2013-11-21 MED ORDER — MORPHINE SULFATE 30 MG PO TABS: 30.0000 mg | ORAL_TABLET | ORAL | Status: DC | PRN

## 2013-11-21 NOTE — Progress Notes (Signed)
[facility] OFFICE PROGRESS NOTE  Patient Care Team: No Pcp Per Patient as PCP - General (General Practice) Heath Lark, MD as Consulting Physician (Hematology and Oncology) Ascencion Dike, MD as Consulting Physician (Otolaryngology) Brooks Sailors, RN as Oncology Nurse Navigator (Oncology) Eppie Gibson, MD as Attending Physician (Radiation Oncology) Karie Mainland, RD as Dietitian (Nutrition)  SUMMARY OF ONCOLOGIC HISTORY: Oncology History   Oropharyngeal cancer, HPV negative   Primary site: Pharynx - Oropharynx   Staging method: AJCC 7th Edition   Clinical: Stage IVC (T4b, N3, M1) signed by Heath Lark, MD on 07/01/2013  8:32 PM   Summary: Stage IVC (T4b, N3, M1)         Oropharyngeal cancer   05/06/2013 Imaging 4.2 x 4.7 x 6.1 cm heterogeneous right neck mass in the right level 2 & level 3 stations. It invades the carotid space, obstructing the internal jugular vein. The primary lesion may be along the inferior aspect of the right palatine tonsil    05/22/2013 Procedure Laryngoscopy revealed abnormalities beneath the right tonsil area. Unfortunately tonsillectomy and right neck biopsy came back benign tissue.   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   06/24/2013 Surgery He underwent multiple extraction of tooth numbers 2, 3, 6, 8, 9, 11, 12, 13, 14, 15, 19, 20, 22, 23, 24, 25, 26, 27, and 28 along with 4 Quadrants of alveoloplasty   06/28/2013 Imaging PET/CT scan results show widespread pulmonary metastasis.   07/11/2013 - 11/07/2013 Chemotherapy The patient received palliative chemotherapy with 5-FU, carboplatin and weekly Erbitux.   09/09/2013 Imaging Repeat PET CT scan show good response to treatment.   11/14/2013 Imaging Repeat PET/CT showed disease progression    INTERVAL HISTORY: Please see below for problem oriented charting. He returns to review test results of PET scan. He complained of neck pain, not adequately  controlled. He denies recent side-effects of treatment.  REVIEW OF SYSTEMS:   Constitutional: Denies fevers, chills or abnormal weight loss Eyes: Denies blurriness of vision Ears, nose, mouth, throat, and face: Denies mucositis or sore throat Respiratory: Denies cough, dyspnea or wheezes Cardiovascular: Denies palpitation, chest discomfort or lower extremity swelling Gastrointestinal:  Denies nausea, heartburn or change in bowel habits Skin: Denies abnormal skin rashes Lymphatics: Denies new lymphadenopathy or easy bruising Neurological:Denies numbness, tingling or new weaknesses Behavioral/Psych: Mood is stable, no new changes  All other systems were reviewed with the patient and are negative.  I have reviewed the past medical history, past surgical history, social history and family history with the patient and they are unchanged from previous note.  ALLERGIES:  has No Known Allergies.  MEDICATIONS:  Current Outpatient Prescriptions  Medication Sig Dispense Refill  . docusate sodium (COLACE) 100 MG capsule Take 1 capsule (100 mg total) by mouth 2 (two) times daily. 60 capsule 0  . lidocaine-prilocaine (EMLA) cream Apply 1 application topically as needed. 30 g 6  . morphine (MSIR) 15 MG tablet Take 1 tablet (15 mg total) by mouth every 4 (four) hours as needed for severe pain. 60 tablet 0  . ondansetron (ZOFRAN) 8 MG tablet Take 1 tablet (8 mg total) by mouth every 8 (eight) hours as needed for nausea or vomiting. 30 tablet 1  . prochlorperazine (COMPAZINE) 10 MG tablet Take 1 tablet (10 mg total) by mouth every 6 (six) hours as needed (Nausea or vomiting). 30 tablet 1  . morphine (MSIR) 30 MG tablet Take 1 tablet (30 mg total) by  mouth every 4 (four) hours as needed for severe pain. 90 tablet 0   No current facility-administered medications for this visit.    PHYSICAL EXAMINATION: ECOG PERFORMANCE STATUS: 1 - Symptomatic but completely ambulatory  Filed Vitals:   11/21/13 0858   BP: 127/81  Pulse: 96  Temp: 98 F (36.7 C)  Resp: 18   Filed Weights   11/21/13 0858  Weight: 108 lb 14.4 oz (49.397 kg)    GENERAL:alert, no distress and comfortable. He appears thin and cachectic. SKIN: noted skin rash. EYES: normal, Conjunctiva are pink and non-injected, sclera clear OROPHARYNX:no exudate, no erythema and lips, buccal mucosa, and tongue normal  NECK: large mass present LYMPH:  no palpable lymphadenopathy in the cervical, axillary or inguinal LUNGS: clear to auscultation and percussion with normal breathing effort HEART: regular rate & rhythm and no murmurs and no lower extremity edema ABDOMEN:abdomen soft, non-tender and normal bowel sounds Musculoskeletal:no cyanosis of digits and no clubbing  NEURO: alert & oriented x 3 with fluent speech, no focal motor/sensory deficits  LABORATORY DATA:  I have reviewed the data as listed    Component Value Date/Time   NA 138 11/21/2013 0849   K 5.0 11/21/2013 0849   CO2 28 11/21/2013 0849   GLUCOSE 124 11/21/2013 0849   BUN 9.1 11/21/2013 0849   CREATININE 0.6* 11/21/2013 0849   CALCIUM 9.8 11/21/2013 0849   PROT 7.9 11/21/2013 0849   ALBUMIN 3.5 11/21/2013 0849   AST 26 11/21/2013 0849   ALT 18 11/21/2013 0849   ALKPHOS 72 11/21/2013 0849   BILITOT 0.21 11/21/2013 0849    No results found for: SPEP, UPEP  Lab Results  Component Value Date   WBC 4.0 11/21/2013   NEUTROABS 2.2 11/21/2013   HGB 9.8* 11/21/2013   HCT 29.8* 11/21/2013   MCV 122.6* 11/21/2013   PLT 315 11/21/2013      Chemistry      Component Value Date/Time   NA 138 11/21/2013 0849   K 5.0 11/21/2013 0849   CO2 28 11/21/2013 0849   BUN 9.1 11/21/2013 0849   CREATININE 0.6* 11/21/2013 0849      Component Value Date/Time   CALCIUM 9.8 11/21/2013 0849   ALKPHOS 72 11/21/2013 0849   AST 26 11/21/2013 0849   ALT 18 11/21/2013 0849   BILITOT 0.21 11/21/2013 0849      ASSESSMENT & PLAN:  Oropharyngeal cancer Unfortunately,  imaging scans showed disease progression. I recommend discontinuation of chemotherapy and referred him to get palliative radiation therapy to his neck. I will continue to follow closely for supportive care and pain management. We discussed poor prognosis and recommended palliative care consult  Anemia in neoplastic disease This is likely due to recent treatment. The patient denies recent history of bleeding such as epistaxis, hematuria or hematochezia. He is asymptomatic from the anemia. I will observe for now.  He does not require transfusion now. I will continue the chemotherapy at current dose without dosage adjustment.  If the anemia gets progressive worse in the future, I might have to delay his treatment or adjust the chemotherapy dose.   Throat pain I refilled his prescription morphine sulfate. I increased his dose for better pain control We discussed about narcotic refill policy.    Thrombocytopenia due to drugs This is likely due to recent treatment. The patient denies recent history of bleeding such as epistaxis, hematuria or hematochezia. He is asymptomatic from the low platelet count. I will observe for now.  he does  not require transfusion now. I will continue the chemotherapy at current dose without dosage adjustment.  If the thrombocytopenia gets progressive worse in the future, I might have to delay his treatment or adjust the chemotherapy dose.   No orders of the defined types were placed in this encounter.   All questions were answered. The patient knows to call the clinic with any problems, questions or concerns. No barriers to learning was detected. I spent 30 minutes counseling the patient face to face. The total time spent in the appointment was 40 minutes and more than 50% was on counseling and review of test results     [facility], [place], MD 11/21/2013 8:40 PM

## 2013-11-21 NOTE — Assessment & Plan Note (Signed)
I refilled his prescription morphine sulfate. I increased his dose for better pain control We discussed about narcotic refill policy.

## 2013-11-21 NOTE — Assessment & Plan Note (Signed)
This is likely due to recent treatment. The patient denies recent history of bleeding such as epistaxis, hematuria or hematochezia. He is asymptomatic from the low platelet count. I will observe for now.  he does not require transfusion now. I will continue the chemotherapy at current dose without dosage adjustment.  If the thrombocytopenia gets progressive worse in the future, I might have to delay his treatment or adjust the chemotherapy dose.   

## 2013-11-21 NOTE — Assessment & Plan Note (Signed)
Unfortunately, imaging scans showed disease progression. I recommend discontinuation of chemotherapy and referred him to get palliative radiation therapy to his neck. I will continue to follow closely for supportive care and pain management. We discussed poor prognosis and recommended palliative care consult

## 2013-11-21 NOTE — Assessment & Plan Note (Signed)
This is likely due to recent treatment. The patient denies recent history of bleeding such as epistaxis, hematuria or hematochezia. He is asymptomatic from the anemia. I will observe for now.  He does not require transfusion now. I will continue the chemotherapy at current dose without dosage adjustment.  If the anemia gets progressive worse in the future, I might have to delay his treatment or adjust the chemotherapy dose.  

## 2013-11-21 NOTE — Telephone Encounter (Signed)
gv adn printed appt sched adn avs for pt for NOV and Dec

## 2013-11-21 NOTE — Progress Notes (Signed)
To provide support and encouragement, care continuity and to assess for needs, met with patient during appt with Dr. Alvy Bimler.  In the context of Dr. Calton Dach discussion of PET results, the prospect of ongoing chemotherapy after several weeks of RT, and his overall prognosis.  I offered for his consideration a consult with Wadie Lessen, NP, Palliative Medicine Team.  He expressed understanding of my explanation of palliative care, the appropriateness of conversation with her.  He expressed willingness to meet; I explained that I will arrange this appt on his behalf.  Drs. Isidore Moos and [place] informed.  Gayleen Orem, RN, BSN, [place] at [place] [phone]

## 2013-11-24 ENCOUNTER — Other Ambulatory Visit: Payer: Self-pay | Admitting: Radiation Oncology

## 2013-11-24 DIAGNOSIS — C01 Malignant neoplasm of base of tongue: Secondary | ICD-10-CM

## 2013-11-25 ENCOUNTER — Encounter: Payer: Self-pay | Admitting: *Deleted

## 2013-11-25 ENCOUNTER — Ambulatory Visit
Admission: RE | Admit: 2013-11-25 | Discharge: 2013-11-25 | Disposition: A | Discharge status: Home / Self Care | Payer: BC Managed Care – PPO | Source: Ambulatory Visit | Attending: Radiation Oncology | Admitting: Radiation Oncology

## 2013-11-25 DIAGNOSIS — Z510 Encounter for antineoplastic radiation therapy: Secondary | ICD-10-CM | POA: Diagnosis not present

## 2013-11-25 DIAGNOSIS — C01 Malignant neoplasm of base of tongue: Secondary | ICD-10-CM

## 2013-11-25 NOTE — Progress Notes (Signed)
To provide support and encouragement, care continuity and to assess for needs, met with patient during Port and Treat.  He reported some anxiety with procedures, understands that as tmts progress he will become more comfortable with procedure.  He asked that upcoming appts that are scheduled into the afternoon be re-scheduled for morning if possible; I relayed this request to therapists.  I indicated that Dr. Squire is placing a Palliative Care consult order, that he should be able to meet with Mary Larach next Monday after his tmt.  He verbalized understanding.  Rick Diehl, RN, BSN, CHPN Head & Neck Oncology Navigator [facility] at [place] [phone]  

## 2013-11-25 NOTE — Progress Notes (Signed)
Simulation Verification Note   ICD-9-CM ICD-10-CM   1. 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 The clinical setup was verified. Then port films were obtained and uploaded to the radiation oncology medical record software.  The treatment beams were carefully compared against the planned radiation fields. The position location and shape of the radiation fields was reviewed. They targeted volume of tissue appears to be appropriately covered by the radiation beams. Organs at risk appear to be excluded as planned.  Based on my personal review, I approved the simulation verification. The patient's treatment will proceed as planned.  -----------------------------------  Eppie Gibson, MD

## 2013-11-26 ENCOUNTER — Ambulatory Visit
Admission: RE | Admit: 2013-11-26 | Discharge: 2013-11-26 | Disposition: A | Discharge status: Home / Self Care | Payer: BC Managed Care – PPO | Source: Ambulatory Visit | Attending: Radiation Oncology | Admitting: Radiation Oncology

## 2013-11-26 DIAGNOSIS — Z510 Encounter for antineoplastic radiation therapy: Secondary | ICD-10-CM | POA: Diagnosis not present

## 2013-11-27 ENCOUNTER — Ambulatory Visit
Admission: RE | Admit: 2013-11-27 | Discharge: 2013-11-27 | Disposition: A | Discharge status: Home / Self Care | Payer: BC Managed Care – PPO | Source: Ambulatory Visit | Attending: Radiation Oncology | Admitting: Radiation Oncology

## 2013-11-27 DIAGNOSIS — Z510 Encounter for antineoplastic radiation therapy: Secondary | ICD-10-CM | POA: Diagnosis not present

## 2013-11-28 ENCOUNTER — Ambulatory Visit
Admission: RE | Admit: 2013-11-28 | Discharge: 2013-11-28 | Disposition: A | Discharge status: Home / Self Care | Payer: BC Managed Care – PPO | Source: Ambulatory Visit | Attending: Radiation Oncology | Admitting: Radiation Oncology

## 2013-11-28 DIAGNOSIS — Z510 Encounter for antineoplastic radiation therapy: Secondary | ICD-10-CM | POA: Diagnosis not present

## 2013-11-29 ENCOUNTER — Ambulatory Visit
Admission: RE | Admit: 2013-11-29 | Discharge: 2013-11-29 | Disposition: A | Discharge status: Home / Self Care | Payer: BC Managed Care – PPO | Source: Ambulatory Visit | Attending: Radiation Oncology | Admitting: Radiation Oncology

## 2013-11-29 DIAGNOSIS — Z510 Encounter for antineoplastic radiation therapy: Secondary | ICD-10-CM | POA: Diagnosis not present

## 2013-12-02 ENCOUNTER — Ambulatory Visit
Admission: RE | Admit: 2013-12-02 | Discharge: 2013-12-02 | Disposition: A | Discharge status: Home / Self Care | Payer: BC Managed Care – PPO | Source: Ambulatory Visit | Attending: Radiation Oncology | Admitting: Radiation Oncology

## 2013-12-02 ENCOUNTER — Encounter: Payer: Self-pay | Admitting: Radiation Oncology

## 2013-12-02 VITALS — BP 146/68 | HR 78 | Temp 97.7°F | Resp 12 | Wt 107.8 lb

## 2013-12-02 DIAGNOSIS — C01 Malignant neoplasm of base of tongue: Secondary | ICD-10-CM | POA: Insufficient documentation

## 2013-12-02 DIAGNOSIS — Z510 Encounter for antineoplastic radiation therapy: Secondary | ICD-10-CM | POA: Diagnosis present

## 2013-12-02 MED ORDER — BIAFINE EX EMUL
Freq: Two times a day (BID) | CUTANEOUS | Status: DC
  Administered 2013-12-02: 16:00:00 via TOPICAL

## 2013-12-02 NOTE — Progress Notes (Signed)
He rates his pain as a 2 on a scale of 0-10. Pt complains of, Fatigue, Pain is described as "painful sore in jaw" and Pain Occurs  Intermittently. PERRLA Pt denies dysphagia, reports large boluses of food are difficult for him. PO Diet: Regular,softer. Drinks 6 cans of Boost a day. Oral exam reveals moist mucus membranes with a large amount of yellow exudate on tongue, Pt reports he has a lot of thick saliva, that he reports as annoying. Skin erythema - neck/left sided. Noted crusty gray areas over left side of scalp, neck.

## 2013-12-02 NOTE — Progress Notes (Signed)
   Weekly Management Note:  outpatient    ICD-9-CM ICD-10-CM   1. 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  CBCT/MVCT images/Port film x-rays were reviewed.  The chart was checked. No new complaints. Morphine for pain. Persistent thick saliva. No soreness in throat  Physical Findings:  weight is 107 lb 12.8 oz (48.898 kg). His oral temperature is 97.7 F (36.5 C). His blood pressure is 146/68 and his pulse is 78. His respiration is 12 and oxygen saturation is 98%.  NAD. Oral mucosa without mucositis; but saliva is thick. Large right neck mass. Flaky skin over neck, stable.  Impression:  The patient is tolerating radiotherapy.  Plan:  Continue radiotherapy as planned. Baking soda gargle recipe given.  ________________________________   Eppie Gibson, M.D.

## 2013-12-02 NOTE — Addendum Note (Signed)
Encounter addended by: Jenene Slicker, RN on: 12/02/2013  4:15 PM<BR>     Documentation filed: Orders, Dx Association, Inpatient [facility]

## 2013-12-02 NOTE — Addendum Note (Signed)
Encounter addended by: Jenene Slicker, RN on: 12/02/2013  4:02 PM<BR>     Documentation filed: Notes Section

## 2013-12-02 NOTE — Progress Notes (Signed)
Pt here for Pt teaching.  Pt given Radiation and You book and flagged/reviewed areas of pertinence.  Reviewed fatigue, skin, throat, swallowing changes.  Encouraged pt to avoid using fragrance soaps, lotion, avoid shaving with straight razor, eating easy to eat foods.  As well as how to manage fatigue.  Pt given Biafine given with instructions to apply bid and to avoid applying within 4 hrs of treatment.  Pt reports understanding and states they feel comfortable with information given.  Pt able to give teach back of how frequently to apply Biafine, to use an electric razor if shaving, to eat high protein, easy to eat foods.  Encouraged to call nursing for any questions of concerns.

## 2013-12-03 ENCOUNTER — Ambulatory Visit (HOSPITAL_BASED_OUTPATIENT_CLINIC_OR_DEPARTMENT_OTHER)
Admission: RE | Admit: 2013-12-03 | Discharge: 2013-12-03 | Disposition: A | Discharge status: Home / Self Care | Payer: BC Managed Care – PPO | Source: Ambulatory Visit | Attending: Radiation Oncology | Admitting: Radiation Oncology

## 2013-12-03 ENCOUNTER — Ambulatory Visit
Admission: RE | Admit: 2013-12-03 | Discharge: 2013-12-03 | Disposition: A | Discharge status: Home / Self Care | Payer: BC Managed Care – PPO | Source: Ambulatory Visit | Attending: Radiation Oncology | Admitting: Radiation Oncology

## 2013-12-03 DIAGNOSIS — Z7189 Other specified counseling: Secondary | ICD-10-CM

## 2013-12-03 DIAGNOSIS — R531 Weakness: Secondary | ICD-10-CM

## 2013-12-03 DIAGNOSIS — Z510 Encounter for antineoplastic radiation therapy: Secondary | ICD-10-CM | POA: Diagnosis not present

## 2013-12-04 ENCOUNTER — Telehealth: Payer: Self-pay | Admitting: Hematology and Oncology

## 2013-12-04 ENCOUNTER — Ambulatory Visit
Admission: RE | Admit: 2013-12-04 | Discharge: 2013-12-04 | Disposition: A | Discharge status: Home / Self Care | Payer: BC Managed Care – PPO | Source: Ambulatory Visit | Attending: Radiation Oncology | Admitting: Radiation Oncology

## 2013-12-04 DIAGNOSIS — Z510 Encounter for antineoplastic radiation therapy: Secondary | ICD-10-CM | POA: Diagnosis not present

## 2013-12-04 NOTE — Telephone Encounter (Signed)
LM to confirm new time for 12/05/13.

## 2013-12-05 ENCOUNTER — Ambulatory Visit
Admission: RE | Admit: 2013-12-05 | Discharge: 2013-12-05 | Disposition: A | Discharge status: Home / Self Care | Payer: BC Managed Care – PPO | Source: Ambulatory Visit | Attending: Radiation Oncology | Admitting: Radiation Oncology

## 2013-12-05 ENCOUNTER — Encounter: Payer: Self-pay | Admitting: Hematology and Oncology

## 2013-12-05 ENCOUNTER — Telehealth: Payer: Self-pay | Admitting: Hematology and Oncology

## 2013-12-05 ENCOUNTER — Encounter: Payer: Self-pay | Admitting: *Deleted

## 2013-12-05 ENCOUNTER — Ambulatory Visit (HOSPITAL_BASED_OUTPATIENT_CLINIC_OR_DEPARTMENT_OTHER): Payer: BC Managed Care – PPO | Admitting: Hematology and Oncology

## 2013-12-05 VITALS — BP 122/107 | HR 104 | Temp 98.1°F | Resp 18 | Ht 73.0 in | Wt 108.1 lb

## 2013-12-05 DIAGNOSIS — C109 Malignant neoplasm of oropharynx, unspecified: Secondary | ICD-10-CM

## 2013-12-05 DIAGNOSIS — Z510 Encounter for antineoplastic radiation therapy: Secondary | ICD-10-CM | POA: Diagnosis not present

## 2013-12-05 DIAGNOSIS — R070 Pain in throat: Secondary | ICD-10-CM

## 2013-12-05 DIAGNOSIS — K1231 Oral mucositis (ulcerative) due to antineoplastic therapy: Secondary | ICD-10-CM

## 2013-12-05 DIAGNOSIS — R634 Abnormal weight loss: Secondary | ICD-10-CM

## 2013-12-05 MED ORDER — FIRST-BXN MOUTHWASH MT SUSP: 5.0000 mL | Freq: Four times a day (QID) | OROMUCOSAL | Status: DC

## 2013-12-05 MED ORDER — MORPHINE SULFATE 30 MG PO TABS: 30.0000 mg | ORAL_TABLET | ORAL | Status: DC | PRN

## 2013-12-05 NOTE — Progress Notes (Signed)
10:35 am Call received from Aaron Edelman with [facility] for [facility] order clarification. 10:53 am verbal order received and read back from Dr. Alvy Bimler for patient to have Diphenhydramine, Lidocaine, Nystatin and Maalox in a 1:1:1:1 concentration.   10:55 Loleta Dicker and this order given.

## 2013-12-05 NOTE — Assessment & Plan Note (Signed)
He looks cachectic. Continue to encourage oral intake. He will continue close follow-up with dietitian. 

## 2013-12-05 NOTE — Assessment & Plan Note (Signed)
He is doing well on palliative radiation treatment. I have refilled his prescription and continue supportive care.

## 2013-12-05 NOTE — Assessment & Plan Note (Signed)
I refilled his prescription morphine sulfate. He is doing better with increased dose. We discussed about narcotic refill policy.  

## 2013-12-05 NOTE — Telephone Encounter (Signed)
Gave avs & cal for Dec. °

## 2013-12-05 NOTE — Progress Notes (Signed)
[place] OFFICE PROGRESS NOTE  Patient Care Team: No Pcp Per Patient as PCP - General (General Practice) Heath Lark, MD as Consulting Physician (Hematology and Oncology) Ascencion Dike, MD as Consulting Physician (Otolaryngology) Brooks Sailors, RN as Oncology Nurse Navigator (Oncology) Eppie Gibson, MD as Attending Physician (Radiation Oncology) Karie Mainland, RD as Dietitian (Nutrition)  SUMMARY OF ONCOLOGIC HISTORY: Oncology History   Oropharyngeal cancer, HPV negative   Primary site: Pharynx - Oropharynx   Staging method: AJCC 7th Edition   Clinical: Stage IVC (T4b, N3, M1) signed by Heath Lark, MD on 07/01/2013  8:32 PM   Summary: Stage IVC (T4b, N3, M1)         Oropharyngeal cancer   05/06/2013 Imaging 4.2 x 4.7 x 6.1 cm heterogeneous right neck mass in the right level 2 & level 3 stations. It invades the carotid space, obstructing the internal jugular vein. The primary lesion may be along the inferior aspect of the right palatine tonsil    05/22/2013 Procedure Laryngoscopy revealed abnormalities beneath the right tonsil area. Unfortunately tonsillectomy and right neck biopsy came back benign tissue.   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   06/24/2013 Surgery He underwent multiple extraction of tooth numbers 2, 3, 6, 8, 9, 11, 12, 13, 14, 15, 19, 20, 22, 23, 24, 25, 26, 27, and 28 along with 4 Quadrants of alveoloplasty   06/28/2013 Imaging PET/CT scan results show widespread pulmonary metastasis.   07/11/2013 - 11/07/2013 Chemotherapy The patient received palliative chemotherapy with 5-FU, carboplatin and weekly Erbitux.   09/09/2013 Imaging Repeat PET CT scan show good response to treatment.   11/14/2013 Imaging Repeat PET/CT showed disease progression    INTERVAL HISTORY: Please see below for problem oriented charting. He is receiving palliative radiation treatment. Since I have increased the dose of his prescription,  he is feeling better. Denies any swallowing difficulties. He had mild sore throat. Has mild mucositis.  REVIEW OF SYSTEMS:   Constitutional: Denies fevers, chills or abnormal weight loss Eyes: Denies blurriness of vision Respiratory: Denies cough, dyspnea or wheezes Cardiovascular: Denies palpitation, chest discomfort or lower extremity swelling Gastrointestinal:  Denies nausea, heartburn or change in bowel habits Skin: Denies abnormal skin rashes Lymphatics: Denies new lymphadenopathy or easy bruising Neurological:Denies numbness, tingling or new weaknesses Behavioral/Psych: Mood is stable, no new changes  All other systems were reviewed with the patient and are negative.  I have reviewed the past medical history, past surgical history, social history and family history with the patient and they are unchanged from previous note.  ALLERGIES:  has No Known Allergies.  MEDICATIONS:  Current Outpatient Prescriptions  Medication Sig Dispense Refill  . Diphenhyd-Lidocaine-Nystatin (FIRST-BXN MOUTHWASH) SUSP Take 5 mLs by mouth 4 (four) times daily. (Patient taking differently: Take 5 mLs by mouth 4 (four) times daily. Verbal order from Dr. Alvy Bimler for these ingredients plus Maalox in a 1:1:1:1 concentration.  Called to [facility] for Quantity Sufficient.) 1 Bottle 2  . docusate sodium (COLACE) 100 MG capsule Take 1 capsule (100 mg total) by mouth 2 (two) times daily. 60 capsule 0  . lidocaine-prilocaine (EMLA) cream Apply 1 application topically as needed. 30 g 6  . morphine (MSIR) 15 MG tablet   0  . morphine (MSIR) 30 MG tablet Take 1 tablet (30 mg total) by mouth every 4 (four) hours as needed for severe pain. 90 tablet 0   No current facility-administered medications for this visit.  PHYSICAL EXAMINATION: ECOG PERFORMANCE STATUS: 1 - Symptomatic but completely ambulatory  Filed Vitals:   12/05/13 0915  BP: 122/107  Pulse: 104  Temp: 98.1 F (36.7 C)  Resp: 18    Filed Weights   12/05/13 0915  Weight: 108 lb 1.6 oz (49.034 kg)    GENERAL:alert, no distress and comfortable. He looks thin and cachectic with temporal muscle wasting SKIN: skin color, texture, turgor are normal, no rashes or significant lesions. Noted the mild radiation induced skin dermatitis EYES: normal, Conjunctiva are pink and non-injected, sclera clear OROPHARYNX:no exudate, no erythema and lips, buccal mucosa, and tongue normal . Mild mucositis, no thrush.  NECK: The neck mass is slightly bigger than previous exam. Mild tender to touch. LYMPH:  no palpable lymphadenopathy in the cervical, axillary or inguinal LUNGS: clear to auscultation and percussion with normal breathing effort HEART: regular rate & rhythm and no murmurs and no lower extremity edema ABDOMEN:abdomen soft, non-tender and normal bowel sounds Musculoskeletal:no cyanosis of digits and no clubbing  NEURO: alert & oriented x 3 with fluent speech, no focal motor/sensory deficits  LABORATORY DATA:  I have reviewed the data as listed    Component Value Date/Time   NA 138 11/21/2013 0849   K 5.0 11/21/2013 0849   CO2 28 11/21/2013 0849   GLUCOSE 124 11/21/2013 0849   BUN 9.1 11/21/2013 0849   CREATININE 0.6* 11/21/2013 0849   CALCIUM 9.8 11/21/2013 0849   PROT 7.9 11/21/2013 0849   ALBUMIN 3.5 11/21/2013 0849   AST 26 11/21/2013 0849   ALT 18 11/21/2013 0849   ALKPHOS 72 11/21/2013 0849   BILITOT 0.21 11/21/2013 0849    No results found for: SPEP, UPEP  Lab Results  Component Value Date   WBC 4.0 11/21/2013   NEUTROABS 2.2 11/21/2013   HGB 9.8* 11/21/2013   HCT 29.8* 11/21/2013   MCV 122.6* 11/21/2013   PLT 315 11/21/2013      Chemistry      Component Value Date/Time   NA 138 11/21/2013 0849   K 5.0 11/21/2013 0849   CO2 28 11/21/2013 0849   BUN 9.1 11/21/2013 0849   CREATININE 0.6* 11/21/2013 0849      Component Value Date/Time   CALCIUM 9.8 11/21/2013 0849   ALKPHOS 72 11/21/2013  0849   AST 26 11/21/2013 0849   ALT 18 11/21/2013 0849   BILITOT 0.21 11/21/2013 0849     ASSESSMENT & PLAN:  Oropharyngeal cancer He is doing well on palliative radiation treatment. I have refilled his prescription and continue supportive care.  Throat pain I refilled his prescription morphine sulfate. He is doing better with increased dose. We discussed about narcotic refill policy.      Weight loss He looks cachectic. Continue to encourage oral intake. He will continue close follow-up with dietitian.  Mucositis due to antineoplastic therapy I refill prescription Magic mouthwash. He will use pain medicine as needed.    All questions were answered. The patient knows to call the clinic with any problems, questions or concerns. No barriers to learning was detected. I spent 25 minutes counseling the patient face to face. The total time spent in the appointment was 30 minutes and more than 50% was on counseling and review of test results     [facility], [place], MD 12/05/2013 1:44 PM

## 2013-12-05 NOTE — Assessment & Plan Note (Signed)
I refill prescription Magic mouthwash. He will use pain medicine as needed.

## 2013-12-06 ENCOUNTER — Encounter: Payer: Self-pay | Admitting: Radiation Oncology

## 2013-12-06 ENCOUNTER — Ambulatory Visit
Admission: RE | Admit: 2013-12-06 | Discharge: 2013-12-06 | Disposition: A | Discharge status: Home / Self Care | Payer: BC Managed Care – PPO | Source: Ambulatory Visit | Attending: Radiation Oncology | Admitting: Radiation Oncology

## 2013-12-06 ENCOUNTER — Encounter: Payer: Self-pay | Admitting: *Deleted

## 2013-12-06 VITALS — BP 130/82 | HR 101 | Temp 98.2°F | Wt 109.2 lb

## 2013-12-06 DIAGNOSIS — Z510 Encounter for antineoplastic radiation therapy: Secondary | ICD-10-CM | POA: Diagnosis not present

## 2013-12-06 DIAGNOSIS — C01 Malignant neoplasm of base of tongue: Secondary | ICD-10-CM

## 2013-12-06 NOTE — Progress Notes (Signed)
   Weekly Management Note:  outpatient    ICD-9-CM ICD-10-CM   1. 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  CBCT/MVCT images/Port film x-rays were reviewed.  MMW and Morphine for throat pain. Persistent thick saliva but hasn't used baking soda gargle, yet  Physical Findings:  weight is 109 lb 3.2 oz (49.533 kg). His temperature is 98.2 F (36.8 C). His blood pressure is 130/82 and his pulse is 101. His oxygen saturation is 98%.  NAD. Oral mucosa without mucositis; but saliva is thick. Large right neck mass. Flaky skin over neck, stable.  Impression:  The patient is tolerating radiotherapy.  Plan:  Continue radiotherapy as planned. Baking soda gargles encouraged.  Sees palliative care today. ________________________________   Eppie Gibson, M.D.

## 2013-12-06 NOTE — Progress Notes (Signed)
To provide support and encouragement, care continuity and to assess for needs, met with patient during weekly UT with Dr. Isidore Moos and during palliative care consult with Wadie Lessen, NP.  I contributed to discussion of documenting health care wishes on MOST form, importance of documenting a HCPOA.  I reeducated [place] on SW availability for assistance with Living Will and HCPOA.  He verbalized understanding.  He understands he can call [facility] or me with questions.  Gayleen Orem, RN, BSN, [place] at [place] [phone]

## 2013-12-06 NOTE — Progress Notes (Signed)
Mr. Elms has received 9 fractions to his neck.  He reports that his pain on swallowing has not changed and he grades this a a level 7/10.  Eating soft foods. Gained 2 lbs since 12/02/13.  Skin on neck intact with crusting.

## 2013-12-09 ENCOUNTER — Ambulatory Visit
Admission: RE | Admit: 2013-12-09 | Discharge: 2013-12-09 | Disposition: A | Discharge status: Home / Self Care | Payer: BC Managed Care – PPO | Source: Ambulatory Visit | Attending: Radiation Oncology | Admitting: Radiation Oncology

## 2013-12-09 ENCOUNTER — Encounter: Payer: Self-pay | Admitting: Radiation Oncology

## 2013-12-09 VITALS — BP 144/82 | HR 90 | Temp 97.5°F | Ht 73.0 in | Wt 110.5 lb

## 2013-12-09 DIAGNOSIS — Z510 Encounter for antineoplastic radiation therapy: Secondary | ICD-10-CM | POA: Diagnosis not present

## 2013-12-09 DIAGNOSIS — C01 Malignant neoplasm of base of tongue: Secondary | ICD-10-CM

## 2013-12-09 NOTE — Progress Notes (Signed)
Derek Morgan has received 10 fractions to his right neck.  He states that it is "difficult to get food down and today he grades his discomfort in swallowing as a level 5/10.  The nodule area on his neck is intact and unchanged since Friday of last week. Note thickened saliva in oral cavity. Oral mucosa light pink and intact. Using Biafine in the treatment area.

## 2013-12-09 NOTE — Progress Notes (Signed)
   Weekly Management Note:  outpatient    ICD-9-CM ICD-10-CM   1. 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  CBCT/MVCT images/Port film x-rays were reviewed.  The chart was checked. Mr. Begley has received 10 fractions to his right neck. Baking soda mouth washes/gargle help the thickened saliva in oral cavity.   Using Biafine in the treatment area. Soreness in throat is similar to last week.  Morphine for neck pain. Wt up 1 lb  Physical Findings:  height is 6\' 1"  (1.854 m) and weight is 110 lb 8 oz (50.122 kg). His temperature is 97.5 F (36.4 C). His blood pressure is 144/82 and his pulse is 90.  exam unchanged.  Persistent right upper neck, node, no thrush, + thick saliva.  Impression:  The patient is tolerating radiotherapy.  Plan:  Continue radiotherapy as planned.   ________________________________   Eppie Gibson, M.D.

## 2013-12-10 ENCOUNTER — Ambulatory Visit
Admission: RE | Admit: 2013-12-10 | Discharge: 2013-12-10 | Disposition: A | Discharge status: Home / Self Care | Payer: BC Managed Care – PPO | Source: Ambulatory Visit | Attending: Radiation Oncology | Admitting: Radiation Oncology

## 2013-12-10 DIAGNOSIS — Z510 Encounter for antineoplastic radiation therapy: Secondary | ICD-10-CM | POA: Diagnosis not present

## 2013-12-10 DIAGNOSIS — R531 Weakness: Secondary | ICD-10-CM

## 2013-12-10 DIAGNOSIS — Z7189 Other specified counseling: Secondary | ICD-10-CM

## 2013-12-11 ENCOUNTER — Ambulatory Visit
Admission: RE | Admit: 2013-12-11 | Discharge: 2013-12-11 | Disposition: A | Discharge status: Home / Self Care | Payer: BC Managed Care – PPO | Source: Ambulatory Visit | Attending: Radiation Oncology | Admitting: Radiation Oncology

## 2013-12-11 DIAGNOSIS — Z510 Encounter for antineoplastic radiation therapy: Secondary | ICD-10-CM | POA: Diagnosis not present

## 2013-12-13 ENCOUNTER — Ambulatory Visit: Payer: BC Managed Care – PPO

## 2013-12-16 ENCOUNTER — Ambulatory Visit
Admission: RE | Admit: 2013-12-16 | Discharge: 2013-12-16 | Disposition: A | Discharge status: Home / Self Care | Payer: BC Managed Care – PPO | Source: Ambulatory Visit | Attending: Radiation Oncology | Admitting: Radiation Oncology

## 2013-12-16 VITALS — BP 116/68 | HR 90 | Temp 97.7°F | Ht 73.0 in | Wt 113.3 lb

## 2013-12-16 DIAGNOSIS — Z510 Encounter for antineoplastic radiation therapy: Secondary | ICD-10-CM | POA: Diagnosis not present

## 2013-12-16 DIAGNOSIS — C01 Malignant neoplasm of base of tongue: Secondary | ICD-10-CM

## 2013-12-16 NOTE — Consult Note (Signed)
Patient Derek Morgan      DOB: Mar 17, 1955      ZSW:[id number]     Consult Note from the Palliative Medicine Team at [place] Requested by:  Dr Isidore Moos     PCP: No PCP Per Patient Reason for Consultation:Introduction to Palliative Medicine Team           Phone                                                                                                                                Number:None  Assessment of patients Current state:   Continued physical and functional decline  2/2 to metastatic squamous cell oral-pharyngeal cancer.  Consult is for introduction to the concept of Palliative Medicine;clarification of Advanced Directives,  holistic support and symptom management as indicated  This NP Wadie Lessen reviewed medical records, received report from team, assessed the patient and then meet with  Derek Morgan in  the outpatient oncology clinic.  A detailed discussion was had today regarding advanced directives.  Concepts specific to code status, artifical feeding and hydration, continued IV antibiotics and rehospitalization was had.  The difference between a aggressive medical intervention path  and a palliative comfort care path for this patient at this time was had.  Values and goals of care important to patient and family were attempted to be elicited.  Pateint tells me his friend Jaci Standard # [phone] is his main support person, I encouraged him to talk with his friend and see if he would consider documenting HPOA.  Concept of Hospice and Palliative Care were discussed  Natural trajectory and expectations at EOL were discussed.  Questions and concerns addressed.  Hard Choices booklet left for review. Family encouraged to call with questions or concerns.  PMT will continue to support holistically.    Curriculum Parameters Description of the Parameter Patient's Response  C all it what it is  Please give me a summary of what the doctors have told you about your illness.  Try to call it by name, and summarize what you heard the doctor say about where are you going next. Example: I have cancer XYZ. It is located XYZ . We are going to start chemo/radiation on XYZ.Marland Kitchen.  " I have cancer and they can't cure it" "I will be on chemo the rest of my life"  O ptimize and Organize List your current symptoms i.e pain, constipation, depression, anger, nausea, insomnia, vomiting diarrhea. Supportive and palliative- care is all about optimizing your symptom management.    Start to organize your thoughts, your calendar, your important documents. Who would you want to help you with this journey? Who would you trust to make decisions for you if you could not make them yourself (your surrogate)?  fatigue  P lanning Start working on your advanced directives, your living will and your code status with the help of the social worker and your oncologist. It is  never to early to have these in order so you can enjoy each moment.   Consider working on your future goals for if your treatments are not going the way you want them to. Take charge with the help and guidance from your oncologist!     What else do you want to plan? A birthday, anniversary, a trip? Strike while the iron is hot. Set one goal each day. -no documented AD/HPOA-limited social support  E nergize What energizes and gets you up in the morning?   How is your family supporting you? How are they holding up? Would it be helpful to talk with our social worker about things related to your care and care givers?   What hobbies do you have and how can we creatively adjust them to meet your physical needs at this time? -he continues to work and finds purpose in working  -enjoys his     car           Goals of Care: 1.  Code Status:   Full code-      strongly encouraged patient to consider a DNR/DNI due to his progression of disease and anatomical s/p surgical changes and unlikely desired outcomes   2. Scope of  Treatment:  At this time patietn is open to all available and offered medcial interventions to prolong quality life.   4. Symptom Management:   1. Fatigue: -Pace yourself -Plan your day -Include naps and breaks -schedule a relaxing day -get a little exercise -fuel the body -consider complementary therapies   -deep breathing   -prayer/medication   -guided meditation  2. Pain:  Managed by oncology, patient reports satisfaction with pain control           Morphine IR 30 mg every 4 hrs prn   3. Bowel Regimen:  Discussed/educated side effects of opioids, recommended Senna-s 1-2 tablets q hs prn  5. Psychosocial:  Emotional support offered to patient today.  He tends to a loner and today he is in a hurry to leave the office and "get back to work".  He is encouraged to call with questions or concerns    Patient Documents Completed or Given: Document Given Completed  Advanced Directives Pkt    MOST X   DNR    Gone from My Sight    Hard Choices      Brief HPI:     Oropharyngeal cancer, HPV negative  05-06-2013 Tooth extraction, MRI/PET show widespread pulmonary metastasis   06-24-2013 Palliative radiation continues with disease progression 11-14-13 Continued physical and functional decline.  ROS: fatigue   PMH:  Past Medical History  Diagnosis Date  . Heavy smoker   . Hoarseness of voice   . Mass in neck 04/2013    right/ Metastatic Squamous Cell Carcinoma  . Loose, teeth 06/24/2013  . Weight loss 06/17/2013  . Throat pain 06/17/2013  . Shortness of breath     with exertion at times  . Cancer     oral, pharyngeal     PSH: Past Surgical History  Procedure Laterality Date  . Broken leg  1998    orif ankle-left-plated  . Direct laryngoscopy N/A 05/22/2013    Procedure: DIRECT LARYNGOSCOPY;  Surgeon: Ascencion Dike, MD;  Location: [facility] OR;  Service: ENT;  Laterality: N/A;  . Tonsillectomy Right 05/22/2013    Procedure: RIGHT TONSILLECTOMY;  Surgeon: Ascencion Dike, MD;  Location:  [place];  Service: ENT;  Laterality: Right;  . Mass biopsy Right  05/22/2013    Procedure:  INCISIONAL BIOPSY NECK MASS;  Surgeon: Ascencion Dike, MD;  Location: [place];  Service: ENT;  Laterality: Right;  . Mass biopsy Right 06/07/2013    Procedure: EXCISIONAL BIOPSY OF RIGHT NECK MASS;  Surgeon: Ascencion Dike, MD;  Location: [place];  Service: ENT;  Laterality: Right;  . Multiple extractions with alveoloplasty N/A 06/24/2013    Procedure: Extraction of tooth #'s 2,3,6,8,9,11,12,13,14,15,19,20,22,23,24,25,26,27,28 with alveoloplasty.;  Surgeon: Lenn Cal, DDS;  Location: [place];  Service: Oral Surgery;  Laterality: N/A;  . Portacath placement Left 07/04/2013    Procedure: INSERTION PORT-A-CATH;  Surgeon: Merrie Roof, MD;  Location: [place];  Service: General;  Laterality: Left;   I have reviewed the [place] and SH and  If appropriate update it with new information. No Known Allergies Scheduled Meds: Continuous Infusions: PRN Meds:.      There were no vitals taken for this visit.    No intake or output data in the 24 hours ending 12/16/13 0726  Physical Exam:  General: thin/cachectic, speech difficult to understand at times/garbled HEENT:  Noted head/neck abnormalities s/p surgery  Neuro: alert and oriented, ambulatory  Labs: CBC    Component Value Date/Time   WBC 4.0 11/21/2013 0848   WBC 9.9 06/21/2013 0842   RBC 2.43* 11/21/2013 0848   RBC 4.39 06/21/2013 0842   HGB 9.8* 11/21/2013 0848   HGB 14.5 06/21/2013 0842   HCT 29.8* 11/21/2013 0848   HCT 43.7 06/21/2013 0842   PLT 315 11/21/2013 0848   PLT 281 06/21/2013 0842   MCV 122.6* 11/21/2013 0848   MCV 99.5 06/21/2013 0842   MCH 40.2* 11/21/2013 0848   MCH 33.0 06/21/2013 0842   MCHC 32.8 11/21/2013 0848   MCHC 33.2 06/21/2013 0842   RDW 20.1* 11/21/2013 0848   RDW 13.0 06/21/2013 0842   LYMPHSABS 0.9 11/21/2013 0848   MONOABS 0.8 11/21/2013 0848   EOSABS 0.0 11/21/2013 0848   BASOSABS 0.0 11/21/2013 0848    BMET     Component Value Date/Time   NA 138 11/21/2013 0849   K 5.0 11/21/2013 0849   CO2 28 11/21/2013 0849   GLUCOSE 124 11/21/2013 0849   BUN 9.1 11/21/2013 0849   CREATININE 0.6* 11/21/2013 0849   CALCIUM 9.8 11/21/2013 0849    CMP     Component Value Date/Time   NA 138 11/21/2013 0849   K 5.0 11/21/2013 0849   CO2 28 11/21/2013 0849   GLUCOSE 124 11/21/2013 0849   BUN 9.1 11/21/2013 0849   CREATININE 0.6* 11/21/2013 0849   CALCIUM 9.8 11/21/2013 0849   PROT 7.9 11/21/2013 0849   ALBUMIN 3.5 11/21/2013 0849   AST 26 11/21/2013 0849   ALT 18 11/21/2013 0849   ALKPHOS 72 11/21/2013 0849   BILITOT 0.21 11/21/2013 0849   ECOG PERFORMANCE STATUS* (Eastern Cooperative Oncology Group)  0 Fully active, able to continue with all pre-disease activities without restriction. Pt score  1 Restricted in physically strenuous activity but ambulatory and able to carry out work of a light or sedentary nature, e.g., light house work, office work. 1  2 Ambulatory and capable of all self-care but unable to carry out any work activities. Up and about more than 50% of waking hours.    3 Capable of only limited self-care. Confined to bed or chair more than 50% of waking hours.   4 Completely disabled. Cannot carry on any self-care. Totally confined to bed or chair.  5 Dead.    As published in Am. J. Clin. Oncol.: Eustace Pen, M.M., Colon Flattery., Hasty, D.C., Horton, Sharen Hint., Drexel Iha, P.P.: Toxicity And Response Criteria Of The [facility]. [place] 6:226-333, 1982.  The ECOG Performance Status is in the public domain therefore available for public use. To duplicate the scale, please cite the reference above and credit the [facility], Tyler Pita M.D., Group Chair    Time In Time Out Total Time Spent with Patient Total Overall Time  1300 1400 60 min 60 min    Greater than 50%  of this time was spent counseling and coordinating  care related to the above assessment and plan.   Wadie Lessen NP  Palliative Medicine Team Team Phone # [phone] Pager [phone]  Discussed with Prudencio Burly RN

## 2013-12-16 NOTE — Progress Notes (Signed)
   Weekly Management Note:  outpatient     ICD-9-CM ICD-10-CM   1. 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  CBCT/MVCT images/Port film x-rays were reviewed.   Baking soda mouth washes/gargle help the thickened saliva in oral cavity.  He is doing the same as last week, he reports.  Physical Findings:  height is 6\' 1"  (1.854 m) and weight is 113 lb 4.8 oz (51.393 kg). His temperature is 97.7 F (36.5 C). His blood pressure is 116/68 and his pulse is 90.  slightly smaller mass in right upper neck, no thrush, + thick saliva.  Impression:  The patient is tolerating radiotherapy.  Plan:  Continue radiotherapy as planned. F/u in 3-5 weeks. Card given.  ________________________________   Eppie Gibson, M.D.

## 2013-12-17 ENCOUNTER — Ambulatory Visit
Admission: RE | Admit: 2013-12-17 | Discharge: 2013-12-17 | Disposition: A | Discharge status: Home / Self Care | Payer: BC Managed Care – PPO | Source: Ambulatory Visit | Attending: Radiation Oncology | Admitting: Radiation Oncology

## 2013-12-17 DIAGNOSIS — Z510 Encounter for antineoplastic radiation therapy: Secondary | ICD-10-CM | POA: Diagnosis not present

## 2013-12-18 ENCOUNTER — Encounter: Payer: Self-pay | Admitting: Radiation Oncology

## 2013-12-18 ENCOUNTER — Telehealth: Payer: Self-pay | Admitting: Hematology and Oncology

## 2013-12-18 ENCOUNTER — Ambulatory Visit (HOSPITAL_BASED_OUTPATIENT_CLINIC_OR_DEPARTMENT_OTHER): Payer: BC Managed Care – PPO | Admitting: Hematology and Oncology

## 2013-12-18 ENCOUNTER — Ambulatory Visit
Admission: RE | Admit: 2013-12-18 | Discharge: 2013-12-18 | Disposition: A | Discharge status: Home / Self Care | Payer: BC Managed Care – PPO | Source: Ambulatory Visit | Attending: Radiation Oncology | Admitting: Radiation Oncology

## 2013-12-18 ENCOUNTER — Encounter: Payer: Self-pay | Admitting: *Deleted

## 2013-12-18 ENCOUNTER — Encounter: Payer: Self-pay | Admitting: Hematology and Oncology

## 2013-12-18 ENCOUNTER — Other Ambulatory Visit (HOSPITAL_BASED_OUTPATIENT_CLINIC_OR_DEPARTMENT_OTHER): Payer: BC Managed Care – PPO

## 2013-12-18 VITALS — BP 116/67 | HR 96 | Temp 97.9°F | Resp 18 | Ht 73.0 in | Wt 111.1 lb

## 2013-12-18 DIAGNOSIS — R070 Pain in throat: Secondary | ICD-10-CM

## 2013-12-18 DIAGNOSIS — C109 Malignant neoplasm of oropharynx, unspecified: Secondary | ICD-10-CM

## 2013-12-18 DIAGNOSIS — R21 Rash and other nonspecific skin eruption: Secondary | ICD-10-CM

## 2013-12-18 DIAGNOSIS — R634 Abnormal weight loss: Secondary | ICD-10-CM

## 2013-12-18 DIAGNOSIS — Z510 Encounter for antineoplastic radiation therapy: Secondary | ICD-10-CM | POA: Diagnosis not present

## 2013-12-18 DIAGNOSIS — D630 Anemia in neoplastic disease: Secondary | ICD-10-CM

## 2013-12-18 LAB — CBC WITH DIFFERENTIAL/PLATELET
BASO%: 0.3 % (ref 0.0–2.0)
Basophils Absolute: 0 10*3/uL (ref 0.0–0.1)
EOS%: 1.1 % (ref 0.0–7.0)
Eosinophils Absolute: 0.1 10*3/uL (ref 0.0–0.5)
HCT: 28.9 % — ABNORMAL LOW (ref 38.4–49.9)
HGB: 9.4 g/dL — ABNORMAL LOW (ref 13.0–17.1)
LYMPH%: 11.2 % — ABNORMAL LOW (ref 14.0–49.0)
MCH: 34.6 pg — AB (ref 27.2–33.4)
MCHC: 32.5 g/dL (ref 32.0–36.0)
MCV: 106.3 fL — ABNORMAL HIGH (ref 79.3–98.0)
MONO#: 0.9 10*3/uL (ref 0.1–0.9)
MONO%: 10.3 % (ref 0.0–14.0)
NEUT%: 77.1 % — ABNORMAL HIGH (ref 39.0–75.0)
NEUTROS ABS: 6.8 10*3/uL — AB (ref 1.5–6.5)
Platelets: 264 10*3/uL (ref 140–400)
RBC: 2.72 10*6/uL — ABNORMAL LOW (ref 4.20–5.82)
RDW: 19.8 % — AB (ref 11.0–14.6)
WBC: 8.8 10*3/uL (ref 4.0–10.3)
lymph#: 1 10*3/uL (ref 0.9–3.3)

## 2013-12-18 LAB — COMPREHENSIVE METABOLIC PANEL (CC13)
ALBUMIN: 2.8 g/dL — AB (ref 3.5–5.0)
ALT: 24 U/L (ref 0–55)
AST: 26 U/L (ref 5–34)
Alkaline Phosphatase: 66 U/L (ref 40–150)
Anion Gap: 9 mEq/L (ref 3–11)
BUN: 8.7 mg/dL (ref 7.0–26.0)
CALCIUM: 9.5 mg/dL (ref 8.4–10.4)
CO2: 28 mEq/L (ref 22–29)
Chloride: 97 mEq/L — ABNORMAL LOW (ref 98–109)
Creatinine: 0.6 mg/dL — ABNORMAL LOW (ref 0.7–1.3)
Glucose: 124 mg/dl (ref 70–140)
POTASSIUM: 3.8 meq/L (ref 3.5–5.1)
SODIUM: 134 meq/L — AB (ref 136–145)
TOTAL PROTEIN: 7.7 g/dL (ref 6.4–8.3)
Total Bilirubin: 0.24 mg/dL (ref 0.20–1.20)

## 2013-12-18 LAB — TECHNOLOGIST REVIEW

## 2013-12-18 LAB — MAGNESIUM (CC13): MAGNESIUM: 2 mg/dL (ref 1.5–2.5)

## 2013-12-18 MED ORDER — MORPHINE SULFATE 30 MG PO TABS: 30.0000 mg | ORAL_TABLET | ORAL | Status: DC | PRN

## 2013-12-18 MED ORDER — FIRST-BXN MOUTHWASH MT SUSP: 5.0000 mL | Freq: Four times a day (QID) | OROMUCOSAL | Status: AC

## 2013-12-18 NOTE — Progress Notes (Signed)
Met with pt during final RT to offer support and to celebrate end of radiation treatment.   He understands that my role as his navigator continues as he begins additional chemotherapy.  Gayleen Orem, RN, BSN, [place] at [place] [phone]

## 2013-12-18 NOTE — Assessment & Plan Note (Signed)
This is likely due to recent treatment. The patient denies recent history of bleeding such as epistaxis, hematuria or hematochezia. He is asymptomatic from the anemia. I will observe for now.  He does not require transfusion now. 

## 2013-12-18 NOTE — Assessment & Plan Note (Signed)
He tolerated radiation therapy well. I plan to repeat imaging study at the end of the month and see him back for further discussion about palliative chemotherapy.

## 2013-12-18 NOTE — Progress Notes (Signed)
[facility] OFFICE PROGRESS NOTE  Patient Care Team: No Pcp Per Patient as PCP - General (General Practice) Heath Lark, MD as Consulting Physician (Hematology and Oncology) Ascencion Dike, MD as Consulting Physician (Otolaryngology) Brooks Sailors, RN as Oncology Nurse Navigator (Oncology) Eppie Gibson, MD as Attending Physician (Radiation Oncology) Karie Mainland, RD as Dietitian (Nutrition)  SUMMARY OF ONCOLOGIC HISTORY: Oncology History   Oropharyngeal cancer, HPV negative   Primary site: Pharynx - Oropharynx   Staging method: AJCC 7th Edition   Clinical: Stage IVC (T4b, N3, M1) signed by Heath Lark, MD on 07/01/2013  8:32 PM   Summary: Stage IVC (T4b, N3, M1)         Oropharyngeal cancer   05/06/2013 Imaging 4.2 x 4.7 x 6.1 cm heterogeneous right neck mass in the right level 2 & level 3 stations. It invades the carotid space, obstructing the internal jugular vein. The primary lesion may be along the inferior aspect of the right palatine tonsil    05/22/2013 Procedure Laryngoscopy revealed abnormalities beneath the right tonsil area. Unfortunately tonsillectomy and right neck biopsy came back benign tissue.   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   06/24/2013 Surgery He underwent multiple extraction of tooth numbers 2, 3, 6, 8, 9, 11, 12, 13, 14, 15, 19, 20, 22, 23, 24, 25, 26, 27, and 28 along with 4 Quadrants of alveoloplasty   06/28/2013 Imaging PET/CT scan results show widespread pulmonary metastasis.   07/11/2013 - 11/07/2013 Chemotherapy The patient received palliative chemotherapy with 5-FU, carboplatin and weekly Erbitux.   09/09/2013 Imaging Repeat PET CT scan show good response to treatment.   11/14/2013 Imaging Repeat PET/CT showed disease progression   11/26/2013 - 12/18/2013 Radiation Therapy He received palliative radiation therapy to the neck and the back.    INTERVAL HISTORY: Please see below for problem oriented  charting. He is doing well with radiation. He has mild skin injury around his neck. He has mild mucositis and neck pain, with improved pain control with current prescription pain medicine. He is able to eat well and has not lost further weight.  REVIEW OF SYSTEMS:   Constitutional: Denies fevers, chills or abnormal weight loss Eyes: Denies blurriness of vision Respiratory: Denies cough, dyspnea or wheezes Cardiovascular: Denies palpitation, chest discomfort or lower extremity swelling Gastrointestinal:  Denies nausea, heartburn or change in bowel habits Lymphatics: Denies new lymphadenopathy or easy bruising Neurological:Denies numbness, tingling or new weaknesses Behavioral/Psych: Mood is stable, no new changes  All other systems were reviewed with the patient and are negative.  I have reviewed the past medical history, past surgical history, social history and family history with the patient and they are unchanged from previous note.  ALLERGIES:  has No Known Allergies.  MEDICATIONS:  Current Outpatient Prescriptions  Medication Sig Dispense Refill  . Diphenhyd-Lidocaine-Nystatin (FIRST-BXN MOUTHWASH) SUSP Take 5 mLs by mouth 4 (four) times daily. Verbal order from Dr. Alvy Bimler for these ingredients plus Maalox in a 1:1:1:1 concentration.  Called to [employer] for Quantity Sufficient. 1 Bottle 2  . docusate sodium (COLACE) 100 MG capsule Take 1 capsule (100 mg total) by mouth 2 (two) times daily. 60 capsule 0  . lidocaine-prilocaine (EMLA) cream Apply 1 application topically as needed. 30 g 6  . morphine (MSIR) 30 MG tablet Take 1 tablet (30 mg total) by mouth every 4 (four) hours as needed for severe pain. 90 tablet 0   No current facility-administered medications for  this visit.    PHYSICAL EXAMINATION: ECOG PERFORMANCE STATUS: 1 - Symptomatic but completely ambulatory  Filed Vitals:   12/18/13 0947  BP: 116/67  Pulse: 96  Temp: 97.9 F (36.6 C)  Resp: 18   Filed  Weights   12/18/13 0947  Weight: 111 lb 1.6 oz (50.395 kg)    GENERAL:alert, no distress and comfortable. He looks thin and cachectic SKIN: Mild radiation-induced skin injury around the neck is noted. EYES: normal, Conjunctiva are pink and non-injected, sclera clear OROPHARYNX:no exudate, no erythema and lips, buccal mucosa, and tongue normal mild mucositis, no thrush. NECK: supple, thyroid normal size, non-tender, without nodularity LYMPH:  Persistent neck lymphadenopathy on the right side, unchanged compared to previous visit  LUNGS: clear to auscultation and percussion with normal breathing effort HEART: regular rate & rhythm and no murmurs and no lower extremity edema ABDOMEN:abdomen soft, non-tender and normal bowel sounds Musculoskeletal:no cyanosis of digits and no clubbing  NEURO: alert & oriented x 3 with fluent speech, no focal motor/sensory deficits  LABORATORY DATA:  I have reviewed the data as listed    Component Value Date/Time   NA 134* 12/18/2013 0922   K 3.8 12/18/2013 0922   CO2 28 12/18/2013 0922   GLUCOSE 124 12/18/2013 0922   BUN 8.7 12/18/2013 0922   CREATININE 0.6* 12/18/2013 0922   CALCIUM 9.5 12/18/2013 0922   PROT 7.7 12/18/2013 0922   ALBUMIN 2.8* 12/18/2013 0922   AST 26 12/18/2013 0922   ALT 24 12/18/2013 0922   ALKPHOS 66 12/18/2013 0922   BILITOT 0.24 12/18/2013 0922    No results found for: SPEP, UPEP  Lab Results  Component Value Date   WBC 8.8 12/18/2013   NEUTROABS 6.8* 12/18/2013   HGB 9.4* 12/18/2013   HCT 28.9* 12/18/2013   MCV 106.3* 12/18/2013   PLT 264 12/18/2013      Chemistry      Component Value Date/Time   NA 134* 12/18/2013 0922   K 3.8 12/18/2013 0922   CO2 28 12/18/2013 0922   BUN 8.7 12/18/2013 0922   CREATININE 0.6* 12/18/2013 0922      Component Value Date/Time   CALCIUM 9.5 12/18/2013 0922   ALKPHOS 66 12/18/2013 0922   AST 26 12/18/2013 0922   ALT 24 12/18/2013 0922   BILITOT 0.24 12/18/2013 0922      ASSESSMENT & PLAN:  Oropharyngeal cancer He tolerated radiation therapy well. I plan to repeat imaging study at the end of the month and see him back for further discussion about palliative chemotherapy.  Anemia in neoplastic disease This is likely due to recent treatment. The patient denies recent history of bleeding such as epistaxis, hematuria or hematochezia. He is asymptomatic from the anemia. I will observe for now.  He does not require transfusion now.   Skin rash This is related to recent radiation. It is only mild, grade 1. Continue topical emollient cream as needed.  Throat pain I refilled his prescription morphine sulfate. He is doing better with increased dose. We discussed about narcotic refill policy.    Weight loss He looks cachectic. Continue to encourage oral intake. He will continue close follow-up with dietitian.   Orders Placed This Encounter  Procedures  . NM PET Image Restag (PS) Skull Base To Thigh    Standing Status: Future     Number of Occurrences:      Standing Expiration Date: 02/17/2015    Order Specific Question:  Reason for Exam (SYMPTOM  OR DIAGNOSIS REQUIRED)  Answer:  staging metastatic oropharyngeal ca    Order Specific Question:  Preferred imaging location?    Answer:  [facility]   All questions were answered. The patient knows to call the clinic with any problems, questions or concerns. No barriers to learning was detected. I spent 30 minutes counseling the patient face to face. The total time spent in the appointment was 40 minutes and more than 50% was on counseling and review of test results     [facility], [place], MD 12/18/2013 9:00 PM

## 2013-12-18 NOTE — Assessment & Plan Note (Signed)
He looks cachectic. Continue to encourage oral intake. He will continue close follow-up with dietitian. 

## 2013-12-18 NOTE — Assessment & Plan Note (Signed)
This is related to recent radiation. It is only mild, grade 1. Continue topical emollient cream as needed.

## 2013-12-18 NOTE — Assessment & Plan Note (Signed)
I refilled his prescription morphine sulfate. He is doing better with increased dose. We discussed about narcotic refill policy.  

## 2013-12-18 NOTE — Telephone Encounter (Signed)
gv and printedj appt sched and avs for pt for Dec..Marland KitchenMarland Kitchen

## 2013-12-19 NOTE — Progress Notes (Signed)
  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      Plan: The patient has completed radiation treatment. The patient will return to radiation oncology clinic for routine followup in one month. I advised them to call or return sooner if they have any questions or concerns related to their recovery or treatment.  -----------------------------------  Eppie Gibson, MD

## 2013-12-23 DIAGNOSIS — R531 Weakness: Secondary | ICD-10-CM | POA: Insufficient documentation

## 2013-12-23 DIAGNOSIS — Z7189 Other specified counseling: Secondary | ICD-10-CM | POA: Insufficient documentation

## 2013-12-23 NOTE — Addendum Note (Signed)
Encounter addended by: Knox Royalty, NP on: 12/23/2013  9:05 AM<BR>     Documentation filed: Charges VN, Problem List, Clinical Notes

## 2014-01-14 ENCOUNTER — Encounter: Payer: Self-pay | Admitting: *Deleted

## 2014-01-15 ENCOUNTER — Encounter (HOSPITAL_COMMUNITY): Payer: Self-pay

## 2014-01-15 ENCOUNTER — Ambulatory Visit (HOSPITAL_COMMUNITY)
Admission: RE | Admit: 2014-01-15 | Discharge: 2014-01-15 | Disposition: A | Discharge status: Home / Self Care | Payer: BLUE CROSS/BLUE SHIELD | Source: Ambulatory Visit | Attending: Hematology and Oncology | Admitting: Hematology and Oncology

## 2014-01-15 ENCOUNTER — Other Ambulatory Visit (HOSPITAL_BASED_OUTPATIENT_CLINIC_OR_DEPARTMENT_OTHER): Payer: BLUE CROSS/BLUE SHIELD

## 2014-01-15 DIAGNOSIS — R070 Pain in throat: Secondary | ICD-10-CM

## 2014-01-15 DIAGNOSIS — C7801 Secondary malignant neoplasm of right lung: Secondary | ICD-10-CM | POA: Diagnosis not present

## 2014-01-15 DIAGNOSIS — C7951 Secondary malignant neoplasm of bone: Secondary | ICD-10-CM | POA: Insufficient documentation

## 2014-01-15 DIAGNOSIS — C109 Malignant neoplasm of oropharynx, unspecified: Secondary | ICD-10-CM | POA: Insufficient documentation

## 2014-01-15 DIAGNOSIS — C7802 Secondary malignant neoplasm of left lung: Secondary | ICD-10-CM | POA: Diagnosis not present

## 2014-01-15 DIAGNOSIS — C799 Secondary malignant neoplasm of unspecified site: Secondary | ICD-10-CM | POA: Diagnosis present

## 2014-01-15 LAB — CBC WITH DIFFERENTIAL/PLATELET
BASO%: 1.2 % (ref 0.0–2.0)
Basophils Absolute: 0.1 10*3/uL (ref 0.0–0.1)
EOS%: 1.7 % (ref 0.0–7.0)
Eosinophils Absolute: 0.1 10*3/uL (ref 0.0–0.5)
HEMATOCRIT: 32.2 % — AB (ref 38.4–49.9)
HGB: 10 g/dL — ABNORMAL LOW (ref 13.0–17.1)
LYMPH#: 0.7 10*3/uL — AB (ref 0.9–3.3)
LYMPH%: 10.8 % — ABNORMAL LOW (ref 14.0–49.0)
MCH: 30.7 pg (ref 27.2–33.4)
MCHC: 31 g/dL — AB (ref 32.0–36.0)
MCV: 98.9 fL — AB (ref 79.3–98.0)
MONO#: 0.9 10*3/uL (ref 0.1–0.9)
MONO%: 12.8 % (ref 0.0–14.0)
NEUT#: 4.9 10*3/uL (ref 1.5–6.5)
NEUT%: 73.5 % (ref 39.0–75.0)
Platelets: 303 10*3/uL (ref 140–400)
RBC: 3.25 10*6/uL — AB (ref 4.20–5.82)
RDW: 25.5 % — AB (ref 11.0–14.6)
WBC: 6.7 10*3/uL (ref 4.0–10.3)

## 2014-01-15 LAB — COMPREHENSIVE METABOLIC PANEL (CC13)
ALBUMIN: 2.9 g/dL — AB (ref 3.5–5.0)
ALT: 17 U/L (ref 0–55)
AST: 23 U/L (ref 5–34)
Alkaline Phosphatase: 69 U/L (ref 40–150)
Anion Gap: 8 mEq/L (ref 3–11)
BILIRUBIN TOTAL: 0.29 mg/dL (ref 0.20–1.20)
BUN: 10.5 mg/dL (ref 7.0–26.0)
CHLORIDE: 100 meq/L (ref 98–109)
CO2: 29 mEq/L (ref 22–29)
CREATININE: 0.6 mg/dL — AB (ref 0.7–1.3)
Calcium: 9.3 mg/dL (ref 8.4–10.4)
EGFR: >90 mL/min/{1.73_m2} (ref >90)
GLUCOSE: 123 mg/dL (ref 70–140)
Potassium: 3.9 mEq/L (ref 3.5–5.1)
Sodium: 138 mEq/L (ref 136–145)
TOTAL PROTEIN: 8.1 g/dL (ref 6.4–8.3)

## 2014-01-15 LAB — GLUCOSE, CAPILLARY: Glucose-Capillary: 130 mg/dL — ABNORMAL HIGH (ref 70–99)

## 2014-01-15 LAB — MAGNESIUM (CC13): Magnesium: 2.3 mg/dl (ref 1.5–2.5)

## 2014-01-15 MED ORDER — FLUDEOXYGLUCOSE F - 18 (FDG) INJECTION
5.9000 | Freq: Once | INTRAVENOUS | Status: AC | PRN
  Administered 2014-01-15: 5.9 via INTRAVENOUS

## 2014-01-16 ENCOUNTER — Encounter: Payer: Self-pay | Admitting: Hematology and Oncology

## 2014-01-16 ENCOUNTER — Encounter: Payer: Self-pay | Admitting: *Deleted

## 2014-01-16 ENCOUNTER — Ambulatory Visit (HOSPITAL_BASED_OUTPATIENT_CLINIC_OR_DEPARTMENT_OTHER): Payer: BLUE CROSS/BLUE SHIELD | Admitting: Hematology and Oncology

## 2014-01-16 ENCOUNTER — Telehealth: Payer: Self-pay | Admitting: Hematology and Oncology

## 2014-01-16 VITALS — BP 111/71 | HR 78 | Temp 98.3°F | Resp 18 | Ht 73.0 in | Wt 108.1 lb

## 2014-01-16 DIAGNOSIS — R070 Pain in throat: Secondary | ICD-10-CM

## 2014-01-16 DIAGNOSIS — D630 Anemia in neoplastic disease: Secondary | ICD-10-CM

## 2014-01-16 DIAGNOSIS — C109 Malignant neoplasm of oropharynx, unspecified: Secondary | ICD-10-CM

## 2014-01-16 MED ORDER — MORPHINE SULFATE 30 MG PO TABS: 30.0000 mg | ORAL_TABLET | Freq: Four times a day (QID) | ORAL | Status: DC | PRN

## 2014-01-16 NOTE — Telephone Encounter (Signed)
[facility] ADN PRINTED APPT SCHED AND AVS FO RPT FOR jAN 2016....SED ADDED TX.

## 2014-01-16 NOTE — Progress Notes (Signed)
[facility] OFFICE PROGRESS NOTE  Patient Care Team: No Pcp Per Patient as PCP - General (General Practice) Heath Lark, MD as Consulting Physician (Hematology and Oncology) Ascencion Dike, MD as Consulting Physician (Otolaryngology) Brooks Sailors, RN as Oncology Nurse Navigator (Oncology) Eppie Gibson, MD as Attending Physician (Radiation Oncology) Karie Mainland, RD as Dietitian (Nutrition)  SUMMARY OF ONCOLOGIC HISTORY: Oncology History   Oropharyngeal cancer, HPV negative   Primary site: Pharynx - Oropharynx   Staging method: AJCC 7th Edition   Clinical: Stage IVC (T4b, N3, M1) signed by Heath Lark, MD on 07/01/2013  8:32 PM   Summary: Stage IVC (T4b, N3, M1)         Oropharyngeal cancer   05/06/2013 Imaging 4.2 x 4.7 x 6.1 cm heterogeneous right neck mass in the right level 2 & level 3 stations. It invades the carotid space, obstructing the internal jugular vein. The primary lesion may be along the inferior aspect of the right palatine tonsil    05/22/2013 Procedure Laryngoscopy revealed abnormalities beneath the right tonsil area. Unfortunately tonsillectomy and right neck biopsy came back benign tissue.   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   06/24/2013 Surgery He underwent multiple extraction of tooth numbers 2, 3, 6, 8, 9, 11, 12, 13, 14, 15, 19, 20, 22, 23, 24, 25, 26, 27, and 28 along with 4 Quadrants of alveoloplasty   06/28/2013 Imaging PET/CT scan results show widespread pulmonary metastasis.   07/11/2013 - 11/07/2013 Chemotherapy The patient received palliative chemotherapy with 5-FU, carboplatin and weekly Erbitux.   09/09/2013 Imaging Repeat PET CT scan show good response to treatment.   11/14/2013 Imaging Repeat PET/CT showed disease progression   11/26/2013 - 12/18/2013 Radiation Therapy He received palliative radiation therapy to the neck and the back.   01/15/2014 Imaging Repeat PET/CT scan showed improvement in  disease control around his neck but significant progression of pulmonary metastases    INTERVAL HISTORY: Please see below for problem oriented charting. He returns to review test results. His neck pain is improving. He denies difficulty swallowing. Denies any cough or chest pain.  REVIEW OF SYSTEMS:   Constitutional: Denies fevers, chills or abnormal weight loss Eyes: Denies blurriness of vision Ears, nose, mouth, throat, and face: Denies mucositis or sore throat Respiratory: Denies cough, dyspnea or wheezes Cardiovascular: Denies palpitation, chest discomfort or lower extremity swelling Gastrointestinal:  Denies nausea, heartburn or change in bowel habits Skin: Denies abnormal skin rashes Lymphatics: Denies new lymphadenopathy or easy bruising Neurological:Denies numbness, tingling or new weaknesses Behavioral/Psych: Mood is stable, no new changes  All other systems were reviewed with the patient and are negative.  I have reviewed the past medical history, past surgical history, social history and family history with the patient and they are unchanged from previous note.  ALLERGIES:  has No Known Allergies.  MEDICATIONS:  Current Outpatient Prescriptions  Medication Sig Dispense Refill  . Diphenhyd-Lidocaine-Nystatin (FIRST-BXN MOUTHWASH) SUSP Take 5 mLs by mouth 4 (four) times daily. Verbal order from Dr. Alvy Bimler for these ingredients plus Maalox in a 1:1:1:1 concentration.  Called to [employer] for Quantity Sufficient. 1 Bottle 2  . docusate sodium (COLACE) 100 MG capsule Take 1 capsule (100 mg total) by mouth 2 (two) times daily. 60 capsule 0  . lidocaine-prilocaine (EMLA) cream Apply 1 application topically as needed. 30 g 6  . morphine (MSIR) 30 MG tablet Take 1 tablet (30 mg total) by mouth every 6 (six)  hours as needed for severe pain. 90 tablet 0  . Ondansetron HCl (ZOFRAN PO) Take by mouth as needed.    . Prochlorperazine Maleate (COMPAZINE PO) Take by mouth as  needed.     No current facility-administered medications for this visit.    PHYSICAL EXAMINATION: ECOG PERFORMANCE STATUS: 0 - Asymptomatic  Filed Vitals:   01/16/14 0911  BP: 111/71  Pulse: 78  Temp: 98.3 F (36.8 C)  Resp: 18   Filed Weights   01/16/14 0911  Weight: 108 lb 1.6 oz (49.034 kg)    GENERAL:alert, no distress and comfortable. He looks extremely thin and cachectic SKIN: skin color, texture, turgor are normal, no rashes or significant lesions EYES: normal, Conjunctiva are pink and non-injected, sclera clear OROPHARYNX:no exudate, no erythema and lips, buccal mucosa, and tongue normal  NECK: Significant abnormalities on the right side of his neck. Much improve compared to before.  Musculoskeletal:no cyanosis of digits and no clubbing  NEURO: alert & oriented x 3 with fluent speech, no focal motor/sensory deficits  LABORATORY DATA:  I have reviewed the data as listed    Component Value Date/Time   NA 138 01/15/2014 0805   K 3.9 01/15/2014 0805   CO2 29 01/15/2014 0805   GLUCOSE 123 01/15/2014 0805   BUN 10.5 01/15/2014 0805   CREATININE 0.6* 01/15/2014 0805   CALCIUM 9.3 01/15/2014 0805   PROT 8.1 01/15/2014 0805   ALBUMIN 2.9* 01/15/2014 0805   AST 23 01/15/2014 0805   ALT 17 01/15/2014 0805   ALKPHOS 69 01/15/2014 0805   BILITOT 0.29 01/15/2014 0805    No results found for: SPEP, UPEP  Lab Results  Component Value Date   WBC 6.7 01/15/2014   NEUTROABS 4.9 01/15/2014   HGB 10.0* 01/15/2014   HCT 32.2* 01/15/2014   MCV 98.9* 01/15/2014   PLT 303 01/15/2014      Chemistry      Component Value Date/Time   NA 138 01/15/2014 0805   K 3.9 01/15/2014 0805   CO2 29 01/15/2014 0805   BUN 10.5 01/15/2014 0805   CREATININE 0.6* 01/15/2014 0805      Component Value Date/Time   CALCIUM 9.3 01/15/2014 0805   ALKPHOS 69 01/15/2014 0805   AST 23 01/15/2014 0805   ALT 17 01/15/2014 0805   BILITOT 0.29 01/15/2014 0805       RADIOGRAPHIC  STUDIES: I have personally reviewed the radiological images as listed and agreed with the findings in the report. Nm Pet Image Restag (ps) Skull Base To Thigh  01/15/2014   CLINICAL DATA:  Subsequent treatment strategy for metastatic oropharyngeal carcinoma.  EXAM: NUCLEAR MEDICINE PET SKULL BASE TO THIGH  TECHNIQUE: 5.9 mCi F-18 FDG was injected intravenously. Full-ring PET imaging was performed from the skull base to thigh after the radiotracer. CT data was obtained and used for attenuation correction and anatomic localization.  FASTING BLOOD GLUCOSE:  Value: 127 mg/dl  COMPARISON:  11/14/2013  FINDINGS: NECK  Large right upper neck mass has decreased in size, currently measuring 6.4 x 5.8 cm compared to 7.1 x 6.6 cm previously. This mass shows decreased metabolic activity, with an SUV max currently measuring 4.1 compared to 7.7 previously. This mass again appears to abut the right skullbase and involve the right lateral mass of C1 vertebra. No other hypermetabolic cervical lymph nodes identified.  CHEST  Diffuse bilateral hypermetabolic pulmonary metastases show increased in size and number since previous study. Index nodule in the posterior right lower lobe currently  measures 2.0 cm on image 74/series 8 compared to 1.1 cm previously. SUV max now measures 5.2 compared to 3.9 previously. No hypermetabolic hilar or mediastinal lymph nodes identified on today's exam.  ABDOMEN/PELVIS  No abnormal hypermetabolic activity within the liver, pancreas, adrenal glands, or spleen. No hypermetabolic lymph nodes in the abdomen or pelvis.  SKELETON  Decreased FDG uptake is seen within the left C5 vertebra, with SUV max currently measuring 3.3 compared to 5.3 previously. No other hypermetabolic bone metastases identified.  IMPRESSION: Partial metabolic response to therapy of large right upper neck mass and C5 vertebral bone metastasis.  Interval progression of diffuse bilateral pulmonary metastases.  No evidence of  abdominal or pelvic metastatic disease.   Electronically Signed   By: Earle Gell M.D.   On: 01/15/2014 10:42     ASSESSMENT & PLAN:  Oropharyngeal cancer We discussed the role of chemotherapy. The intent is for palliative.  We discussed some of the risks, benefits, side-effects of Paclitaxel.   Some of the short term side-effects included, though not limited to, risk of severe allergic reaction, fatigue, weight loss, pancytopenia, life-threatening infections, need for transfusions of blood products, nausea, vomiting, change in bowel habits, loss of hair, admission to hospital for various reasons, and risks of death.   Long term side-effects are also discussed including risks of infertility, permanent damage to nerve function, chronic fatigue, and rare secondary malignancy including bone marrow disorders.   The patient is aware that the response rates discussed earlier is not guaranteed.    After a long discussion, patient made an informed decision to proceed with the prescribed plan of care.   Patient education material was dispensed I plan to start him on weekly treatment starting 01/23/2014, with 3 weeks on 1 week off therapy. I will see him prior to week 3 treatment for toxicity review.  Anemia in neoplastic disease This is likely due to recent treatment. The patient denies recent history of bleeding such as epistaxis, hematuria or hematochezia. He is asymptomatic from the anemia. I will observe for now.  He does not require transfusion now.   Throat pain I refilled his prescription morphine sulfate. He is doing better with increased dose. We discussed about narcotic refill policy.    Orders Placed This Encounter  Procedures  . CBC with Differential    Standing Status: Standing     Number of Occurrences: 20     Standing Expiration Date: 01/17/2015  . Comprehensive metabolic panel    Standing Status: Standing     Number of Occurrences: 20     Standing Expiration Date:  01/17/2015   All questions were answered. The patient knows to call the clinic with any problems, questions or concerns. No barriers to learning was detected. I spent 30 minutes counseling the patient face to face. The total time spent in the appointment was 40 minutes and more than 50% was on counseling and review of test results     [facility], [place], MD 01/16/2014 12:41 PM

## 2014-01-16 NOTE — Assessment & Plan Note (Signed)
This is likely due to recent treatment. The patient denies recent history of bleeding such as epistaxis, hematuria or hematochezia. He is asymptomatic from the anemia. I will observe for now.  He does not require transfusion now. 

## 2014-01-16 NOTE — Assessment & Plan Note (Signed)
I refilled his prescription morphine sulfate. He is doing better with increased dose. We discussed about narcotic refill policy.

## 2014-01-16 NOTE — Assessment & Plan Note (Signed)
We discussed the role of chemotherapy. The intent is for palliative.  We discussed some of the risks, benefits, side-effects of Paclitaxel.   Some of the short term side-effects included, though not limited to, risk of severe allergic reaction, fatigue, weight loss, pancytopenia, life-threatening infections, need for transfusions of blood products, nausea, vomiting, change in bowel habits, loss of hair, admission to hospital for various reasons, and risks of death.   Long term side-effects are also discussed including risks of infertility, permanent damage to nerve function, chronic fatigue, and rare secondary malignancy including bone marrow disorders.   The patient is aware that the response rates discussed earlier is not guaranteed.    After a long discussion, patient made an informed decision to proceed with the prescribed plan of care.   Patient education material was dispensed I plan to start him on weekly treatment starting 01/23/2014, with 3 weeks on 1 week off therapy. I will see him prior to week 3 treatment for toxicity review.

## 2014-01-16 NOTE — Patient Instructions (Signed)
Paclitaxel injection What is this medicine? PACLITAXEL (PAK li TAX el) is a chemotherapy drug. It targets fast dividing cells, like cancer cells, and causes these cells to die. This medicine is used to treat ovarian cancer, breast cancer, and other cancers. This medicine may be used for other purposes; ask your health care provider or pharmacist if you have questions. COMMON BRAND NAME(S): Onxol, Taxol What should I tell my health care provider before I take this medicine? They need to know if you have any of these conditions: -blood disorders -irregular heartbeat -infection (especially a virus infection such as chickenpox, cold sores, or herpes) -liver disease -previous or ongoing radiation therapy -an unusual or allergic reaction to paclitaxel, alcohol, polyoxyethylated castor oil, other chemotherapy agents, other medicines, foods, dyes, or preservatives -pregnant or trying to get pregnant -breast-feeding How should I use this medicine? This drug is given as an infusion into a vein. It is administered in a hospital or clinic by a specially trained health care professional. Talk to your pediatrician regarding the use of this medicine in children. Special care may be needed. Overdosage: If you think you have taken too much of this medicine contact a poison control center or emergency room at once. NOTE: This medicine is only for you. Do not share this medicine with others. What if I miss a dose? It is important not to miss your dose. Call your doctor or health care professional if you are unable to keep an appointment. What may interact with this medicine? Do not take this medicine with any of the following medications: -disulfiram -metronidazole This medicine may also interact with the following medications: -cyclosporine -diazepam -ketoconazole -medicines to increase blood counts like filgrastim, pegfilgrastim, sargramostim -other chemotherapy drugs like cisplatin, doxorubicin,  epirubicin, etoposide, teniposide, vincristine -quinidine -testosterone -vaccines -verapamil Talk to your doctor or health care professional before taking any of these medicines: -acetaminophen -aspirin -ibuprofen -ketoprofen -naproxen This list may not describe all possible interactions. Give your health care provider a list of all the medicines, herbs, non-prescription drugs, or dietary supplements you use. Also tell them if you smoke, drink alcohol, or use illegal drugs. Some items may interact with your medicine. What should I watch for while using this medicine? Your condition will be monitored carefully while you are receiving this medicine. You will need important blood work done while you are taking this medicine. This drug may make you feel generally unwell. This is not uncommon, as chemotherapy can affect healthy cells as well as cancer cells. Report any side effects. Continue your course of treatment even though you feel ill unless your doctor tells you to stop. In some cases, you may be given additional medicines to help with side effects. Follow all directions for their use. Call your doctor or health care professional for advice if you get a fever, chills or sore throat, or other symptoms of a cold or flu. Do not treat yourself. This drug decreases your body's ability to fight infections. Try to avoid being around people who are sick. This medicine may increase your risk to bruise or bleed. Call your doctor or health care professional if you notice any unusual bleeding. Be careful brushing and flossing your teeth or using a toothpick because you may get an infection or bleed more easily. If you have any dental work done, tell your dentist you are receiving this medicine. Avoid taking products that contain aspirin, acetaminophen, ibuprofen, naproxen, or ketoprofen unless instructed by your doctor. These medicines may hide a fever.   Do not become pregnant while taking this medicine.  Women should inform their doctor if they wish to become pregnant or think they might be pregnant. There is a potential for serious side effects to an unborn child. Talk to your health care professional or pharmacist for more information. Do not breast-feed an infant while taking this medicine. Men are advised not to father a child while receiving this medicine. What side effects may I notice from receiving this medicine? Side effects that you should report to your doctor or health care professional as soon as possible: -allergic reactions like skin rash, itching or hives, swelling of the face, lips, or tongue -low blood counts - This drug may decrease the number of white blood cells, red blood cells and platelets. You may be at increased risk for infections and bleeding. -signs of infection - fever or chills, cough, sore throat, pain or difficulty passing urine -signs of decreased platelets or bleeding - bruising, pinpoint red spots on the skin, black, tarry stools, nosebleeds -signs of decreased red blood cells - unusually weak or tired, fainting spells, lightheadedness -breathing problems -chest pain -high or low blood pressure -mouth sores -nausea and vomiting -pain, swelling, redness or irritation at the injection site -pain, tingling, numbness in the hands or feet -slow or irregular heartbeat -swelling of the ankle, feet, hands Side effects that usually do not require medical attention (report to your doctor or health care professional if they continue or are bothersome): -bone pain -complete hair loss including hair on your head, underarms, pubic hair, eyebrows, and eyelashes -changes in the color of fingernails -diarrhea -loosening of the fingernails -loss of appetite -muscle or joint pain -red flush to skin -sweating This list may not describe all possible side effects. Call your doctor for medical advice about side effects. You may report side effects to FDA at  1-800-FDA-1088. Where should I keep my medicine? This drug is given in a hospital or clinic and will not be stored at home. NOTE: This sheet is a summary. It may not cover all possible information. If you have questions about this medicine, talk to your doctor, pharmacist, or health care provider.  2015, Elsevier/Gold Standard. (2012-02-27 16:41:21)  

## 2014-01-16 NOTE — Progress Notes (Signed)
To provide support and encouragement, care continuity and to assess for needs, met with patient during appt with Dr. Alvy Bimler: 1. He verbalized understanding of new start Taxol regime presented by Dr. Alvy Bimler.  I provided him information re: Taxol. 2. He indicated he continues to work. 3. At his request, I obtained Advanced Directives packet for him, explained that Polo Riley, LCSW, will assist with completion.  Per Ander Purpura, I suggested he invite his neighbor who is to be HCPOA when documents are completed. 4. He indicated he has not had further conversation with Wadie Lessen, Palliative Care.  He agreed for me to notify her on his behalf. He understands he can contact me with questions/concerns as he proceeds with this next phase of tmt.  Gayleen Orem, RN, BSN, [facility] [phone]

## 2014-01-23 ENCOUNTER — Ambulatory Visit (HOSPITAL_BASED_OUTPATIENT_CLINIC_OR_DEPARTMENT_OTHER): Payer: BLUE CROSS/BLUE SHIELD

## 2014-01-23 DIAGNOSIS — Z5111 Encounter for antineoplastic chemotherapy: Secondary | ICD-10-CM

## 2014-01-23 DIAGNOSIS — C109 Malignant neoplasm of oropharynx, unspecified: Secondary | ICD-10-CM

## 2014-01-23 MED ORDER — FAMOTIDINE IN NACL 20-0.9 MG/50ML-% IV SOLN
20.0000 mg | Freq: Once | INTRAVENOUS | Status: AC
  Administered 2014-01-23: 20 mg via INTRAVENOUS

## 2014-01-23 MED ORDER — PACLITAXEL CHEMO INJECTION 300 MG/50ML
80.0000 mg/m2 | Freq: Once | INTRAVENOUS | Status: AC
  Administered 2014-01-23: 126 mg via INTRAVENOUS
  Filled 2014-01-23: qty 21

## 2014-01-23 MED ORDER — DEXAMETHASONE SODIUM PHOSPHATE 20 MG/5ML IJ SOLN
20.0000 mg | Freq: Once | INTRAMUSCULAR | Status: AC
  Administered 2014-01-23: 20 mg via INTRAVENOUS

## 2014-01-23 MED ORDER — HEPARIN SOD (PORK) LOCK FLUSH 100 UNIT/ML IV SOLN
500.0000 [IU] | Freq: Once | INTRAVENOUS | Status: AC | PRN
  Administered 2014-01-23: 500 [IU]
  Filled 2014-01-23: qty 5

## 2014-01-23 MED ORDER — SODIUM CHLORIDE 0.9 % IJ SOLN
10.0000 mL | INTRAMUSCULAR | Status: DC | PRN
  Administered 2014-01-23: 10 mL
  Filled 2014-01-23: qty 10

## 2014-01-23 MED ORDER — DEXAMETHASONE SODIUM PHOSPHATE 20 MG/5ML IJ SOLN
INTRAMUSCULAR | Status: AC
  Filled 2014-01-23: qty 5

## 2014-01-23 MED ORDER — SODIUM CHLORIDE 0.9 % IV SOLN
Freq: Once | INTRAVENOUS | Status: AC
  Administered 2014-01-23: 09:00:00 via INTRAVENOUS

## 2014-01-23 MED ORDER — FAMOTIDINE IN NACL 20-0.9 MG/50ML-% IV SOLN
INTRAVENOUS | Status: AC
  Filled 2014-01-23: qty 50

## 2014-01-23 MED ORDER — DIPHENHYDRAMINE HCL 50 MG/ML IJ SOLN
50.0000 mg | Freq: Once | INTRAMUSCULAR | Status: AC
  Administered 2014-01-23: 50 mg via INTRAVENOUS

## 2014-01-23 MED ORDER — DIPHENHYDRAMINE HCL 50 MG/ML IJ SOLN
INTRAMUSCULAR | Status: AC
  Filled 2014-01-23: qty 1

## 2014-01-23 NOTE — Patient Instructions (Addendum)
[facility] Discharge Instructions for Patients Receiving Chemotherapy  Today you received the following chemotherapy agents: Taxol.  To help prevent nausea and vomiting after your treatment, we encourage you to take your nausea medication:  Compazine 10 mg every 6 hours as needed.   If you develop nausea and vomiting that is not controlled by your nausea medication, call the clinic.   BELOW ARE SYMPTOMS THAT SHOULD BE REPORTED IMMEDIATELY:  *FEVER GREATER THAN 100.5 F  *CHILLS WITH OR WITHOUT FEVER  NAUSEA AND VOMITING THAT IS NOT CONTROLLED WITH YOUR NAUSEA MEDICATION  *UNUSUAL SHORTNESS OF BREATH  *UNUSUAL BRUISING OR BLEEDING  TENDERNESS IN MOUTH AND THROAT WITH OR WITHOUT PRESENCE OF ULCERS  *URINARY PROBLEMS  *BOWEL PROBLEMS  UNUSUAL RASH Items with * indicate a potential emergency and should be followed up as soon as possible.  Feel free to call the clinic you have any questions or concerns. The clinic phone number is [phone].   Paclitaxel injection What is this medicine? PACLITAXEL (PAK li TAX el) is a chemotherapy drug. It targets fast dividing cells, like cancer cells, and causes these cells to die. This medicine is used to treat ovarian cancer, breast cancer, and other cancers. This medicine may be used for other purposes; ask your health care provider or pharmacist if you have questions. COMMON BRAND NAME(S): Onxol, Taxol What should I tell my health care provider before I take this medicine? They need to know if you have any of these conditions: -blood disorders -irregular heartbeat -infection (especially a virus infection such as chickenpox, cold sores, or herpes) -liver disease -previous or ongoing radiation therapy -an unusual or allergic reaction to paclitaxel, alcohol, polyoxyethylated castor oil, other chemotherapy agents, other medicines, foods, dyes, or preservatives -pregnant or trying to get pregnant -breast-feeding How  should I use this medicine? This drug is given as an infusion into a vein. It is administered in a hospital or clinic by a specially trained health care professional. Talk to your pediatrician regarding the use of this medicine in children. Special care may be needed. Overdosage: If you think you have taken too much of this medicine contact a poison control center or emergency room at once. NOTE: This medicine is only for you. Do not share this medicine with others. What if I miss a dose? It is important not to miss your dose. Call your doctor or health care professional if you are unable to keep an appointment. What may interact with this medicine? Do not take this medicine with any of the following medications: -disulfiram -metronidazole This medicine may also interact with the following medications: -cyclosporine -diazepam -ketoconazole -medicines to increase blood counts like filgrastim, pegfilgrastim, sargramostim -other chemotherapy drugs like cisplatin, doxorubicin, epirubicin, etoposide, teniposide, vincristine -quinidine -testosterone -vaccines -verapamil Talk to your doctor or health care professional before taking any of these medicines: -acetaminophen -aspirin -ibuprofen -ketoprofen -naproxen This list may not describe all possible interactions. Give your health care provider a list of all the medicines, herbs, non-prescription drugs, or dietary supplements you use. Also tell them if you smoke, drink alcohol, or use illegal drugs. Some items may interact with your medicine. What should I watch for while using this medicine? Your condition will be monitored carefully while you are receiving this medicine. You will need important blood work done while you are taking this medicine. This drug may make you feel generally unwell. This is not uncommon, as chemotherapy can affect healthy cells as well as cancer cells. Report any  side effects. Continue your course of treatment even  though you feel ill unless your doctor tells you to stop. In some cases, you may be given additional medicines to help with side effects. Follow all directions for their use. Call your doctor or health care professional for advice if you get a fever, chills or sore throat, or other symptoms of a cold or flu. Do not treat yourself. This drug decreases your body's ability to fight infections. Try to avoid being around people who are sick. This medicine may increase your risk to bruise or bleed. Call your doctor or health care professional if you notice any unusual bleeding. Be careful brushing and flossing your teeth or using a toothpick because you may get an infection or bleed more easily. If you have any dental work done, tell your dentist you are receiving this medicine. Avoid taking products that contain aspirin, acetaminophen, ibuprofen, naproxen, or ketoprofen unless instructed by your doctor. These medicines may hide a fever. Do not become pregnant while taking this medicine. Women should inform their doctor if they wish to become pregnant or think they might be pregnant. There is a potential for serious side effects to an unborn child. Talk to your health care professional or pharmacist for more information. Do not breast-feed an infant while taking this medicine. Men are advised not to father a child while receiving this medicine. What side effects may I notice from receiving this medicine? Side effects that you should report to your doctor or health care professional as soon as possible: -allergic reactions like skin rash, itching or hives, swelling of the face, lips, or tongue -low blood counts - This drug may decrease the number of white blood cells, red blood cells and platelets. You may be at increased risk for infections and bleeding. -signs of infection - fever or chills, cough, sore throat, pain or difficulty passing urine -signs of decreased platelets or bleeding - bruising, pinpoint  red spots on the skin, black, tarry stools, nosebleeds -signs of decreased red blood cells - unusually weak or tired, fainting spells, lightheadedness -breathing problems -chest pain -high or low blood pressure -mouth sores -nausea and vomiting -pain, swelling, redness or irritation at the injection site -pain, tingling, numbness in the hands or feet -slow or irregular heartbeat -swelling of the ankle, feet, hands Side effects that usually do not require medical attention (report to your doctor or health care professional if they continue or are bothersome): -bone pain -complete hair loss including hair on your head, underarms, pubic hair, eyebrows, and eyelashes -changes in the color of fingernails -diarrhea -loosening of the fingernails -loss of appetite -muscle or joint pain -red flush to skin -sweating This list may not describe all possible side effects. Call your doctor for medical advice about side effects. You may report side effects to FDA at 1-800-FDA-1088. Where should I keep my medicine? This drug is given in a hospital or clinic and will not be stored at home. NOTE: This sheet is a summary. It may not cover all possible information. If you have questions about this medicine, talk to your doctor, pharmacist, or health care provider.  2015, Elsevier/Gold Standard. (2012-02-27 16:41:21)

## 2014-01-24 ENCOUNTER — Ambulatory Visit: Payer: BLUE CROSS/BLUE SHIELD | Attending: Radiation Oncology

## 2014-01-24 ENCOUNTER — Ambulatory Visit
Admission: RE | Admit: 2014-01-24 | Discharge: 2014-01-24 | Disposition: A | Discharge status: Home / Self Care | Payer: BLUE CROSS/BLUE SHIELD | Source: Ambulatory Visit | Attending: Radiation Oncology | Admitting: Radiation Oncology

## 2014-01-24 ENCOUNTER — Telehealth: Payer: Self-pay | Admitting: *Deleted

## 2014-01-24 DIAGNOSIS — R1310 Dysphagia, unspecified: Secondary | ICD-10-CM | POA: Diagnosis not present

## 2014-01-24 DIAGNOSIS — Z79899 Other long term (current) drug therapy: Secondary | ICD-10-CM | POA: Insufficient documentation

## 2014-01-24 DIAGNOSIS — C01 Malignant neoplasm of base of tongue: Secondary | ICD-10-CM

## 2014-01-24 DIAGNOSIS — L599 Disorder of the skin and subcutaneous tissue related to radiation, unspecified: Secondary | ICD-10-CM | POA: Diagnosis not present

## 2014-01-24 DIAGNOSIS — F17210 Nicotine dependence, cigarettes, uncomplicated: Secondary | ICD-10-CM | POA: Diagnosis not present

## 2014-01-24 MED ORDER — BIAFINE EX EMUL
Freq: Two times a day (BID) | CUTANEOUS | Status: DC
  Administered 2014-01-24: 11:00:00 via TOPICAL

## 2014-01-24 NOTE — Progress Notes (Addendum)
Pain Status: denies, Takes Morphine 3-4 tabs daily.  Weight changes, if any: fluctuates between 107- 111 lb  Nutritional Status a) intake: drinks Boost Plus 6 cans daily, eats soft foods without difficulty b) using a feeding tube?: no c) weight changes, if any: no  Swallowing Status: "on scale of 1-10 it is 8", Biotene prn for dry mouth, thick white saliva, does not like to use water w/salt-bkg soda  Dental (if applicable): When was last visit with dentistry:  Prior to tx, no FU scheduled, wants dentures,  Using fluoride trays daily? no   When was last ENT visit? Prior to chemo/ radiation   When is next ENT visit? None scheduled  Other notable issues, if any: 'thick saliva and no teeth" . patient stopped applying lotion to right neck stating "it keeps the skin from coming off". He has large amount dark skin/scabbibng on right neck. Advised he may try to soak area with warm salt water to help slough off.  Patient states he "smokes one cigarette a week".

## 2014-01-24 NOTE — Patient Instructions (Signed)
You must complete exercises x2-3/day in order to give yourself best opportunity for no or minimal muscle hardening leading to difficulty swallowing Complete exercises x2-3/day until [facility] Day, then 3x/week after that time  Take SMALL SIPS

## 2014-01-24 NOTE — Progress Notes (Signed)
Radiation Oncology         [phone]) [phone] ________________________________  Name: Derek Morgan MRN: [id number]  Date: 01/24/2014  DOB: May 11, 1955  Follow-Up Visit Note  CC: No PCP Per Patient  Ascencion Dike, MD  Diagnosis and Prior Radiotherapy:       ICD-9-CM ICD-10-CM   1. 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     Pain Status: Still has pain in right neck but mass is much smaller now. No cervical spine pain. Takes Morphine 3-4 tabs daily.  Weight changes, if any: fluctuates between 107- 111 lb  Nutritional Status a) intake: drinks Boost Plus 6 cans daily, eats soft foods without difficulty b) using a feeding tube?: no c) weight changes, if any: no  Swallowing Status: "on scale of 1-10 it is 8", Biotene prn for dry mouth, thick white saliva, does not like to use water w/salt-bkg soda   Other notable issues, if any: 'thick saliva and no teeth" .  Patient states he "smokes one cigarette a week".                   ALLERGIES:  has No Known Allergies.  Meds: Current Outpatient Prescriptions  Medication Sig Dispense Refill  . Diphenhyd-Lidocaine-Nystatin (FIRST-BXN MOUTHWASH) SUSP Take 5 mLs by mouth 4 (four) times daily. Verbal order from Dr. Alvy Bimler for these ingredients plus Maalox in a 1:1:1:1 concentration.  Called to [employer] for Quantity Sufficient. 1 Bottle 2  . docusate sodium (COLACE) 100 MG capsule Take 1 capsule (100 mg total) by mouth 2 (two) times daily. 60 capsule 0  . lidocaine-prilocaine (EMLA) cream Apply 1 application topically as needed. 30 g 6  .  morphine (MSIR) 30 MG tablet Take 1 tablet (30 mg total) by mouth every 6 (six) hours as needed for severe pain. 90 tablet 0  . Ondansetron HCl (ZOFRAN PO) Take by mouth as needed.    . Prochlorperazine Maleate (COMPAZINE PO) Take by mouth as needed.     Current Facility-Administered Medications  Medication Dose Route Frequency Provider Last Rate Last Dose  . topical emolient (BIAFINE) emulsion   Topical BID Eppie Gibson, MD        Physical Findings: The patient is in no acute distress. Patient is alert and oriented.  weight is 109 lb 1.6 oz (49.487 kg). His oral temperature is 97.7 F (36.5 C). His blood pressure is 144/81 and his pulse is 81. His respiration is 20. Marland Kitchen  Oropharyngeal mucosa is intact with no thrush or lesions.  Sputum thick. He has large amount dark skin/scabbing on right neck.  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  EXAM:  NUCLEAR MEDICINE PET SKULL BASE TO THIGH  TECHNIQUE: 5.9 mCi F-18 FDG was injected intravenously. Full-ring PET imaging was performed from the skull base to thigh after the radiotracer. CT data was obtained and used for attenuation correction and anatomic localization.  FASTING BLOOD GLUCOSE:  Value: 127 mg/dl  COMPARISON:  11/14/2013  FINDINGS: NECK  Large right upper neck mass has decreased in size, currently measuring 6.4 x 5.8 cm compared to 7.1 x 6.6 cm previously. This mass shows decreased metabolic activity, with an SUV max currently measuring 4.1 compared to 7.7 previously. This mass again appears to abut the right skullbase and involve the right lateral  mass of C1 vertebra. No other hypermetabolic cervical lymph nodes identified.  CHEST  Diffuse bilateral hypermetabolic pulmonary metastases show increased in size and number since previous study. Index nodule in the posterior right lower lobe currently measures 2.0 cm on image 74/series 8 compared to 1.1 cm previously. SUV max now measures 5.2 compared to 3.9 previously. No hypermetabolic hilar or mediastinal lymph nodes identified on today's exam.  ABDOMEN/PELVIS  No abnormal hypermetabolic activity within the liver, pancreas, adrenal glands, or spleen. No hypermetabolic lymph nodes in the abdomen or pelvis.  SKELETON  Decreased FDG uptake is seen within the left C5 vertebra, with SUV max currently measuring 3.3 compared to 5.3 previously. No other hypermetabolic bone metastases identified.  IMPRESSION: Partial metabolic response to therapy of large right upper neck mass and C5 vertebral bone metastasis.  Interval progression of diffuse bilateral pulmonary metastases.  No evidence of abdominal or pelvic metastatic disease.   Electronically Signed   By: Earle Gell M.D.   On: 01/15/2014 10:42    Impression/Plan:     Head and Neck Cancer Status: good palliative effect with tumor shrinkage   He has large amount dark skin/scabbibng on right neck. Advised he may try to soak area with warm salt water to help slough off. Biafine refilled at his request.   He will continue to follow with Dr. Alvy Bimler as he receives chemotherapy.  I will see him back on a PRN basis. He is pleased with this plan.   _____________________________________   Eppie Gibson, MD

## 2014-01-24 NOTE — Telephone Encounter (Signed)
No adverse effect from his chemo treatment. Was at work at his usual activity level. Confirms he has his Compazine and Zofran if needed.

## 2014-01-24 NOTE — Therapy (Signed)
[facility] [address] [place], [place], [address] Phone: [phone]   Fax:  [phone]  Speech Language Pathology Treatment  Patient Details  Name: Derek Morgan MRN: [id number] Date of Birth: 05-17-1955 Referring Provider:  Eppie Gibson, MD  Encounter Date: 01/24/2014      End of Session - 01/24/14 1002    Visit Number 4   Number of Visits 6   Date for SLP Re-Evaluation 05/25/14   SLP Start Time 0852   SLP Stop Time  0926   SLP Time Calculation (min) 34 min      Past Medical History  Diagnosis Date  . Heavy smoker   . Hoarseness of voice   . Mass in neck 04/2013    right/ Metastatic Squamous Cell Carcinoma  . Loose, teeth 06/24/2013  . Weight loss 06/17/2013  . Throat pain 06/17/2013  . Shortness of breath     with exertion at times  . Cancer     oral, pharyngeal  . History of radiation therapy 11/26/13- 12/18/13    right neck mass/C5 lesion 37.5 Gy 15 fractions    Past Surgical History  Procedure Laterality Date  . Broken leg  1998    orif ankle-left-plated  . Direct laryngoscopy N/A 05/22/2013    Procedure: DIRECT LARYNGOSCOPY;  Surgeon: Ascencion Dike, MD;  Location: [facility] OR;  Service: ENT;  Laterality: N/A;  . Tonsillectomy Right 05/22/2013    Procedure: RIGHT TONSILLECTOMY;  Surgeon: Ascencion Dike, MD;  Location: [facility];  Service: ENT;  Laterality: Right;  . Mass biopsy Right 05/22/2013    Procedure:  INCISIONAL BIOPSY NECK MASS;  Surgeon: Ascencion Dike, MD;  Location: [facility];  Service: ENT;  Laterality: Right;  . Mass biopsy Right 06/07/2013    Procedure: EXCISIONAL BIOPSY OF RIGHT NECK MASS;  Surgeon: Ascencion Dike, MD;  Location: [place] [facility];  Service: ENT;  Laterality: Right;  . Multiple extractions with alveoloplasty N/A 06/24/2013    Procedure: Extraction of tooth #'s 2,3,6,8,9,11,12,13,14,15,19,20,22,23,24,25,26,27,28 with alveoloplasty.;  Surgeon: Lenn Cal, DDS;  Location: [facility];  Service: Oral Surgery;   Laterality: N/A;  . Portacath placement Left 07/04/2013    Procedure: INSERTION PORT-A-CATH;  Surgeon: Merrie Roof, MD;  Location: [facility];  Service: General;  Laterality: Left;    There were no vitals taken for this visit.  Visit Diagnosis: Dysphagia      Subjective Assessment - 01/24/14 0933    Symptoms Pt reports he ate fliet mignon last weekend. Finished rad tx three weeks ago - beginning palliative chemo due to mets to lungs. Pt reportedly completing exercises approx. x3/week since rad completed. Reported he was performing approx 2-3x/day during rad tx.             ADULT SLP TREATMENT - 01/24/14 0911    General Information   Behavior/Cognition Alert;Cooperative;Pleasant mood   HPI Pt reported he completed rad tx first week December 2015.    Treatment Provided   Treatment provided Dysphagia   Dysphagia Treatment   Oral Cavity - Dentition Edentulous   Treatment Methods Skilled observation;Therapeutic exercise;Compensation strategy training;Patient/caregiver education   Patient observed directly with PO's Yes   Type of PO's observed Dysphagia 3 (soft);Thin liquids   Pharyngeal Phase Signs & Symptoms Immediate cough   Type of cueing Verbal  pt was told take smaller sips; when he did so, no cough x10   Other treatment/comments SLP assessed pt's success with HEP. Max A needed with  [place], mod A with chin resist, and had difficulty initiating swallow with [place]. Question if pt has completed HEP as much as reported based on success today. Pt took smaller sips with H2O when with POs than with HEP. No coughing noted.   Pain Assessment   Pain Assessment No/denies pain   Assessment / Recommendations / Plan   Plan Continue with current plan of care   Dysphagia Recommendations   Diet recommendations Thin liquid  diet as tolerated with SMALL SIPS   Progression Toward Goals   Progression toward goals Not progressing toward goals (comment)  Question if pt has completed HEP as  he reports.          SLP Education - 01/24/14 1001    Education provided Yes   Education Details HEP, effects of not completing HEP, HEP frequency, need for small sips   Person(s) Educated Patient   Methods Explanation;Demonstration;Verbal cues   Comprehension Verbalized understanding;Returned demonstration;Need further instruction          SLP Short Term Goals - 01/24/14 0949    SLP SHORT TERM GOAL #1   Title pt will complete HEP with rare min A (03-25-14)   Time 2   Period --  visits   Status On-going  Extended    SLP SHORT TERM GOAL #2   Title pt will tell SLP why he is completing HEP    Time 2   Period --  visits   Status Achieved  08-26-13          SLP Long Term Goals - 01/24/14 0951    SLP LONG TERM GOAL #1   Title copmlete HEP with modified independence (05-25-14)   Time 4   Period --  visits   Status On-going  extended   SLP LONG TERM GOAL #2   Title tell SLP three s/s aspiration PNA (05-25-14)   Time 4   Period --  visits   Status On-going  extended   SLP LONG TERM GOAL #3   Title tell SLP why a food journal could benefit pt during return to PO diet after head/neck rad (05-25-14)   Time 4   Period --  visits   Status On-going  extended          Plan - 01/24/14 1003    Clinical Impression Statement Pt presents today with questionable completion of HEP as reported. SLP had to review approx half of pt's HEP with him for correct procedure. Pt cont to smoke cigarettes.   Speech Therapy Frequency Monthly   Duration --  4 more visits   Treatment/Interventions Aspiration precaution training;Pharyngeal strengthening exercises;Oral motor exercises;SLP instruction and feedback;Diet toleration management by SLP   Potential to Achieve Goals Good   Potential Considerations Cooperation/participation level   SLP Home Exercise Plan reviewed with pt today - he agreed to complete x2-3/day until [facility] Day then x3/week and knows rationale for this.   Consulted  and Agree with Plan of Care Patient        Problem List Patient Active Problem List   Diagnosis Date Noted  . DNR (do not resuscitate) discussion 12/23/2013  . Weakness generalized 12/23/2013  . Mucositis due to antineoplastic therapy 12/05/2013  . 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 Leukopenia due to antineoplastic chemotherapy 08/22/2013  . Thrombocytopenia due to drugs 08/22/2013  . Skin rash 08/01/2013  . Dental caries 06/24/2013  . Weight loss 06/17/2013  .  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 Tobacco abuse 06/17/2013  . Throat pain 06/17/2013    [facility], SLP 01/24/2014, 10:07 AM  [facility] [address] [place], [place], [address] Phone: [phone]   Fax:  [phone]

## 2014-01-30 ENCOUNTER — Ambulatory Visit: Payer: BLUE CROSS/BLUE SHIELD | Admitting: Nutrition

## 2014-01-30 ENCOUNTER — Encounter: Payer: Self-pay | Admitting: *Deleted

## 2014-01-30 ENCOUNTER — Other Ambulatory Visit (HOSPITAL_BASED_OUTPATIENT_CLINIC_OR_DEPARTMENT_OTHER): Payer: BLUE CROSS/BLUE SHIELD

## 2014-01-30 ENCOUNTER — Ambulatory Visit (HOSPITAL_BASED_OUTPATIENT_CLINIC_OR_DEPARTMENT_OTHER): Payer: BLUE CROSS/BLUE SHIELD

## 2014-01-30 DIAGNOSIS — C109 Malignant neoplasm of oropharynx, unspecified: Secondary | ICD-10-CM

## 2014-01-30 DIAGNOSIS — Z5111 Encounter for antineoplastic chemotherapy: Secondary | ICD-10-CM

## 2014-01-30 LAB — CBC WITH DIFFERENTIAL/PLATELET
BASO%: 1 % (ref 0.0–2.0)
Basophils Absolute: 0.1 10*3/uL (ref 0.0–0.1)
EOS%: 2 % (ref 0.0–7.0)
Eosinophils Absolute: 0.1 10*3/uL (ref 0.0–0.5)
HCT: 34.2 % — ABNORMAL LOW (ref 38.4–49.9)
HGB: 10.6 g/dL — ABNORMAL LOW (ref 13.0–17.1)
LYMPH%: 12.3 % — ABNORMAL LOW (ref 14.0–49.0)
MCH: 29.5 pg (ref 27.2–33.4)
MCHC: 30.8 g/dL — ABNORMAL LOW (ref 32.0–36.0)
MCV: 95.7 fL (ref 79.3–98.0)
MONO#: 0.6 10*3/uL (ref 0.1–0.9)
MONO%: 12.1 % (ref 0.0–14.0)
NEUT#: 3.6 10*3/uL (ref 1.5–6.5)
NEUT%: 72.6 % (ref 39.0–75.0)
Platelets: 255 10*3/uL (ref 140–400)
RBC: 3.57 10*6/uL — ABNORMAL LOW (ref 4.20–5.82)
RDW: 24.1 % — ABNORMAL HIGH (ref 11.0–14.6)
WBC: 5 10*3/uL (ref 4.0–10.3)
lymph#: 0.6 10*3/uL — ABNORMAL LOW (ref 0.9–3.3)

## 2014-01-30 LAB — COMPREHENSIVE METABOLIC PANEL (CC13)
ALT: 24 U/L (ref 0–55)
AST: 29 U/L (ref 5–34)
Albumin: 3.4 g/dL — ABNORMAL LOW (ref 3.5–5.0)
Alkaline Phosphatase: 74 U/L (ref 40–150)
Anion Gap: 8 mEq/L (ref 3–11)
BUN: 8.6 mg/dL (ref 7.0–26.0)
CHLORIDE: 101 meq/L (ref 98–109)
CO2: 28 meq/L (ref 22–29)
CREATININE: 0.7 mg/dL (ref 0.7–1.3)
Calcium: 9.3 mg/dL (ref 8.4–10.4)
EGFR: >90 mL/min/{1.73_m2} (ref >90)
GLUCOSE: 116 mg/dL (ref 70–140)
POTASSIUM: 4.4 meq/L (ref 3.5–5.1)
SODIUM: 137 meq/L (ref 136–145)
Total Bilirubin: 0.2 mg/dL (ref 0.20–1.20)
Total Protein: 8.2 g/dL (ref 6.4–8.3)

## 2014-01-30 MED ORDER — SODIUM CHLORIDE 0.9 % IJ SOLN
10.0000 mL | INTRAMUSCULAR | Status: DC | PRN
  Administered 2014-01-30: 10 mL
  Filled 2014-01-30: qty 10

## 2014-01-30 MED ORDER — DIPHENHYDRAMINE HCL 50 MG/ML IJ SOLN
INTRAMUSCULAR | Status: AC
  Filled 2014-01-30: qty 1

## 2014-01-30 MED ORDER — HEPARIN SOD (PORK) LOCK FLUSH 100 UNIT/ML IV SOLN
500.0000 [IU] | Freq: Once | INTRAVENOUS | Status: AC | PRN
  Administered 2014-01-30: 500 [IU]
  Filled 2014-01-30: qty 5

## 2014-01-30 MED ORDER — FAMOTIDINE IN NACL 20-0.9 MG/50ML-% IV SOLN
INTRAVENOUS | Status: AC
  Filled 2014-01-30: qty 50

## 2014-01-30 MED ORDER — DEXAMETHASONE SODIUM PHOSPHATE 20 MG/5ML IJ SOLN
INTRAMUSCULAR | Status: AC
  Filled 2014-01-30: qty 5

## 2014-01-30 MED ORDER — DEXAMETHASONE SODIUM PHOSPHATE 20 MG/5ML IJ SOLN
20.0000 mg | Freq: Once | INTRAMUSCULAR | Status: AC
  Administered 2014-01-30: 20 mg via INTRAVENOUS

## 2014-01-30 MED ORDER — FAMOTIDINE IN NACL 20-0.9 MG/50ML-% IV SOLN
20.0000 mg | Freq: Once | INTRAVENOUS | Status: AC
  Administered 2014-01-30: 20 mg via INTRAVENOUS

## 2014-01-30 MED ORDER — DIPHENHYDRAMINE HCL 50 MG/ML IJ SOLN
50.0000 mg | Freq: Once | INTRAMUSCULAR | Status: AC
  Administered 2014-01-30: 50 mg via INTRAVENOUS

## 2014-01-30 MED ORDER — PACLITAXEL CHEMO INJECTION 300 MG/50ML
80.0000 mg/m2 | Freq: Once | INTRAVENOUS | Status: AC
  Administered 2014-01-30: 126 mg via INTRAVENOUS
  Filled 2014-01-30: qty 21

## 2014-01-30 MED ORDER — SODIUM CHLORIDE 0.9 % IV SOLN
Freq: Once | INTRAVENOUS | Status: AC
  Administered 2014-01-30: 10:00:00 via INTRAVENOUS

## 2014-01-30 NOTE — Progress Notes (Signed)
To provide support and encouragement, care continuity and to assess for needs, met briefly with patient during his infusion.  Subjectively, he was upbeat, spoke with a positive outlook.  He did not express any needs or concerns at this time, I encouraged him to contact me if that changes, he verbalized understanding.  Gayleen Orem, RN, BSN, [facility] [phone]

## 2014-01-30 NOTE — Patient Instructions (Signed)
[facility] Discharge Instructions for Patients Receiving Chemotherapy  Today you received the following chemotherapy agents: Taxol.  To help prevent nausea and vomiting after your treatment, we encourage you to take your nausea medication:  Compazine 10 mg every 6 hours as needed.   If you develop nausea and vomiting that is not controlled by your nausea medication, call the clinic.   BELOW ARE SYMPTOMS THAT SHOULD BE REPORTED IMMEDIATELY:  *FEVER GREATER THAN 100.5 F  *CHILLS WITH OR WITHOUT FEVER  NAUSEA AND VOMITING THAT IS NOT CONTROLLED WITH YOUR NAUSEA MEDICATION  *UNUSUAL SHORTNESS OF BREATH  *UNUSUAL BRUISING OR BLEEDING  TENDERNESS IN MOUTH AND THROAT WITH OR WITHOUT PRESENCE OF ULCERS  *URINARY PROBLEMS  *BOWEL PROBLEMS  UNUSUAL RASH Items with * indicate a potential emergency and should be followed up as soon as possible.  Feel free to call the clinic you have any questions or concerns. The clinic phone number is [phone].   Paclitaxel injection What is this medicine? PACLITAXEL (PAK li TAX el) is a chemotherapy drug. It targets fast dividing cells, like cancer cells, and causes these cells to die. This medicine is used to treat ovarian cancer, breast cancer, and other cancers. This medicine may be used for other purposes; ask your health care provider or pharmacist if you have questions. COMMON BRAND NAME(S): Onxol, Taxol What should I tell my health care provider before I take this medicine? They need to know if you have any of these conditions: -blood disorders -irregular heartbeat -infection (especially a virus infection such as chickenpox, cold sores, or herpes) -liver disease -previous or ongoing radiation therapy -an unusual or allergic reaction to paclitaxel, alcohol, polyoxyethylated castor oil, other chemotherapy agents, other medicines, foods, dyes, or preservatives -pregnant or trying to get pregnant -breast-feeding How  should I use this medicine? This drug is given as an infusion into a vein. It is administered in a hospital or clinic by a specially trained health care professional. Talk to your pediatrician regarding the use of this medicine in children. Special care may be needed. Overdosage: If you think you have taken too much of this medicine contact a poison control center or emergency room at once. NOTE: This medicine is only for you. Do not share this medicine with others. What if I miss a dose? It is important not to miss your dose. Call your doctor or health care professional if you are unable to keep an appointment. What may interact with this medicine? Do not take this medicine with any of the following medications: -disulfiram -metronidazole This medicine may also interact with the following medications: -cyclosporine -diazepam -ketoconazole -medicines to increase blood counts like filgrastim, pegfilgrastim, sargramostim -other chemotherapy drugs like cisplatin, doxorubicin, epirubicin, etoposide, teniposide, vincristine -quinidine -testosterone -vaccines -verapamil Talk to your doctor or health care professional before taking any of these medicines: -acetaminophen -aspirin -ibuprofen -ketoprofen -naproxen This list may not describe all possible interactions. Give your health care provider a list of all the medicines, herbs, non-prescription drugs, or dietary supplements you use. Also tell them if you smoke, drink alcohol, or use illegal drugs. Some items may interact with your medicine. What should I watch for while using this medicine? Your condition will be monitored carefully while you are receiving this medicine. You will need important blood work done while you are taking this medicine. This drug may make you feel generally unwell. This is not uncommon, as chemotherapy can affect healthy cells as well as cancer cells. Report any  side effects. Continue your course of treatment even  though you feel ill unless your doctor tells you to stop. In some cases, you may be given additional medicines to help with side effects. Follow all directions for their use. Call your doctor or health care professional for advice if you get a fever, chills or sore throat, or other symptoms of a cold or flu. Do not treat yourself. This drug decreases your body's ability to fight infections. Try to avoid being around people who are sick. This medicine may increase your risk to bruise or bleed. Call your doctor or health care professional if you notice any unusual bleeding. Be careful brushing and flossing your teeth or using a toothpick because you may get an infection or bleed more easily. If you have any dental work done, tell your dentist you are receiving this medicine. Avoid taking products that contain aspirin, acetaminophen, ibuprofen, naproxen, or ketoprofen unless instructed by your doctor. These medicines may hide a fever. Do not become pregnant while taking this medicine. Women should inform their doctor if they wish to become pregnant or think they might be pregnant. There is a potential for serious side effects to an unborn child. Talk to your health care professional or pharmacist for more information. Do not breast-feed an infant while taking this medicine. Men are advised not to father a child while receiving this medicine. What side effects may I notice from receiving this medicine? Side effects that you should report to your doctor or health care professional as soon as possible: -allergic reactions like skin rash, itching or hives, swelling of the face, lips, or tongue -low blood counts - This drug may decrease the number of white blood cells, red blood cells and platelets. You may be at increased risk for infections and bleeding. -signs of infection - fever or chills, cough, sore throat, pain or difficulty passing urine -signs of decreased platelets or bleeding - bruising, pinpoint  red spots on the skin, black, tarry stools, nosebleeds -signs of decreased red blood cells - unusually weak or tired, fainting spells, lightheadedness -breathing problems -chest pain -high or low blood pressure -mouth sores -nausea and vomiting -pain, swelling, redness or irritation at the injection site -pain, tingling, numbness in the hands or feet -slow or irregular heartbeat -swelling of the ankle, feet, hands Side effects that usually do not require medical attention (report to your doctor or health care professional if they continue or are bothersome): -bone pain -complete hair loss including hair on your head, underarms, pubic hair, eyebrows, and eyelashes -changes in the color of fingernails -diarrhea -loosening of the fingernails -loss of appetite -muscle or joint pain -red flush to skin -sweating This list may not describe all possible side effects. Call your doctor for medical advice about side effects. You may report side effects to FDA at 1-800-FDA-1088. Where should I keep my medicine? This drug is given in a hospital or clinic and will not be stored at home. NOTE: This sheet is a summary. It may not cover all possible information. If you have questions about this medicine, talk to your doctor, pharmacist, or health care provider.  2015, [employer]. (2012-02-27 16:41:21)

## 2014-01-30 NOTE — Progress Notes (Signed)
Follow up completed with patient in infusion. Patient reports weight fluctuates and was documented as 109 pounds. Patient requesting Boost coupons and samples. Provided patient with Boost coupons and samples at his request.  **Disclaimer: This note was dictated with voice recognition software. Similar sounding words can inadvertently be transcribed and this note may contain transcription errors which may not have been corrected upon publication of note.**

## 2014-02-06 ENCOUNTER — Encounter: Payer: Self-pay | Admitting: Hematology and Oncology

## 2014-02-06 ENCOUNTER — Other Ambulatory Visit (HOSPITAL_BASED_OUTPATIENT_CLINIC_OR_DEPARTMENT_OTHER): Payer: BLUE CROSS/BLUE SHIELD

## 2014-02-06 ENCOUNTER — Encounter: Payer: Self-pay | Admitting: Pharmacist

## 2014-02-06 ENCOUNTER — Ambulatory Visit (HOSPITAL_BASED_OUTPATIENT_CLINIC_OR_DEPARTMENT_OTHER): Payer: BLUE CROSS/BLUE SHIELD | Admitting: Hematology and Oncology

## 2014-02-06 ENCOUNTER — Telehealth: Payer: Self-pay | Admitting: *Deleted

## 2014-02-06 ENCOUNTER — Telehealth: Payer: Self-pay | Admitting: Hematology and Oncology

## 2014-02-06 ENCOUNTER — Ambulatory Visit (HOSPITAL_BASED_OUTPATIENT_CLINIC_OR_DEPARTMENT_OTHER): Payer: BLUE CROSS/BLUE SHIELD

## 2014-02-06 VITALS — BP 149/73 | HR 101 | Temp 98.4°F | Resp 20 | Ht 73.0 in | Wt 111.2 lb

## 2014-02-06 DIAGNOSIS — C109 Malignant neoplasm of oropharynx, unspecified: Secondary | ICD-10-CM

## 2014-02-06 DIAGNOSIS — R070 Pain in throat: Secondary | ICD-10-CM

## 2014-02-06 DIAGNOSIS — R634 Abnormal weight loss: Secondary | ICD-10-CM

## 2014-02-06 DIAGNOSIS — D630 Anemia in neoplastic disease: Secondary | ICD-10-CM

## 2014-02-06 DIAGNOSIS — Z5111 Encounter for antineoplastic chemotherapy: Secondary | ICD-10-CM

## 2014-02-06 LAB — COMPREHENSIVE METABOLIC PANEL (CC13)
ALBUMIN: 3.5 g/dL (ref 3.5–5.0)
ALT: 26 U/L (ref 0–55)
ANION GAP: 10 meq/L (ref 3–11)
AST: 30 U/L (ref 5–34)
Alkaline Phosphatase: 65 U/L (ref 40–150)
BUN: 8.5 mg/dL (ref 7.0–26.0)
CHLORIDE: 101 meq/L (ref 98–109)
CO2: 28 meq/L (ref 22–29)
CREATININE: 0.6 mg/dL — AB (ref 0.7–1.3)
Calcium: 9.2 mg/dL (ref 8.4–10.4)
EGFR: >90 mL/min/{1.73_m2} (ref >90)
GLUCOSE: 105 mg/dL (ref 70–140)
Potassium: 4 mEq/L (ref 3.5–5.1)
Sodium: 139 mEq/L (ref 136–145)
TOTAL PROTEIN: 7.8 g/dL (ref 6.4–8.3)
Total Bilirubin: 0.33 mg/dL (ref 0.20–1.20)

## 2014-02-06 LAB — CBC WITH DIFFERENTIAL/PLATELET
BASO%: 0.8 % (ref 0.0–2.0)
Basophils Absolute: 0 10*3/uL (ref 0.0–0.1)
EOS%: 1.3 % (ref 0.0–7.0)
Eosinophils Absolute: 0.1 10*3/uL (ref 0.0–0.5)
HEMATOCRIT: 33.7 % — AB (ref 38.4–49.9)
HEMOGLOBIN: 10.4 g/dL — AB (ref 13.0–17.1)
LYMPH%: 16.8 % (ref 14.0–49.0)
MCH: 29.1 pg (ref 27.2–33.4)
MCHC: 30.9 g/dL — ABNORMAL LOW (ref 32.0–36.0)
MCV: 94.4 fL (ref 79.3–98.0)
MONO#: 0.7 10*3/uL (ref 0.1–0.9)
MONO%: 15.6 % — AB (ref 0.0–14.0)
NEUT#: 3.1 10*3/uL (ref 1.5–6.5)
NEUT%: 65.5 % (ref 39.0–75.0)
Platelets: 197 10*3/uL (ref 140–400)
RBC: 3.57 10*6/uL — ABNORMAL LOW (ref 4.20–5.82)
RDW: 21 % — AB (ref 11.0–14.6)
WBC: 4.8 10*3/uL (ref 4.0–10.3)
lymph#: 0.8 10*3/uL — ABNORMAL LOW (ref 0.9–3.3)

## 2014-02-06 MED ORDER — FAMOTIDINE IN NACL 20-0.9 MG/50ML-% IV SOLN
20.0000 mg | Freq: Once | INTRAVENOUS | Status: AC
  Administered 2014-02-06: 20 mg via INTRAVENOUS

## 2014-02-06 MED ORDER — DEXAMETHASONE SODIUM PHOSPHATE 20 MG/5ML IJ SOLN
INTRAMUSCULAR | Status: AC
  Filled 2014-02-06: qty 5

## 2014-02-06 MED ORDER — HEPARIN SOD (PORK) LOCK FLUSH 100 UNIT/ML IV SOLN
500.0000 [IU] | Freq: Once | INTRAVENOUS | Status: AC | PRN
  Administered 2014-02-06: 500 [IU]
  Filled 2014-02-06: qty 5

## 2014-02-06 MED ORDER — SODIUM CHLORIDE 0.9 % IV SOLN
Freq: Once | INTRAVENOUS | Status: AC
  Administered 2014-02-06: 14:00:00 via INTRAVENOUS

## 2014-02-06 MED ORDER — DEXTROSE 5 % IV SOLN
80.0000 mg/m2 | Freq: Once | INTRAVENOUS | Status: AC
  Administered 2014-02-06: 126 mg via INTRAVENOUS
  Filled 2014-02-06: qty 21

## 2014-02-06 MED ORDER — DIPHENHYDRAMINE HCL 50 MG/ML IJ SOLN
INTRAMUSCULAR | Status: AC
  Filled 2014-02-06: qty 1

## 2014-02-06 MED ORDER — DIPHENHYDRAMINE HCL 50 MG/ML IJ SOLN
50.0000 mg | Freq: Once | INTRAMUSCULAR | Status: AC
  Administered 2014-02-06: 50 mg via INTRAVENOUS

## 2014-02-06 MED ORDER — DEXAMETHASONE SODIUM PHOSPHATE 20 MG/5ML IJ SOLN
20.0000 mg | Freq: Once | INTRAMUSCULAR | Status: AC
Start: 2014-02-06 — End: 2014-02-06
  Administered 2014-02-06: 20 mg via INTRAVENOUS

## 2014-02-06 MED ORDER — FAMOTIDINE IN NACL 20-0.9 MG/50ML-% IV SOLN
INTRAVENOUS | Status: AC
  Filled 2014-02-06: qty 50

## 2014-02-06 MED ORDER — SODIUM CHLORIDE 0.9 % IJ SOLN
10.0000 mL | INTRAMUSCULAR | Status: DC | PRN
  Administered 2014-02-06: 10 mL
  Filled 2014-02-06: qty 10

## 2014-02-06 NOTE — Assessment & Plan Note (Signed)
His pain appeared to be well controlled with current prescription of morphine. I will refill his prescription in 2 weeks.

## 2014-02-06 NOTE — Patient Instructions (Signed)
[facility] Discharge Instructions for Patients Receiving Chemotherapy  Today you received the following chemotherapy agents: Taxol.  To help prevent nausea and vomiting after your treatment, we encourage you to take your nausea medication as directed and take it as often as prescribed for the next several days as needed.   If you develop nausea and vomiting that is not controlled by your nausea medication, call the clinic. If it is after clinic hours your family physician or the after hours number for the clinic or go to the Emergency Department.   BELOW ARE SYMPTOMS THAT SHOULD BE REPORTED IMMEDIATELY:  *FEVER GREATER THAN 101.0 F  *CHILLS WITH OR WITHOUT FEVER  NAUSEA AND VOMITING THAT IS NOT CONTROLLED WITH YOUR NAUSEA MEDICATION  *UNUSUAL SHORTNESS OF BREATH  *UNUSUAL BRUISING OR BLEEDING  TENDERNESS IN MOUTH AND THROAT WITH OR WITHOUT PRESENCE OF ULCERS  *URINARY PROBLEMS  *BOWEL PROBLEMS  UNUSUAL RASH Items with * indicate a potential emergency and should be followed up as soon as possible.  One of the nurses will contact you 24 hours after your treatment. Please let the nurse know about any problems that you may have experienced. Feel free to call the clinic you have any questions or concerns. The clinic phone number is [phone].   I have been informed and understand all the instructions given to me. I know to contact the clinic, my physician, or go to the Emergency Department if any problems should occur. I do not have any questions at this time, but understand that I may call the clinic during office hours or the Patient Navigator at [phone] should I have any questions or need assistance in obtaining follow up care.    __________________________________________  _____________  __________ Signature of Patient or Authorized Representative            Date                   Time    __________________________________________ Nurse's  Signature

## 2014-02-06 NOTE — Assessment & Plan Note (Signed)
He tolerated Taxol well without side effects. We will proceed with week 3 treatment and he would take 1 week break. I'll see him back in 2 weeks for further assessment

## 2014-02-06 NOTE — Progress Notes (Signed)
[facility] OFFICE PROGRESS NOTE  Patient Care Team: No Pcp Per Patient as PCP - General (General Practice) Heath Lark, MD as Consulting Physician (Hematology and Oncology) Ascencion Dike, MD as Consulting Physician (Otolaryngology) Brooks Sailors, RN as Oncology Nurse Navigator (Oncology) Eppie Gibson, MD as Attending Physician (Radiation Oncology) Karie Mainland, RD as Dietitian (Nutrition)  SUMMARY OF ONCOLOGIC HISTORY: Oncology History   Oropharyngeal cancer, HPV negative   Primary site: Pharynx - Oropharynx   Staging method: AJCC 7th Edition   Clinical: Stage IVC (T4b, N3, M1) signed by Heath Lark, MD on 07/01/2013  8:32 PM   Summary: Stage IVC (T4b, N3, M1)         Oropharyngeal cancer   05/06/2013 Imaging 4.2 x 4.7 x 6.1 cm heterogeneous right neck mass in the right level 2 & level 3 stations. It invades the carotid space, obstructing the internal jugular vein. The primary lesion may be along the inferior aspect of the right palatine tonsil    05/22/2013 Procedure Laryngoscopy revealed abnormalities beneath the right tonsil area. Unfortunately tonsillectomy and right neck biopsy came back benign tissue.   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   06/24/2013 Surgery He underwent multiple extraction of tooth numbers 2, 3, 6, 8, 9, 11, 12, 13, 14, 15, 19, 20, 22, 23, 24, 25, 26, 27, and 28 along with 4 Quadrants of alveoloplasty   06/28/2013 Imaging PET/CT scan results show widespread pulmonary metastasis.   07/11/2013 - 11/07/2013 Chemotherapy The patient received palliative chemotherapy with 5-FU, carboplatin and weekly Erbitux.   09/09/2013 Imaging Repeat PET CT scan show good response to treatment.   11/14/2013 Imaging Repeat PET/CT showed disease progression   11/26/2013 - 12/18/2013 Radiation Therapy He received palliative radiation therapy to the neck and the back.   01/15/2014 Imaging Repeat PET/CT scan showed improvement in  disease control around his neck but significant progression of pulmonary metastases   01/23/2014 -  Chemotherapy He received palliative treatment with Taxol weekly    INTERVAL HISTORY: Please see below for problem oriented charting. He is seen prior to week 3 of Taxol. He denies side effects of treatment. Denies neuropathy. No recent infection.  REVIEW OF SYSTEMS:   Constitutional: Denies fevers, chills or abnormal weight loss Eyes: Denies blurriness of vision Ears, nose, mouth, throat, and face: Denies mucositis or sore throat Respiratory: Denies cough, dyspnea or wheezes Cardiovascular: Denies palpitation, chest discomfort or lower extremity swelling Gastrointestinal:  Denies nausea, heartburn or change in bowel habits Skin: Denies abnormal skin rashes Lymphatics: Denies new lymphadenopathy or easy bruising Neurological:Denies numbness, tingling or new weaknesses Behavioral/Psych: Mood is stable, no new changes  All other systems were reviewed with the patient and are negative.  I have reviewed the past medical history, past surgical history, social history and family history with the patient and they are unchanged from previous note.  ALLERGIES:  has No Known Allergies.  MEDICATIONS:  Current Outpatient Prescriptions  Medication Sig Dispense Refill  . Diphenhyd-Lidocaine-Nystatin (FIRST-BXN MOUTHWASH) SUSP Take 5 mLs by mouth 4 (four) times daily. Verbal order from Dr. Alvy Bimler for these ingredients plus Maalox in a 1:1:1:1 concentration.  Called to [employer] for Quantity Sufficient. 1 Bottle 2  . docusate sodium (COLACE) 100 MG capsule Take 1 capsule (100 mg total) by mouth 2 (two) times daily. 60 capsule 0  . lidocaine-prilocaine (EMLA) cream Apply 1 application topically as needed. 30 g 6  . morphine (MSIR) 30 MG  tablet Take 1 tablet (30 mg total) by mouth every 6 (six) hours as needed for severe pain. 90 tablet 0  . Ondansetron HCl (ZOFRAN PO) Take by mouth as needed.     . Prochlorperazine Maleate (COMPAZINE PO) Take by mouth as needed.     No current facility-administered medications for this visit.   Facility-Administered Medications Ordered in Other Visits  Medication Dose Route Frequency Provider Last Rate Last Dose  . PACLitaxel (TAXOL) 126 mg in dextrose 5 % 250 mL chemo infusion (> 80mg /m2)  80 mg/m2 (Treatment Plan Actual) Intravenous Once Heath Lark, MD 271 mL/hr at 02/06/14 1420 126 mg at 02/06/14 1420  . sodium chloride 0.9 % injection 10 mL  10 mL Intracatheter PRN Heath Lark, MD   10 mL at 02/06/14 1528    PHYSICAL EXAMINATION: ECOG PERFORMANCE STATUS: 0 - Asymptomatic  Filed Vitals:   02/06/14 1145  BP: 149/73  Pulse: 101  Temp: 98.4 F (36.9 C)  Resp: 20   Filed Weights   02/06/14 1145  Weight: 111 lb 3.2 oz (50.44 kg)    GENERAL:alert, no distress and comfortable. He looks thin and cachectic SKIN: skin color, texture, turgor are normal, no rashes or significant lesions EYES: normal, Conjunctiva are pink and non-injected, sclera clear OROPHARYNX:no exudate, no erythema and lips, buccal mucosa, and tongue normal  NECK: supple, thyroid normal size, non-tender, without nodularity LYMPH:  Previous lymphadenopathy on the right side the neck has regressed in size. LUNGS: clear to auscultation and percussion with normal breathing effort HEART: regular rate & rhythm and no murmurs and no lower extremity edema ABDOMEN:abdomen soft, non-tender and normal bowel sounds Musculoskeletal:no cyanosis of digits and no clubbing  NEURO: alert & oriented x 3 with fluent speech, no focal motor/sensory deficits  LABORATORY DATA:  I have reviewed the data as listed    Component Value Date/Time   NA 139 02/06/2014 1132   K 4.0 02/06/2014 1132   CO2 28 02/06/2014 1132   GLUCOSE 105 02/06/2014 1132   BUN 8.5 02/06/2014 1132   CREATININE 0.6* 02/06/2014 1132   CALCIUM 9.2 02/06/2014 1132   PROT 7.8 02/06/2014 1132   ALBUMIN 3.5 02/06/2014  1132   AST 30 02/06/2014 1132   ALT 26 02/06/2014 1132   ALKPHOS 65 02/06/2014 1132   BILITOT 0.33 02/06/2014 1132    No results found for: SPEP, UPEP  Lab Results  Component Value Date   WBC 4.8 02/06/2014   NEUTROABS 3.1 02/06/2014   HGB 10.4* 02/06/2014   HCT 33.7* 02/06/2014   MCV 94.4 02/06/2014   PLT 197 02/06/2014      Chemistry      Component Value Date/Time   NA 139 02/06/2014 1132   K 4.0 02/06/2014 1132   CO2 28 02/06/2014 1132   BUN 8.5 02/06/2014 1132   CREATININE 0.6* 02/06/2014 1132      Component Value Date/Time   CALCIUM 9.2 02/06/2014 1132   ALKPHOS 65 02/06/2014 1132   AST 30 02/06/2014 1132   ALT 26 02/06/2014 1132   BILITOT 0.33 02/06/2014 1132      ASSESSMENT & PLAN:  Oropharyngeal cancer He tolerated Taxol well without side effects. We will proceed with week 3 treatment and he would take 1 week break. I'll see him back in 2 weeks for further assessment   Anemia in neoplastic disease This is likely due to recent treatment. The patient denies recent history of bleeding such as epistaxis, hematuria or hematochezia. He is asymptomatic from  the anemia. I will observe for now.  He does not require transfusion now.   Weight loss He looks cachectic. Continue to encourage oral intake. He will continue close follow-up with dietitian.   Throat pain His pain appeared to be well controlled with current prescription of morphine. I will refill his prescription in 2 weeks.      No orders of the defined types were placed in this encounter.   All questions were answered. The patient knows to call the clinic with any problems, questions or concerns. No barriers to learning was detected. I spent 25 minutes counseling the patient face to face. The total time spent in the appointment was 30 minutes and more than 50% was on counseling and review of test results     [facility], [place], MD 02/06/2014 3:30 PM

## 2014-02-06 NOTE — Assessment & Plan Note (Signed)
This is likely due to recent treatment. The patient denies recent history of bleeding such as epistaxis, hematuria or hematochezia. He is asymptomatic from the anemia. I will observe for now.  He does not require transfusion now. 

## 2014-02-06 NOTE — Telephone Encounter (Signed)
Per staff message and POF I have scheduled appts. Advised scheduler of appts. JMW  

## 2014-02-06 NOTE — Telephone Encounter (Signed)
Gave asv & calendar for February. Sent message to schedule treatment.

## 2014-02-06 NOTE — Assessment & Plan Note (Signed)
He looks cachectic. Continue to encourage oral intake. He will continue close follow-up with dietitian.

## 2014-02-12 ENCOUNTER — Telehealth: Payer: Self-pay | Admitting: Nurse Practitioner

## 2014-02-12 ENCOUNTER — Telehealth: Payer: Self-pay | Admitting: Hematology and Oncology

## 2014-02-12 NOTE — Telephone Encounter (Signed)
s.w. pt and advised on cx appt and upcomming pt....ok and aware

## 2014-02-12 NOTE — Telephone Encounter (Signed)
Per pharmacy, patient is "off" from treatment tomorrow and should not come in tomorrow 02/13/14. He is already aware.

## 2014-02-13 ENCOUNTER — Ambulatory Visit: Payer: Self-pay

## 2014-02-20 ENCOUNTER — Telehealth: Payer: Self-pay | Admitting: Hematology and Oncology

## 2014-02-20 ENCOUNTER — Ambulatory Visit (HOSPITAL_BASED_OUTPATIENT_CLINIC_OR_DEPARTMENT_OTHER): Payer: BLUE CROSS/BLUE SHIELD | Admitting: Hematology and Oncology

## 2014-02-20 ENCOUNTER — Other Ambulatory Visit (HOSPITAL_BASED_OUTPATIENT_CLINIC_OR_DEPARTMENT_OTHER): Payer: BLUE CROSS/BLUE SHIELD

## 2014-02-20 ENCOUNTER — Other Ambulatory Visit: Payer: Self-pay | Admitting: Hematology and Oncology

## 2014-02-20 ENCOUNTER — Telehealth: Payer: Self-pay | Admitting: *Deleted

## 2014-02-20 ENCOUNTER — Encounter: Payer: Self-pay | Admitting: Hematology and Oncology

## 2014-02-20 ENCOUNTER — Ambulatory Visit (HOSPITAL_BASED_OUTPATIENT_CLINIC_OR_DEPARTMENT_OTHER): Payer: BLUE CROSS/BLUE SHIELD

## 2014-02-20 VITALS — BP 142/67 | HR 88 | Temp 98.6°F | Resp 18 | Ht 73.0 in | Wt 115.0 lb

## 2014-02-20 DIAGNOSIS — Z5111 Encounter for antineoplastic chemotherapy: Secondary | ICD-10-CM

## 2014-02-20 DIAGNOSIS — D630 Anemia in neoplastic disease: Secondary | ICD-10-CM

## 2014-02-20 DIAGNOSIS — R070 Pain in throat: Secondary | ICD-10-CM

## 2014-02-20 DIAGNOSIS — C109 Malignant neoplasm of oropharynx, unspecified: Secondary | ICD-10-CM

## 2014-02-20 LAB — CBC WITH DIFFERENTIAL/PLATELET
BASO%: 1.1 % (ref 0.0–2.0)
BASOS ABS: 0.1 10*3/uL (ref 0.0–0.1)
EOS%: 0.9 % (ref 0.0–7.0)
Eosinophils Absolute: 0.1 10*3/uL (ref 0.0–0.5)
HCT: 33.1 % — ABNORMAL LOW (ref 38.4–49.9)
HGB: 10.3 g/dL — ABNORMAL LOW (ref 13.0–17.1)
LYMPH%: 11.5 % — AB (ref 14.0–49.0)
MCH: 28.9 pg (ref 27.2–33.4)
MCHC: 31 g/dL — ABNORMAL LOW (ref 32.0–36.0)
MCV: 93 fL (ref 79.3–98.0)
MONO#: 1.1 10*3/uL — ABNORMAL HIGH (ref 0.1–0.9)
MONO%: 17.1 % — ABNORMAL HIGH (ref 0.0–14.0)
NEUT%: 69.4 % (ref 39.0–75.0)
NEUTROS ABS: 4.5 10*3/uL (ref 1.5–6.5)
Platelets: 280 10*3/uL (ref 140–400)
RBC: 3.56 10*6/uL — ABNORMAL LOW (ref 4.20–5.82)
RDW: 24.9 % — AB (ref 11.0–14.6)
WBC: 6.5 10*3/uL (ref 4.0–10.3)
lymph#: 0.7 10*3/uL — ABNORMAL LOW (ref 0.9–3.3)

## 2014-02-20 LAB — COMPREHENSIVE METABOLIC PANEL (CC13)
ALBUMIN: 3.3 g/dL — AB (ref 3.5–5.0)
ALT: 18 U/L (ref 0–55)
AST: 19 U/L (ref 5–34)
Alkaline Phosphatase: 66 U/L (ref 40–150)
Anion Gap: 9 mEq/L (ref 3–11)
BUN: 8.6 mg/dL (ref 7.0–26.0)
CO2: 27 mEq/L (ref 22–29)
Calcium: 9.3 mg/dL (ref 8.4–10.4)
Chloride: 102 mEq/L (ref 98–109)
Creatinine: 0.6 mg/dL — ABNORMAL LOW (ref 0.7–1.3)
Glucose: 108 mg/dl (ref 70–140)
Potassium: 4.3 mEq/L (ref 3.5–5.1)
Sodium: 138 mEq/L (ref 136–145)
Total Protein: 7.3 g/dL (ref 6.4–8.3)

## 2014-02-20 MED ORDER — MORPHINE SULFATE 30 MG PO TABS: 30.0000 mg | ORAL_TABLET | Freq: Four times a day (QID) | ORAL | Status: DC | PRN

## 2014-02-20 MED ORDER — DIPHENHYDRAMINE HCL 50 MG/ML IJ SOLN
INTRAMUSCULAR | Status: AC
  Filled 2014-02-20: qty 1

## 2014-02-20 MED ORDER — DEXAMETHASONE SODIUM PHOSPHATE 20 MG/5ML IJ SOLN
INTRAMUSCULAR | Status: AC
  Filled 2014-02-20: qty 5

## 2014-02-20 MED ORDER — SODIUM CHLORIDE 0.9 % IV SOLN
Freq: Once | INTRAVENOUS | Status: AC
  Administered 2014-02-20: 11:00:00 via INTRAVENOUS

## 2014-02-20 MED ORDER — FAMOTIDINE IN NACL 20-0.9 MG/50ML-% IV SOLN
INTRAVENOUS | Status: AC
  Filled 2014-02-20: qty 50

## 2014-02-20 MED ORDER — SODIUM CHLORIDE 0.9 % IJ SOLN
10.0000 mL | INTRAMUSCULAR | Status: DC | PRN
  Administered 2014-02-20: 10 mL
  Filled 2014-02-20: qty 10

## 2014-02-20 MED ORDER — FAMOTIDINE IN NACL 20-0.9 MG/50ML-% IV SOLN
20.0000 mg | Freq: Once | INTRAVENOUS | Status: AC
  Administered 2014-02-20: 20 mg via INTRAVENOUS

## 2014-02-20 MED ORDER — DIPHENHYDRAMINE HCL 50 MG/ML IJ SOLN
50.0000 mg | Freq: Once | INTRAMUSCULAR | Status: AC
  Administered 2014-02-20: 50 mg via INTRAVENOUS

## 2014-02-20 MED ORDER — DEXAMETHASONE SODIUM PHOSPHATE 20 MG/5ML IJ SOLN
20.0000 mg | Freq: Once | INTRAMUSCULAR | Status: AC
  Administered 2014-02-20: 20 mg via INTRAVENOUS

## 2014-02-20 MED ORDER — PACLITAXEL CHEMO INJECTION 300 MG/50ML
80.0000 mg/m2 | Freq: Once | INTRAVENOUS | Status: AC
  Administered 2014-02-20: 126 mg via INTRAVENOUS
  Filled 2014-02-20: qty 21

## 2014-02-20 MED ORDER — HEPARIN SOD (PORK) LOCK FLUSH 100 UNIT/ML IV SOLN
500.0000 [IU] | Freq: Once | INTRAVENOUS | Status: AC | PRN
  Administered 2014-02-20: 500 [IU]
  Filled 2014-02-20: qty 5

## 2014-02-20 NOTE — Assessment & Plan Note (Signed)
His pain appeared to be well controlled with current prescription of morphine.  I have refilled his prescription today. We discussed narcotic refill policy.

## 2014-02-20 NOTE — Progress Notes (Signed)
[facility] OFFICE PROGRESS NOTE  Patient Care Team: No Pcp Per Patient as PCP - General (General Practice) Heath Lark, MD as Consulting Physician (Hematology and Oncology) Ascencion Dike, MD as Consulting Physician (Otolaryngology) Brooks Sailors, RN as Oncology Nurse Navigator (Oncology) Eppie Gibson, MD as Attending Physician (Radiation Oncology) Karie Mainland, RD as Dietitian (Nutrition)  SUMMARY OF ONCOLOGIC HISTORY: Oncology History   Oropharyngeal cancer, HPV negative   Primary site: Pharynx - Oropharynx   Staging method: AJCC 7th Edition   Clinical: Stage IVC (T4b, N3, M1) signed by Heath Lark, MD on 07/01/2013  8:32 PM   Summary: Stage IVC (T4b, N3, M1)         Oropharyngeal cancer   05/06/2013 Imaging 4.2 x 4.7 x 6.1 cm heterogeneous right neck mass in the right level 2 & level 3 stations. It invades the carotid space, obstructing the internal jugular vein. The primary lesion may be along the inferior aspect of the right palatine tonsil    05/22/2013 Procedure Laryngoscopy revealed abnormalities beneath the right tonsil area. Unfortunately tonsillectomy and right neck biopsy came back benign tissue.   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   06/24/2013 Surgery He underwent multiple extraction of tooth numbers 2, 3, 6, 8, 9, 11, 12, 13, 14, 15, 19, 20, 22, 23, 24, 25, 26, 27, and 28 along with 4 Quadrants of alveoloplasty   06/28/2013 Imaging PET/CT scan results show widespread pulmonary metastasis.   07/11/2013 - 11/07/2013 Chemotherapy The patient received palliative chemotherapy with 5-FU, carboplatin and weekly Erbitux.   09/09/2013 Imaging Repeat PET CT scan show good response to treatment.   11/14/2013 Imaging Repeat PET/CT showed disease progression   11/26/2013 - 12/18/2013 Radiation Therapy He received palliative radiation therapy to the neck and the back.   01/15/2014 Imaging Repeat PET/CT scan showed improvement in  disease control around his neck but significant progression of pulmonary metastases   01/23/2014 -  Chemotherapy He received palliative treatment with Taxol weekly    INTERVAL HISTORY: Please see below for problem oriented charting.  he is seen today prior to cycle 2 of treatment. He tolerated treatment well. Denies peripheral neuropathy. His pain in the neck is well controlled with morphine sulfate.  REVIEW OF SYSTEMS:   Constitutional: Denies fevers, chills or abnormal weight loss Eyes: Denies blurriness of vision Ears, nose, mouth, throat, and face: Denies mucositis or sore throat Respiratory: Denies cough, dyspnea or wheezes Cardiovascular: Denies palpitation, chest discomfort or lower extremity swelling Gastrointestinal:  Denies nausea, heartburn or change in bowel habits Skin: Denies abnormal skin rashes Lymphatics: Denies new lymphadenopathy or easy bruising Neurological:Denies numbness, tingling or new weaknesses Behavioral/Psych: Mood is stable, no new changes  All other systems were reviewed with the patient and are negative.  I have reviewed the past medical history, past surgical history, social history and family history with the patient and they are unchanged from previous note.  ALLERGIES:  has No Known Allergies.  MEDICATIONS:  Current Outpatient Prescriptions  Medication Sig Dispense Refill  . Diphenhyd-Lidocaine-Nystatin (FIRST-BXN MOUTHWASH) SUSP Take 5 mLs by mouth 4 (four) times daily. Verbal order from Dr. Alvy Bimler for these ingredients plus Maalox in a 1:1:1:1 concentration.  Called to [employer] for Quantity Sufficient. 1 Bottle 2  . docusate sodium (COLACE) 100 MG capsule Take 1 capsule (100 mg total) by mouth 2 (two) times daily. 60 capsule 0  . lidocaine-prilocaine (EMLA) cream Apply 1 application topically as needed.  30 g 6  . morphine (MSIR) 30 MG tablet Take 1 tablet (30 mg total) by mouth every 6 (six) hours as needed for severe pain. 90 tablet  0  . Ondansetron HCl (ZOFRAN PO) Take by mouth as needed.    . Prochlorperazine Maleate (COMPAZINE PO) Take by mouth as needed.    . lidocaine-prilocaine (EMLA) cream Apply 1 application topically as needed. 30 g 6   No current facility-administered medications for this visit.   Facility-Administered Medications Ordered in Other Visits  Medication Dose Route Frequency Provider Last Rate Last Dose  . sodium chloride 0.9 % injection 10 mL  10 mL Intracatheter PRN Heath Lark, MD   10 mL at 02/20/14 1338    PHYSICAL EXAMINATION: ECOG PERFORMANCE STATUS: 0 - Asymptomatic  Filed Vitals:   02/20/14 1023  BP: 142/67  Pulse: 88  Temp: 98.6 F (37 C)  Resp: 18   Filed Weights   02/20/14 1023  Weight: 115 lb (52.164 kg)    GENERAL:alert, no distress and comfortable. He looks thin and cachectic SKIN: skin color, texture, turgor are normal, no rashes or significant lesions EYES: normal, Conjunctiva are pink and non-injected, sclera clear OROPHARYNX:no exudate, no erythema and lips, buccal mucosa, and tongue normal  NECK:  He has persistent lymphadenopathy in the right side of the neck, unchanged. LUNGS: clear to auscultation and percussion with normal breathing effort HEART: regular rate & rhythm and no murmurs and no lower extremity edema ABDOMEN:abdomen soft, non-tender and normal bowel sounds Musculoskeletal:no cyanosis of digits and no clubbing  NEURO: alert & oriented x 3 with fluent speech, no focal motor/sensory deficits  LABORATORY DATA:  I have reviewed the data as listed    Component Value Date/Time   NA 138 02/20/2014 1006   K 4.3 02/20/2014 1006   CO2 27 02/20/2014 1006   GLUCOSE 108 02/20/2014 1006   BUN 8.6 02/20/2014 1006   CREATININE 0.6* 02/20/2014 1006   CALCIUM 9.3 02/20/2014 1006   PROT 7.3 02/20/2014 1006   ALBUMIN 3.3* 02/20/2014 1006   AST 19 02/20/2014 1006   ALT 18 02/20/2014 1006   ALKPHOS 66 02/20/2014 1006   BILITOT <0.20 02/20/2014 1006    No  results found for: SPEP, UPEP  Lab Results  Component Value Date   WBC 6.5 02/20/2014   NEUTROABS 4.5 02/20/2014   HGB 10.3* 02/20/2014   HCT 33.1* 02/20/2014   MCV 93.0 02/20/2014   PLT 280 02/20/2014      Chemistry      Component Value Date/Time   NA 138 02/20/2014 1006   K 4.3 02/20/2014 1006   CO2 27 02/20/2014 1006   BUN 8.6 02/20/2014 1006   CREATININE 0.6* 02/20/2014 1006      Component Value Date/Time   CALCIUM 9.3 02/20/2014 1006   ALKPHOS 66 02/20/2014 1006   AST 19 02/20/2014 1006   ALT 18 02/20/2014 1006   BILITOT <0.20 02/20/2014 1006       RADIOGRAPHIC STUDIES: I have personally reviewed the radiological images as listed and agreed with the findings in the report. No results found.   ASSESSMENT & PLAN:  Oropharyngeal cancer He tolerated Taxol well without side effects. We will proceed with week 3 treatment and he would take 1 week break. I'll see him back in 4 weeks for further assessment.  I plan to restage his disease after 3 cycles of treatment.     Anemia in neoplastic disease This is likely due to recent treatment. The patient  denies recent history of bleeding such as epistaxis, hematuria or hematochezia. He is asymptomatic from the anemia. I will observe for now.  He does not require transfusion now.   Throat pain His pain appeared to be well controlled with current prescription of morphine.  I have refilled his prescription today. We discussed narcotic refill policy.      No orders of the defined types were placed in this encounter.   All questions were answered. The patient knows to call the clinic with any problems, questions or concerns. No barriers to learning was detected. I spent 25 minutes counseling the patient face to face. The total time spent in the appointment was 30 minutes and more than 50% was on counseling and review of test results     [facility], [place], MD 02/20/2014 4:20 PM

## 2014-02-20 NOTE — Telephone Encounter (Signed)
Per staff message and POF I have scheduled appts. Advised scheduler of appts. JMW  

## 2014-02-20 NOTE — Patient Instructions (Signed)
[facility] Discharge Instructions for Patients Receiving Chemotherapy  Today you received the following chemotherapy agents: Taxol.  To help prevent nausea and vomiting after your treatment, we encourage you to take your nausea medication as prescribed.   If you develop nausea and vomiting that is not controlled by your nausea medication, call the clinic.   BELOW ARE SYMPTOMS THAT SHOULD BE REPORTED IMMEDIATELY:  *FEVER GREATER THAN 100.5 F  *CHILLS WITH OR WITHOUT FEVER  NAUSEA AND VOMITING THAT IS NOT CONTROLLED WITH YOUR NAUSEA MEDICATION  *UNUSUAL SHORTNESS OF BREATH  *UNUSUAL BRUISING OR BLEEDING  TENDERNESS IN MOUTH AND THROAT WITH OR WITHOUT PRESENCE OF ULCERS  *URINARY PROBLEMS  *BOWEL PROBLEMS  UNUSUAL RASH Items with * indicate a potential emergency and should be followed up as soon as possible.  Feel free to call the clinic you have any questions or concerns. The clinic phone number is [phone].    

## 2014-02-20 NOTE — Assessment & Plan Note (Signed)
This is likely due to recent treatment. The patient denies recent history of bleeding such as epistaxis, hematuria or hematochezia. He is asymptomatic from the anemia. I will observe for now.  He does not require transfusion now. 

## 2014-02-20 NOTE — Assessment & Plan Note (Signed)
He tolerated Taxol well without side effects. We will proceed with week 3 treatment and he would take 1 week break. I'll see him back in 4 weeks for further assessment.  I plan to restage his disease after 3 cycles of treatment.

## 2014-02-21 ENCOUNTER — Ambulatory Visit: Payer: BLUE CROSS/BLUE SHIELD

## 2014-02-25 ENCOUNTER — Ambulatory Visit: Payer: BLUE CROSS/BLUE SHIELD | Attending: Radiation Oncology

## 2014-02-25 DIAGNOSIS — R1310 Dysphagia, unspecified: Secondary | ICD-10-CM | POA: Diagnosis not present

## 2014-02-25 NOTE — Therapy (Signed)
[facility] [address] [place], [place], [address] Phone: [phone]   Fax:  [phone]  Speech Language Pathology Treatment  Patient Details  Name: Derek Morgan MRN: [id number] Date of Birth: 1955/08/06 Referring Provider:  Eppie Gibson, MD  Encounter Date: 02/25/2014      End of Session - 02/25/14 0939    Visit Number 5   Number of Visits 6   Date for SLP Re-Evaluation 05/25/14   SLP Start Time 0851   SLP Stop Time  0924   SLP Time Calculation (min) 33 min   Activity Tolerance Patient tolerated treatment well      Past Medical History  Diagnosis Date  . Heavy smoker   . Hoarseness of voice   . Mass in neck 04/2013    right/ Metastatic Squamous Cell Carcinoma  . Loose, teeth 06/24/2013  . Weight loss 06/17/2013  . Throat pain 06/17/2013  . Shortness of breath     with exertion at times  . Cancer     oral, pharyngeal  . History of radiation therapy 11/26/13- 12/18/13    right neck mass/C5 lesion 37.5 Gy 15 fractions    Past Surgical History  Procedure Laterality Date  . Broken leg  1998    orif ankle-left-plated  . Direct laryngoscopy N/A 05/22/2013    Procedure: DIRECT LARYNGOSCOPY;  Surgeon: Ascencion Dike, MD;  Location: [facility] OR;  Service: ENT;  Laterality: N/A;  . Tonsillectomy Right 05/22/2013    Procedure: RIGHT TONSILLECTOMY;  Surgeon: Ascencion Dike, MD;  Location: [facility];  Service: ENT;  Laterality: Right;  . Mass biopsy Right 05/22/2013    Procedure:  INCISIONAL BIOPSY NECK MASS;  Surgeon: Ascencion Dike, MD;  Location: [facility];  Service: ENT;  Laterality: Right;  . Mass biopsy Right 06/07/2013    Procedure: EXCISIONAL BIOPSY OF RIGHT NECK MASS;  Surgeon: Ascencion Dike, MD;  Location: [facility];  Service: ENT;  Laterality: Right;  . Multiple extractions with alveoloplasty N/A 06/24/2013    Procedure: Extraction of tooth #'s 2,3,6,8,9,11,12,13,14,15,19,20,22,23,24,25,26,27,28 with alveoloplasty.;  Surgeon: Lenn Cal,  DDS;  Location: [facility];  Service: Oral Surgery;  Laterality: N/A;  . Portacath placement Left 07/04/2013    Procedure: INSERTION PORT-A-CATH;  Surgeon: Merrie Roof, MD;  Location: [facility];  Service: General;  Laterality: Left;    There were no vitals taken for this visit.  Visit Diagnosis: Dysphagia      Subjective Assessment - 02/25/14 0855    Symptoms Pt max hoarse. I day a week for three weeks then one week off for chemo. Pt now completing exercises "a little more" than 2-3 times a week.             ADULT SLP TREATMENT - 02/25/14 0858    General Information   Behavior/Cognition Alert;Cooperative;Pleasant mood   Treatment Provided   Treatment provided Dysphagia   Dysphagia Treatment   Oral Cavity - Dentition Edentulous   Patient observed directly with PO's Yes   Type of PO's observed Dysphagia 3 (soft)   Feeding Able to feed self   Liquids provided via Cup   Oral Phase Signs & Symptoms --  None   Pharyngeal Phase Signs & Symptoms --  none   Other treatment/comments Success with HEP much like previous visit last month; mod A needed usually, and pt was independent with each exercise by end of session. Pt was observed with DysIII/thin without overt s/s of aspiration during this  time. Pt was again reminded of threat of muscle fibrosis even though his swallowing skills appear WNL/WFL at this time. SLP told pt to complete HEP x2-3/day until 07-21-14, then x2-3/week after that time. Pt asked for and SLP re-copied 3 copies of pt's HEP.   Pain Assessment   Pain Assessment 0-10   Pain Score 4    Pain Location rt neck   Pain Descriptors / Indicators Sore   Pain Intervention(s) Monitored during session   Assessment / Recommendations / Plan   Plan Continue with current plan of care   Dysphagia Recommendations   Diet recommendations Dysphagia 3 (mechanical soft);Thin liquid   Progression Toward Goals   Progression toward goals Progressing toward goals          SLP Education -  02/25/14 [id number]    Education provided Yes   Education Details HEP, effects of not completing HEP as prescribed   Person(s) Educated Patient   Methods Explanation;Demonstration   Comprehension Verbalized understanding;Returned demonstration          SLP Short Term Goals - 02/25/14 0941    SLP SHORT TERM GOAL #1   Title pt will complete HEP with rare min A (03-25-14)   Time 1   Period --  visit   Status On-going  Extended    SLP SHORT TERM GOAL #2   Title pt will tell SLP why he is completing HEP    Time --   Period --   Status Achieved  08-26-13          SLP Long Term Goals - 02/25/14 4259    SLP LONG TERM GOAL #1   Title copmlete HEP with modified independence (05-25-14)   Time 3   Period --  visits   Status On-going  extended   SLP LONG TERM GOAL #2   Title tell SLP three s/s aspiration PNA (05-25-14)   Time 3   Period --  visits   Status On-going  extended   SLP LONG TERM GOAL #3   Title tell SLP why a food journal could benefit pt during return to PO diet after head/neck rad (05-25-14)   Time 3   Period --  visits   Status On-going  extended          Plan - 02/25/14 0941    Clinical Impression Statement Pt again presents today with frequency of HEP completion of less than prescribed. Pt was independent with HEP by end of session, but this took mod A usually. Pt without overt s/s aspiration PNA today.   Speech Therapy Frequency Monthly   Duration --  3 more visits   Treatment/Interventions Aspiration precaution training;Pharyngeal strengthening exercises;Oral motor exercises;SLP instruction and feedback;Diet toleration management by SLP   Potential to Achieve Goals Good   Potential Considerations Cooperation/participation level        Problem List Patient Active Problem List   Diagnosis Date Noted  . DNR (do not resuscitate) discussion 12/23/2013  . Weakness generalized 12/23/2013  . Mucositis due to antineoplastic therapy 12/05/2013  . 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 Leukopenia due to antineoplastic chemotherapy 08/22/2013  . Thrombocytopenia due to drugs 08/22/2013  . Skin rash 08/01/2013  . Dental caries 06/24/2013  . Weight loss 06/17/2013  . 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 Tobacco abuse 06/17/2013  . Throat pain 06/17/2013    [facility], SLP 02/25/2014, 9:44 AM  [facility] [address] [place], [place], [address] Phone: [phone]   Fax:  [phone]

## 2014-02-25 NOTE — Patient Instructions (Signed)
  You must complete the home exercises as directed or risk the threat of muscle hardening. Do them 2-3 times per DAY until 07-21-14, THEN do them 2-3 times a week after that time.

## 2014-02-27 ENCOUNTER — Ambulatory Visit (HOSPITAL_BASED_OUTPATIENT_CLINIC_OR_DEPARTMENT_OTHER): Payer: BLUE CROSS/BLUE SHIELD

## 2014-02-27 ENCOUNTER — Other Ambulatory Visit (HOSPITAL_BASED_OUTPATIENT_CLINIC_OR_DEPARTMENT_OTHER): Payer: BLUE CROSS/BLUE SHIELD

## 2014-02-27 DIAGNOSIS — Z5111 Encounter for antineoplastic chemotherapy: Secondary | ICD-10-CM

## 2014-02-27 DIAGNOSIS — C109 Malignant neoplasm of oropharynx, unspecified: Secondary | ICD-10-CM

## 2014-02-27 LAB — COMPREHENSIVE METABOLIC PANEL (CC13)
ALT: 24 U/L (ref 0–55)
ANION GAP: 9 meq/L (ref 3–11)
AST: 27 U/L (ref 5–34)
Albumin: 3.7 g/dL (ref 3.5–5.0)
Alkaline Phosphatase: 67 U/L (ref 40–150)
BILIRUBIN TOTAL: 0.22 mg/dL (ref 0.20–1.20)
BUN: 10.9 mg/dL (ref 7.0–26.0)
CO2: 28 meq/L (ref 22–29)
CREATININE: 0.7 mg/dL (ref 0.7–1.3)
Calcium: 9.4 mg/dL (ref 8.4–10.4)
Chloride: 101 mEq/L (ref 98–109)
EGFR: >90 mL/min/{1.73_m2} (ref >90)
Glucose: 118 mg/dl (ref 70–140)
Potassium: 4.2 mEq/L (ref 3.5–5.1)
Sodium: 139 mEq/L (ref 136–145)
Total Protein: 7.7 g/dL (ref 6.4–8.3)

## 2014-02-27 LAB — CBC WITH DIFFERENTIAL/PLATELET
BASO%: 0.6 % (ref 0.0–2.0)
Basophils Absolute: 0 10*3/uL (ref 0.0–0.1)
EOS ABS: 0.1 10*3/uL (ref 0.0–0.5)
EOS%: 1.5 % (ref 0.0–7.0)
HCT: 36 % — ABNORMAL LOW (ref 38.4–49.9)
HEMOGLOBIN: 11.2 g/dL — AB (ref 13.0–17.1)
LYMPH%: 16 % (ref 14.0–49.0)
MCH: 29.2 pg (ref 27.2–33.4)
MCHC: 31.1 g/dL — ABNORMAL LOW (ref 32.0–36.0)
MCV: 93.8 fL (ref 79.3–98.0)
MONO#: 0.7 10*3/uL (ref 0.1–0.9)
MONO%: 10 % (ref 0.0–14.0)
NEUT#: 4.7 10*3/uL (ref 1.5–6.5)
NEUT%: 71.9 % (ref 39.0–75.0)
PLATELETS: 255 10*3/uL (ref 140–400)
RBC: 3.84 10*6/uL — AB (ref 4.20–5.82)
RDW: 20.7 % — ABNORMAL HIGH (ref 11.0–14.6)
WBC: 6.5 10*3/uL (ref 4.0–10.3)
lymph#: 1 10*3/uL (ref 0.9–3.3)

## 2014-02-27 MED ORDER — SODIUM CHLORIDE 0.9 % IJ SOLN
10.0000 mL | INTRAMUSCULAR | Status: DC | PRN
  Administered 2014-02-27: 10 mL
  Filled 2014-02-27: qty 10

## 2014-02-27 MED ORDER — FAMOTIDINE IN NACL 20-0.9 MG/50ML-% IV SOLN
INTRAVENOUS | Status: AC
  Filled 2014-02-27: qty 50

## 2014-02-27 MED ORDER — HEPARIN SOD (PORK) LOCK FLUSH 100 UNIT/ML IV SOLN
500.0000 [IU] | Freq: Once | INTRAVENOUS | Status: AC | PRN
  Administered 2014-02-27: 500 [IU]
  Filled 2014-02-27: qty 5

## 2014-02-27 MED ORDER — DIPHENHYDRAMINE HCL 50 MG/ML IJ SOLN
INTRAMUSCULAR | Status: AC
  Filled 2014-02-27: qty 1

## 2014-02-27 MED ORDER — DIPHENHYDRAMINE HCL 50 MG/ML IJ SOLN
50.0000 mg | Freq: Once | INTRAMUSCULAR | Status: AC
  Administered 2014-02-27: 50 mg via INTRAVENOUS

## 2014-02-27 MED ORDER — PACLITAXEL CHEMO INJECTION 300 MG/50ML
80.0000 mg/m2 | Freq: Once | INTRAVENOUS | Status: AC
  Administered 2014-02-27: 126 mg via INTRAVENOUS
  Filled 2014-02-27: qty 21

## 2014-02-27 MED ORDER — FAMOTIDINE IN NACL 20-0.9 MG/50ML-% IV SOLN
20.0000 mg | Freq: Once | INTRAVENOUS | Status: AC
  Administered 2014-02-27: 20 mg via INTRAVENOUS

## 2014-02-27 MED ORDER — DEXAMETHASONE SODIUM PHOSPHATE 20 MG/5ML IJ SOLN
INTRAMUSCULAR | Status: AC
  Filled 2014-02-27: qty 5

## 2014-02-27 MED ORDER — DEXAMETHASONE SODIUM PHOSPHATE 20 MG/5ML IJ SOLN
20.0000 mg | Freq: Once | INTRAMUSCULAR | Status: AC
  Administered 2014-02-27: 20 mg via INTRAVENOUS

## 2014-02-27 MED ORDER — SODIUM CHLORIDE 0.9 % IV SOLN
Freq: Once | INTRAVENOUS | Status: AC
  Administered 2014-02-27: 09:00:00 via INTRAVENOUS

## 2014-02-27 NOTE — Patient Instructions (Signed)
[facility] Discharge Instructions for Patients Receiving Chemotherapy  Today you received the following chemotherapy agents:  Taxol  To help prevent nausea and vomiting after your treatment, we encourage you to take your nausea medication as ordered per MD.   If you develop nausea and vomiting that is not controlled by your nausea medication, call the clinic.   BELOW ARE SYMPTOMS THAT SHOULD BE REPORTED IMMEDIATELY:  *FEVER GREATER THAN 100.5 F  *CHILLS WITH OR WITHOUT FEVER  NAUSEA AND VOMITING THAT IS NOT CONTROLLED WITH YOUR NAUSEA MEDICATION  *UNUSUAL SHORTNESS OF BREATH  *UNUSUAL BRUISING OR BLEEDING  TENDERNESS IN MOUTH AND THROAT WITH OR WITHOUT PRESENCE OF ULCERS  *URINARY PROBLEMS  *BOWEL PROBLEMS  UNUSUAL RASH Items with * indicate a potential emergency and should be followed up as soon as possible.  Feel free to call the clinic you have any questions or concerns. The clinic phone number is [phone].    

## 2014-03-06 ENCOUNTER — Ambulatory Visit (HOSPITAL_BASED_OUTPATIENT_CLINIC_OR_DEPARTMENT_OTHER): Payer: BLUE CROSS/BLUE SHIELD

## 2014-03-06 ENCOUNTER — Other Ambulatory Visit (HOSPITAL_BASED_OUTPATIENT_CLINIC_OR_DEPARTMENT_OTHER): Payer: BLUE CROSS/BLUE SHIELD

## 2014-03-06 DIAGNOSIS — C109 Malignant neoplasm of oropharynx, unspecified: Secondary | ICD-10-CM

## 2014-03-06 DIAGNOSIS — Z5111 Encounter for antineoplastic chemotherapy: Secondary | ICD-10-CM

## 2014-03-06 LAB — CBC WITH DIFFERENTIAL/PLATELET
BASO%: 1.2 % (ref 0.0–2.0)
Basophils Absolute: 0.1 10*3/uL (ref 0.0–0.1)
EOS%: 1.8 % (ref 0.0–7.0)
Eosinophils Absolute: 0.1 10*3/uL (ref 0.0–0.5)
HEMATOCRIT: 34.7 % — AB (ref 38.4–49.9)
HGB: 10.9 g/dL — ABNORMAL LOW (ref 13.0–17.1)
LYMPH%: 12.8 % — AB (ref 14.0–49.0)
MCH: 29.3 pg (ref 27.2–33.4)
MCHC: 31.5 g/dL — ABNORMAL LOW (ref 32.0–36.0)
MCV: 92.9 fL (ref 79.3–98.0)
MONO#: 0.9 10*3/uL (ref 0.1–0.9)
MONO%: 12.5 % (ref 0.0–14.0)
NEUT#: 5.1 10*3/uL (ref 1.5–6.5)
NEUT%: 71.7 % (ref 39.0–75.0)
PLATELETS: 254 10*3/uL (ref 140–400)
RBC: 3.74 10*6/uL — AB (ref 4.20–5.82)
RDW: 24.7 % — ABNORMAL HIGH (ref 11.0–14.6)
WBC: 7.1 10*3/uL (ref 4.0–10.3)
lymph#: 0.9 10*3/uL (ref 0.9–3.3)

## 2014-03-06 LAB — COMPREHENSIVE METABOLIC PANEL (CC13)
ALK PHOS: 62 U/L (ref 40–150)
ALT: 25 U/L (ref 0–55)
AST: 26 U/L (ref 5–34)
Albumin: 3.6 g/dL (ref 3.5–5.0)
Anion Gap: 10 mEq/L (ref 3–11)
BUN: 11.6 mg/dL (ref 7.0–26.0)
CO2: 27 mEq/L (ref 22–29)
CREATININE: 0.7 mg/dL (ref 0.7–1.3)
Calcium: 9.4 mg/dL (ref 8.4–10.4)
Chloride: 102 mEq/L (ref 98–109)
Glucose: 116 mg/dl (ref 70–140)
Potassium: 4.3 mEq/L (ref 3.5–5.1)
Sodium: 138 mEq/L (ref 136–145)
Total Bilirubin: 0.21 mg/dL (ref 0.20–1.20)
Total Protein: 7.3 g/dL (ref 6.4–8.3)

## 2014-03-06 MED ORDER — HEPARIN SOD (PORK) LOCK FLUSH 100 UNIT/ML IV SOLN
500.0000 [IU] | Freq: Once | INTRAVENOUS | Status: AC | PRN
  Administered 2014-03-06: 500 [IU]
  Filled 2014-03-06: qty 5

## 2014-03-06 MED ORDER — SODIUM CHLORIDE 0.9 % IV SOLN
Freq: Once | INTRAVENOUS | Status: AC
  Administered 2014-03-06: 09:00:00 via INTRAVENOUS

## 2014-03-06 MED ORDER — FAMOTIDINE IN NACL 20-0.9 MG/50ML-% IV SOLN
INTRAVENOUS | Status: AC
  Filled 2014-03-06: qty 50

## 2014-03-06 MED ORDER — DEXAMETHASONE SODIUM PHOSPHATE 20 MG/5ML IJ SOLN
INTRAMUSCULAR | Status: AC
Start: 2014-03-06 — End: 2014-03-06
  Filled 2014-03-06: qty 5

## 2014-03-06 MED ORDER — FAMOTIDINE IN NACL 20-0.9 MG/50ML-% IV SOLN
20.0000 mg | Freq: Once | INTRAVENOUS | Status: AC
  Administered 2014-03-06: 20 mg via INTRAVENOUS

## 2014-03-06 MED ORDER — DIPHENHYDRAMINE HCL 50 MG/ML IJ SOLN
50.0000 mg | Freq: Once | INTRAMUSCULAR | Status: AC
  Administered 2014-03-06: 50 mg via INTRAVENOUS

## 2014-03-06 MED ORDER — DEXAMETHASONE SODIUM PHOSPHATE 20 MG/5ML IJ SOLN
20.0000 mg | Freq: Once | INTRAMUSCULAR | Status: AC
  Administered 2014-03-06: 20 mg via INTRAVENOUS

## 2014-03-06 MED ORDER — PACLITAXEL CHEMO INJECTION 300 MG/50ML
80.0000 mg/m2 | Freq: Once | INTRAVENOUS | Status: AC
  Administered 2014-03-06: 126 mg via INTRAVENOUS
  Filled 2014-03-06: qty 21

## 2014-03-06 MED ORDER — DIPHENHYDRAMINE HCL 50 MG/ML IJ SOLN
INTRAMUSCULAR | Status: AC
  Filled 2014-03-06: qty 1

## 2014-03-06 MED ORDER — SODIUM CHLORIDE 0.9 % IJ SOLN
10.0000 mL | INTRAMUSCULAR | Status: DC | PRN
  Administered 2014-03-06: 10 mL
  Filled 2014-03-06: qty 10

## 2014-03-06 NOTE — Patient Instructions (Signed)
[facility] Discharge Instructions for Patients Receiving Chemotherapy  Today you received the following chemotherapy agents Taxol  To help prevent nausea and vomiting after your treatment, we encourage you to take your nausea medication as directed/prescribed.   If you develop nausea and vomiting that is not controlled by your nausea medication, call the clinic.   BELOW ARE SYMPTOMS THAT SHOULD BE REPORTED IMMEDIATELY:  *FEVER GREATER THAN 100.5 F  *CHILLS WITH OR WITHOUT FEVER  NAUSEA AND VOMITING THAT IS NOT CONTROLLED WITH YOUR NAUSEA MEDICATION  *UNUSUAL SHORTNESS OF BREATH  *UNUSUAL BRUISING OR BLEEDING  TENDERNESS IN MOUTH AND THROAT WITH OR WITHOUT PRESENCE OF ULCERS  *URINARY PROBLEMS  *BOWEL PROBLEMS  UNUSUAL RASH Items with * indicate a potential emergency and should be followed up as soon as possible.  Feel free to call the clinic you have any questions or concerns. The clinic phone number is [phone].    

## 2014-03-20 ENCOUNTER — Other Ambulatory Visit (HOSPITAL_BASED_OUTPATIENT_CLINIC_OR_DEPARTMENT_OTHER): Payer: BLUE CROSS/BLUE SHIELD

## 2014-03-20 ENCOUNTER — Ambulatory Visit: Payer: BLUE CROSS/BLUE SHIELD

## 2014-03-20 ENCOUNTER — Telehealth: Payer: Self-pay | Admitting: Hematology and Oncology

## 2014-03-20 ENCOUNTER — Encounter: Payer: Self-pay | Admitting: Hematology and Oncology

## 2014-03-20 ENCOUNTER — Ambulatory Visit (HOSPITAL_BASED_OUTPATIENT_CLINIC_OR_DEPARTMENT_OTHER): Payer: BLUE CROSS/BLUE SHIELD | Admitting: Hematology and Oncology

## 2014-03-20 ENCOUNTER — Ambulatory Visit (HOSPITAL_BASED_OUTPATIENT_CLINIC_OR_DEPARTMENT_OTHER): Payer: BLUE CROSS/BLUE SHIELD

## 2014-03-20 VITALS — BP 139/75 | HR 91 | Temp 98.0°F | Resp 18 | Ht 73.0 in | Wt 116.4 lb

## 2014-03-20 DIAGNOSIS — Z5111 Encounter for antineoplastic chemotherapy: Secondary | ICD-10-CM

## 2014-03-20 DIAGNOSIS — D630 Anemia in neoplastic disease: Secondary | ICD-10-CM

## 2014-03-20 DIAGNOSIS — C109 Malignant neoplasm of oropharynx, unspecified: Secondary | ICD-10-CM

## 2014-03-20 DIAGNOSIS — Z95828 Presence of other vascular implants and grafts: Secondary | ICD-10-CM

## 2014-03-20 DIAGNOSIS — R070 Pain in throat: Secondary | ICD-10-CM

## 2014-03-20 LAB — COMPREHENSIVE METABOLIC PANEL (CC13)
ALBUMIN: 3.2 g/dL — AB (ref 3.5–5.0)
ALT: 21 U/L (ref 0–55)
ANION GAP: 8 meq/L (ref 3–11)
AST: 23 U/L (ref 5–34)
Alkaline Phosphatase: 58 U/L (ref 40–150)
BUN: 5 mg/dL — ABNORMAL LOW (ref 7.0–26.0)
CALCIUM: 9.1 mg/dL (ref 8.4–10.4)
CO2: 27 mEq/L (ref 22–29)
Chloride: 102 mEq/L (ref 98–109)
Creatinine: 0.6 mg/dL — ABNORMAL LOW (ref 0.7–1.3)
GLUCOSE: 122 mg/dL (ref 70–140)
POTASSIUM: 4.4 meq/L (ref 3.5–5.1)
Sodium: 137 mEq/L (ref 136–145)
TOTAL PROTEIN: 7.1 g/dL (ref 6.4–8.3)
Total Bilirubin: 0.34 mg/dL (ref 0.20–1.20)

## 2014-03-20 LAB — CBC WITH DIFFERENTIAL/PLATELET
BASO%: 1.2 % (ref 0.0–2.0)
BASOS ABS: 0.1 10*3/uL (ref 0.0–0.1)
EOS%: 3.5 % (ref 0.0–7.0)
Eosinophils Absolute: 0.2 10*3/uL (ref 0.0–0.5)
HEMATOCRIT: 32.8 % — AB (ref 38.4–49.9)
HGB: 10.4 g/dL — ABNORMAL LOW (ref 13.0–17.1)
LYMPH%: 13.8 % — AB (ref 14.0–49.0)
MCH: 29.1 pg (ref 27.2–33.4)
MCHC: 31.7 g/dL — ABNORMAL LOW (ref 32.0–36.0)
MCV: 91.6 fL (ref 79.3–98.0)
MONO#: 0.9 10*3/uL (ref 0.1–0.9)
MONO%: 13.8 % (ref 0.0–14.0)
NEUT#: 4.5 10*3/uL (ref 1.5–6.5)
NEUT%: 67.7 % (ref 39.0–75.0)
Platelets: 289 10*3/uL (ref 140–400)
RBC: 3.58 10*6/uL — ABNORMAL LOW (ref 4.20–5.82)
RDW: 21.6 % — AB (ref 11.0–14.6)
WBC: 6.6 10*3/uL (ref 4.0–10.3)
lymph#: 0.9 10*3/uL (ref 0.9–3.3)

## 2014-03-20 LAB — TECHNOLOGIST REVIEW

## 2014-03-20 MED ORDER — SODIUM CHLORIDE 0.9 % IJ SOLN
10.0000 mL | INTRAMUSCULAR | Status: DC | PRN
  Filled 2014-03-20: qty 10

## 2014-03-20 MED ORDER — DIPHENHYDRAMINE HCL 50 MG/ML IJ SOLN
INTRAMUSCULAR | Status: AC
  Filled 2014-03-20: qty 1

## 2014-03-20 MED ORDER — DEXAMETHASONE SODIUM PHOSPHATE 20 MG/5ML IJ SOLN
INTRAMUSCULAR | Status: AC
  Filled 2014-03-20: qty 5

## 2014-03-20 MED ORDER — SODIUM CHLORIDE 0.9 % IV SOLN
Freq: Once | INTRAVENOUS | Status: AC
  Administered 2014-03-20: 13:00:00 via INTRAVENOUS

## 2014-03-20 MED ORDER — DIPHENHYDRAMINE HCL 50 MG/ML IJ SOLN
50.0000 mg | Freq: Once | INTRAMUSCULAR | Status: AC
  Administered 2014-03-20: 50 mg via INTRAVENOUS

## 2014-03-20 MED ORDER — FAMOTIDINE IN NACL 20-0.9 MG/50ML-% IV SOLN
20.0000 mg | Freq: Once | INTRAVENOUS | Status: AC
  Administered 2014-03-20: 20 mg via INTRAVENOUS

## 2014-03-20 MED ORDER — FAMOTIDINE IN NACL 20-0.9 MG/50ML-% IV SOLN
INTRAVENOUS | Status: AC
  Filled 2014-03-20: qty 50

## 2014-03-20 MED ORDER — DEXAMETHASONE SODIUM PHOSPHATE 20 MG/5ML IJ SOLN
20.0000 mg | Freq: Once | INTRAMUSCULAR | Status: AC
  Administered 2014-03-20: 20 mg via INTRAVENOUS

## 2014-03-20 MED ORDER — PACLITAXEL CHEMO INJECTION 300 MG/50ML
80.0000 mg/m2 | Freq: Once | INTRAVENOUS | Status: AC
  Administered 2014-03-20: 126 mg via INTRAVENOUS
  Filled 2014-03-20: qty 21

## 2014-03-20 MED ORDER — HEPARIN SOD (PORK) LOCK FLUSH 100 UNIT/ML IV SOLN
500.0000 [IU] | Freq: Once | INTRAVENOUS | Status: DC | PRN
  Filled 2014-03-20: qty 5

## 2014-03-20 MED ORDER — MORPHINE SULFATE 30 MG PO TABS: 30.0000 mg | ORAL_TABLET | Freq: Four times a day (QID) | ORAL | Status: DC | PRN

## 2014-03-20 NOTE — Assessment & Plan Note (Signed)
He tolerated Taxol well without side effects. We will proceed with week 3 treatment and he would take 1 week break. I'll see him back in 4 weeks for further assessment.  I plan to restage his disease after 3 cycles of treatment.

## 2014-03-20 NOTE — Progress Notes (Signed)
[facility] OFFICE PROGRESS NOTE  Patient Care Team: No Pcp Per Patient as PCP - General (General Practice) Heath Lark, MD as Consulting Physician (Hematology and Oncology) Ascencion Dike, MD as Consulting Physician (Otolaryngology) Brooks Sailors, RN as Oncology Nurse Navigator (Oncology) Eppie Gibson, MD as Attending Physician (Radiation Oncology) Karie Mainland, RD as Dietitian (Nutrition)  SUMMARY OF ONCOLOGIC HISTORY: Oncology History   Oropharyngeal cancer, HPV negative   Primary site: Pharynx - Oropharynx   Staging method: AJCC 7th Edition   Clinical: Stage IVC (T4b, N3, M1) signed by Heath Lark, MD on 07/01/2013  8:32 PM   Summary: Stage IVC (T4b, N3, M1)         Oropharyngeal cancer   05/06/2013 Imaging 4.2 x 4.7 x 6.1 cm heterogeneous right neck mass in the right level 2 & level 3 stations. It invades the carotid space, obstructing the internal jugular vein. The primary lesion may be along the inferior aspect of the right palatine tonsil    05/22/2013 Procedure Laryngoscopy revealed abnormalities beneath the right tonsil area. Unfortunately tonsillectomy and right neck biopsy came back benign tissue.   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   06/24/2013 Surgery He underwent multiple extraction of tooth numbers 2, 3, 6, 8, 9, 11, 12, 13, 14, 15, 19, 20, 22, 23, 24, 25, 26, 27, and 28 along with 4 Quadrants of alveoloplasty   06/28/2013 Imaging PET/CT scan results show widespread pulmonary metastasis.   07/11/2013 - 11/07/2013 Chemotherapy The patient received palliative chemotherapy with 5-FU, carboplatin and weekly Erbitux.   09/09/2013 Imaging Repeat PET CT scan show good response to treatment.   11/14/2013 Imaging Repeat PET/CT showed disease progression   11/26/2013 - 12/18/2013 Radiation Therapy He received palliative radiation therapy to the neck and the back.   01/15/2014 Imaging Repeat PET/CT scan showed improvement in  disease control around his neck but significant progression of pulmonary metastases   01/23/2014 -  Chemotherapy He received palliative treatment with Taxol weekly    INTERVAL HISTORY: Please see below for problem oriented charting. He is seen prior to treatment today. He denies recent side effects of treatment. He continues a mild neck pain, well-controlled with current prescription morphine. He denies peripheral neuropathy.  REVIEW OF SYSTEMS:   Constitutional: Denies fevers, chills or abnormal weight loss Eyes: Denies blurriness of vision Ears, nose, mouth, throat, and face: Denies mucositis or sore throat Respiratory: Denies cough, dyspnea or wheezes Cardiovascular: Denies palpitation, chest discomfort or lower extremity swelling Gastrointestinal:  Denies nausea, heartburn or change in bowel habits Skin: Denies abnormal skin rashes Lymphatics: Denies new lymphadenopathy or easy bruising Neurological:Denies numbness, tingling or new weaknesses Behavioral/Psych: Mood is stable, no new changes  All other systems were reviewed with the patient and are negative.  I have reviewed the past medical history, past surgical history, social history and family history with the patient and they are unchanged from previous note.  ALLERGIES:  has No Known Allergies.  MEDICATIONS:  Current Outpatient Prescriptions  Medication Sig Dispense Refill  . Diphenhyd-Lidocaine-Nystatin (FIRST-BXN MOUTHWASH) SUSP Take 5 mLs by mouth 4 (four) times daily. Verbal order from Dr. Alvy Bimler for these ingredients plus Maalox in a 1:1:1:1 concentration.  Called to [employer] for Quantity Sufficient. 1 Bottle 2  . docusate sodium (COLACE) 100 MG capsule Take 1 capsule (100 mg total) by mouth 2 (two) times daily. 60 capsule 0  . lidocaine-prilocaine (EMLA) cream Apply 1 application topically as needed.  30 g 6  . morphine (MSIR) 30 MG tablet Take 1 tablet (30 mg total) by mouth every 6 (six) hours as needed  for severe pain. 90 tablet 0  . Ondansetron HCl (ZOFRAN PO) Take by mouth as needed.    . Prochlorperazine Maleate (COMPAZINE PO) Take by mouth as needed.     No current facility-administered medications for this visit.   Facility-Administered Medications Ordered in Other Visits  Medication Dose Route Frequency Provider Last Rate Last Dose  . sodium chloride 0.9 % injection 10 mL  10 mL Intravenous PRN Heath Lark, MD        PHYSICAL EXAMINATION: ECOG PERFORMANCE STATUS: 0 - Asymptomatic  Filed Vitals:   03/20/14 1137  BP: 139/75  Pulse: 91  Temp: 98 F (36.7 C)  Resp: 18   Filed Weights   03/20/14 1137  Weight: 116 lb 6.4 oz (52.799 kg)    GENERAL:alert, no distress and comfortable. He looks thin and cachectic SKIN: skin color, texture, turgor are normal, no rashes or significant lesions EYES: normal, Conjunctiva are pink and non-injected, sclera clear OROPHARYNX:no exudate, no erythema and lips, buccal mucosa, and tongue normal  NECK: Persistent mass in the right side of the neck with associated fibrosis LYMPH:  no palpable lymphadenopathy in the cervical, axillary or inguinal LUNGS: clear to auscultation and percussion with normal breathing effort HEART: regular rate & rhythm and no murmurs and no lower extremity edema ABDOMEN:abdomen soft, non-tender and normal bowel sounds Musculoskeletal:no cyanosis of digits and no clubbing  NEURO: alert & oriented x 3 with fluent speech, no focal motor/sensory deficits  LABORATORY DATA:  I have reviewed the data as listed    Component Value Date/Time   NA 137 03/20/2014 1104   K 4.4 03/20/2014 1104   CO2 27 03/20/2014 1104   GLUCOSE 122 03/20/2014 1104   BUN 5.0* 03/20/2014 1104   CREATININE 0.6* 03/20/2014 1104   CALCIUM 9.1 03/20/2014 1104   PROT 7.1 03/20/2014 1104   ALBUMIN 3.2* 03/20/2014 1104   AST 23 03/20/2014 1104   ALT 21 03/20/2014 1104   ALKPHOS 58 03/20/2014 1104   BILITOT 0.34 03/20/2014 1104    No  results found for: SPEP, UPEP  Lab Results  Component Value Date   WBC 6.6 03/20/2014   NEUTROABS 4.5 03/20/2014   HGB 10.4* 03/20/2014   HCT 32.8* 03/20/2014   MCV 91.6 03/20/2014   PLT 289 03/20/2014      Chemistry      Component Value Date/Time   NA 137 03/20/2014 1104   K 4.4 03/20/2014 1104   CO2 27 03/20/2014 1104   BUN 5.0* 03/20/2014 1104   CREATININE 0.6* 03/20/2014 1104      Component Value Date/Time   CALCIUM 9.1 03/20/2014 1104   ALKPHOS 58 03/20/2014 1104   AST 23 03/20/2014 1104   ALT 21 03/20/2014 1104   BILITOT 0.34 03/20/2014 1104      ASSESSMENT & PLAN:  Oropharyngeal cancer He tolerated Taxol well without side effects. We will proceed with week 3 treatment and he would take 1 week break. I'll see him back in 4 weeks for further assessment.  I plan to restage his disease after 3 cycles of treatment.    Anemia in neoplastic disease This is likely due to recent treatment. The patient denies recent history of bleeding such as epistaxis, hematuria or hematochezia. He is asymptomatic from the anemia. I will observe for now.  He does not require transfusion now.  Throat pain His pain appeared to be well controlled with current prescription of morphine.  I have refilled his prescription today. We discussed narcotic refill policy.      Orders Placed This Encounter  Procedures  . NM PET Image Restag (PS) Skull Base To Thigh    Standing Status: Future     Number of Occurrences:      Standing Expiration Date: 05/20/2015    Order Specific Question:  Reason for Exam (SYMPTOM  OR DIAGNOSIS REQUIRED)    Answer:  staging oropharyngeal ca, assess response to Rx    Order Specific Question:  Preferred imaging location?    Answer:  [facility]   All questions were answered. The patient knows to call the clinic with any problems, questions or concerns. No barriers to learning was detected. I spent 25 minutes counseling the patient face to face. The  total time spent in the appointment was 30 minutes and more than 50% was on counseling and review of test results     [facility], Aubriauna Riner, MD 03/20/2014 12:13 PM

## 2014-03-20 NOTE — Assessment & Plan Note (Signed)
His pain appeared to be well controlled with current prescription of morphine.  I have refilled his prescription today. We discussed narcotic refill policy.

## 2014-03-20 NOTE — Assessment & Plan Note (Signed)
This is likely due to recent treatment. The patient denies recent history of bleeding such as epistaxis, hematuria or hematochezia. He is asymptomatic from the anemia. I will observe for now.  He does not require transfusion now. 

## 2014-03-20 NOTE — Telephone Encounter (Signed)
added appt pt will get sched from chemo

## 2014-03-20 NOTE — Patient Instructions (Addendum)
[facility] Discharge Instructions for Patients Receiving Chemotherapy   Today you received the following chemotherapy agents Taxol To help prevent nausea and vomiting after your treatment, we encourage you to take your nausea medication: Compazine. Take one every six hours as needed.   If you develop nausea and vomiting that is not controlled by your nausea medication, call the clinic.   BELOW ARE SYMPTOMS THAT SHOULD BE REPORTED IMMEDIATELY:  *FEVER GREATER THAN 100.5 F  *CHILLS WITH OR WITHOUT FEVER  NAUSEA AND VOMITING THAT IS NOT CONTROLLED WITH YOUR NAUSEA MEDICATION  *UNUSUAL SHORTNESS OF BREATH  *UNUSUAL BRUISING OR BLEEDING  TENDERNESS IN MOUTH AND THROAT WITH OR WITHOUT PRESENCE OF ULCERS  *URINARY PROBLEMS  *BOWEL PROBLEMS  UNUSUAL RASH Items with * indicate a potential emergency and should be followed up as soon as possible.  Feel free to call the clinic should you have any questions or concerns. The clinic phone number is [phone].

## 2014-03-20 NOTE — Progress Notes (Signed)
Patient paged and last name called three times without a response. Dr. Calton Dach nurse Jill Alexanders, RN made aware. Patient arrived to Office Visit with Dr. Alvy Bimler.

## 2014-03-21 ENCOUNTER — Ambulatory Visit: Payer: BLUE CROSS/BLUE SHIELD | Attending: Radiation Oncology

## 2014-03-21 DIAGNOSIS — R1310 Dysphagia, unspecified: Secondary | ICD-10-CM | POA: Insufficient documentation

## 2014-03-21 NOTE — Patient Instructions (Signed)
You must complete HEP x2-3/day

## 2014-03-21 NOTE — Therapy (Signed)
[facility] [address] [place], [place], [address] Phone: [phone]   Fax:  [phone]  Speech Language Pathology Treatment  Patient Details  Name: Derek Morgan MRN: [id number] Date of Birth: July 03, 1955 Referring Provider:  Eppie Gibson, MD  Encounter Date: 03/21/2014      End of Session - 03/21/14 0843    Visit Number 6   Number of Visits 6   Date for SLP Re-Evaluation 05/25/14   SLP Start Time 0802   SLP Stop Time  0837   SLP Time Calculation (min) 35 min   Activity Tolerance Patient tolerated treatment well      Past Medical History  Diagnosis Date  . Heavy smoker   . Hoarseness of voice   . Mass in neck 04/2013    right/ Metastatic Squamous Cell Carcinoma  . Loose, teeth 06/24/2013  . Weight loss 06/17/2013  . Throat pain 06/17/2013  . Shortness of breath     with exertion at times  . Cancer     oral, pharyngeal  . History of radiation therapy 11/26/13- 12/18/13    right neck mass/C5 lesion 37.5 Gy 15 fractions    Past Surgical History  Procedure Laterality Date  . Broken leg  1998    orif ankle-left-plated  . Direct laryngoscopy N/A 05/22/2013    Procedure: DIRECT LARYNGOSCOPY;  Surgeon: Ascencion Dike, MD;  Location: [facility] OR;  Service: ENT;  Laterality: N/A;  . Tonsillectomy Right 05/22/2013    Procedure: RIGHT TONSILLECTOMY;  Surgeon: Ascencion Dike, MD;  Location: [facility];  Service: ENT;  Laterality: Right;  . Mass biopsy Right 05/22/2013    Procedure:  INCISIONAL BIOPSY NECK MASS;  Surgeon: Ascencion Dike, MD;  Location: [facility];  Service: ENT;  Laterality: Right;  . Mass biopsy Right 06/07/2013    Procedure: EXCISIONAL BIOPSY OF RIGHT NECK MASS;  Surgeon: Ascencion Dike, MD;  Location: [facility];  Service: ENT;  Laterality: Right;  . Multiple extractions with alveoloplasty N/A 06/24/2013    Procedure: Extraction of tooth #'s 2,3,6,8,9,11,12,13,14,15,19,20,22,23,24,25,26,27,28 with alveoloplasty.;  Surgeon: Lenn Cal,  DDS;  Location: [facility];  Service: Oral Surgery;  Laterality: N/A;  . Portacath placement Left 07/04/2013    Procedure: INSERTION PORT-A-CATH;  Surgeon: Merrie Roof, MD;  Location: [facility];  Service: General;  Laterality: Left;    There were no vitals taken for this visit.  Visit Diagnosis: Dysphagia - Plan: SLP plan of care cert/re-cert      Subjective Assessment - 03/21/14 0808    Symptoms Pt's frequency of HEP has  not improved since last time. Thick saliva reported to impede pt's ability to eat solids however pt had [employer] pizza with small, small bites with success.             ADULT SLP TREATMENT - 03/21/14 0811    General Information   Behavior/Cognition Cooperative;Alert;Pleasant mood   Treatment Provided   Treatment provided Dysphagia   Dysphagia Treatment   Oral Cavity - Dentition Edentulous   Treatment Methods Skilled observation;Therapeutic exercise   Patient observed directly with PO's Yes   Type of PO's observed Dysphagia 3 (soft);Thin liquids   Oral Phase Signs & Symptoms --  no overt s/s today with solids or liquids   Pharyngeal Phase Signs & Symptoms Immediate cough  with 1/14 swallows   Other treatment/comments Pt with mild-mod muscle fibrosis in radiated areas. SLP assessed pt's success with HEP; mod A needed occasionally. SLP strongly  encouraged pt to complete x2-3/day to inhibit further muscle fibrosis and/or to break down current fibrosis After multiple swallows of POs, thyroid elevation decr'd slightly. Pt stated he is attempting to swallow with effort with all POs.   Pain Assessment   Pain Assessment No/denies pain   Assessment / Recommendations / Plan   Plan Continue with current plan of care  Pt agreed to complete HEP daily,SLP strongly rec x2-3/day   Dysphagia Recommendations   Diet recommendations Dysphagia 3 (mechanical soft);Thin liquid   Progression Toward Goals   Progression toward goals Progressing toward goals          SLP Education  - 03/21/14 0843    Education Details HEP, effects of not completing HEP as prescribed   Person(s) Educated Patient   Methods Explanation;Demonstration   Comprehension Verbalized understanding;Returned demonstration          SLP Short Term Goals - 03/21/14 0845    SLP SHORT TERM GOAL #1   Title pt will complete HEP with rare min A (03-25-14)   Time 2   Period --  visits   Status On-going  Extended    SLP SHORT TERM GOAL #2   Title pt will tell SLP why he is completing HEP    Status Achieved  08-26-13          SLP Long Term Goals - 03/21/14 0847    SLP LONG TERM GOAL #1   Title copmlete HEP with modified independence (05-25-14)   Time 3   Period --  visits   Status On-going  extended   SLP LONG TERM GOAL #2   Title tell SLP three s/s aspiration PNA (05-25-14)   Time 3   Period --  visits   Status On-going  extended   SLP LONG TERM GOAL #3   Title tell SLP why a food journal could benefit pt during return to PO diet after head/neck rad (05-25-14)   Status Deferred  due to pt eating desired POs          Plan - 03/21/14 0844    Clinical Impression Statement Pt again presents today with frequency of HEP completion of less than prescribed. Pt was independent with HEP by end of session, but this took mod A occasionally.   Speech Therapy Frequency --  Monthly   Duration --  2 more visits   Treatment/Interventions Aspiration precaution training;Pharyngeal strengthening exercises;Oral motor exercises;SLP instruction and feedback;Diet toleration management by SLP   Potential to Achieve Goals Good   Potential Considerations Cooperation/participation level        Problem List Patient Active Problem List   Diagnosis Date Noted  . DNR (do not resuscitate) discussion 12/23/2013  . Weakness generalized 12/23/2013  . Mucositis due to antineoplastic therapy 12/05/2013  . 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 Anemia in neoplastic  disease 08/22/2013  . Leukopenia due to antineoplastic chemotherapy 08/22/2013  . Thrombocytopenia due to drugs 08/22/2013  . Skin rash 08/01/2013  . Dental caries 06/24/2013  . Weight loss 06/17/2013  . 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 Tobacco abuse 06/17/2013  . Throat pain 06/17/2013    [facility] , SLP  03/21/2014, 8:57 AM  [facility] [address] [place], [place], [address] Phone: [phone]   Fax:  [phone]

## 2014-03-27 ENCOUNTER — Other Ambulatory Visit (HOSPITAL_BASED_OUTPATIENT_CLINIC_OR_DEPARTMENT_OTHER): Payer: BLUE CROSS/BLUE SHIELD

## 2014-03-27 ENCOUNTER — Ambulatory Visit: Payer: BLUE CROSS/BLUE SHIELD

## 2014-03-27 ENCOUNTER — Encounter: Payer: Self-pay | Admitting: *Deleted

## 2014-03-27 ENCOUNTER — Ambulatory Visit (HOSPITAL_BASED_OUTPATIENT_CLINIC_OR_DEPARTMENT_OTHER): Payer: BLUE CROSS/BLUE SHIELD

## 2014-03-27 ENCOUNTER — Other Ambulatory Visit: Payer: Self-pay

## 2014-03-27 DIAGNOSIS — C109 Malignant neoplasm of oropharynx, unspecified: Secondary | ICD-10-CM

## 2014-03-27 DIAGNOSIS — Z95828 Presence of other vascular implants and grafts: Secondary | ICD-10-CM

## 2014-03-27 DIAGNOSIS — Z5111 Encounter for antineoplastic chemotherapy: Secondary | ICD-10-CM

## 2014-03-27 LAB — COMPREHENSIVE METABOLIC PANEL (CC13)
ALBUMIN: 2.9 g/dL — AB (ref 3.5–5.0)
ALK PHOS: 62 U/L (ref 40–150)
ALT: 35 U/L (ref 0–55)
ANION GAP: 11 meq/L (ref 3–11)
AST: 28 U/L (ref 5–34)
BUN: 9.8 mg/dL (ref 7.0–26.0)
CHLORIDE: 100 meq/L (ref 98–109)
CO2: 24 mEq/L (ref 22–29)
Calcium: 9.1 mg/dL (ref 8.4–10.4)
Creatinine: 0.6 mg/dL — ABNORMAL LOW (ref 0.7–1.3)
GLUCOSE: 125 mg/dL (ref 70–140)
Potassium: 4.1 mEq/L (ref 3.5–5.1)
SODIUM: 135 meq/L — AB (ref 136–145)
Total Bilirubin: 0.27 mg/dL (ref 0.20–1.20)
Total Protein: 7.1 g/dL (ref 6.4–8.3)

## 2014-03-27 LAB — CBC WITH DIFFERENTIAL/PLATELET
BASO%: 0.3 % (ref 0.0–2.0)
Basophils Absolute: 0 10*3/uL (ref 0.0–0.1)
EOS%: 2.2 % (ref 0.0–7.0)
Eosinophils Absolute: 0.3 10*3/uL (ref 0.0–0.5)
HCT: 30.6 % — ABNORMAL LOW (ref 38.4–49.9)
HGB: 9.8 g/dL — ABNORMAL LOW (ref 13.0–17.1)
LYMPH#: 0.9 10*3/uL (ref 0.9–3.3)
LYMPH%: 7.2 % — AB (ref 14.0–49.0)
MCH: 29.3 pg (ref 27.2–33.4)
MCHC: 32 g/dL (ref 32.0–36.0)
MCV: 91.6 fL (ref 79.3–98.0)
MONO#: 1.2 10*3/uL — ABNORMAL HIGH (ref 0.1–0.9)
MONO%: 9.4 % (ref 0.0–14.0)
NEUT#: 10.3 10*3/uL — ABNORMAL HIGH (ref 1.5–6.5)
NEUT%: 80.9 % — AB (ref 39.0–75.0)
PLATELETS: 287 10*3/uL (ref 140–400)
RBC: 3.34 10*6/uL — ABNORMAL LOW (ref 4.20–5.82)
RDW: 21.5 % — AB (ref 11.0–14.6)
WBC: 12.7 10*3/uL — ABNORMAL HIGH (ref 4.0–10.3)

## 2014-03-27 LAB — TECHNOLOGIST REVIEW

## 2014-03-27 MED ORDER — FAMOTIDINE IN NACL 20-0.9 MG/50ML-% IV SOLN
20.0000 mg | Freq: Once | INTRAVENOUS | Status: AC
  Administered 2014-03-27: 20 mg via INTRAVENOUS

## 2014-03-27 MED ORDER — HEPARIN SOD (PORK) LOCK FLUSH 100 UNIT/ML IV SOLN
500.0000 [IU] | Freq: Once | INTRAVENOUS | Status: AC | PRN
  Administered 2014-03-27: 500 [IU]
  Filled 2014-03-27: qty 5

## 2014-03-27 MED ORDER — PACLITAXEL CHEMO INJECTION 300 MG/50ML
80.0000 mg/m2 | Freq: Once | INTRAVENOUS | Status: AC
  Administered 2014-03-27: 126 mg via INTRAVENOUS
  Filled 2014-03-27: qty 21

## 2014-03-27 MED ORDER — DIPHENHYDRAMINE HCL 50 MG/ML IJ SOLN
50.0000 mg | Freq: Once | INTRAMUSCULAR | Status: AC
  Administered 2014-03-27: 50 mg via INTRAVENOUS

## 2014-03-27 MED ORDER — SODIUM CHLORIDE 0.9 % IV SOLN
Freq: Once | INTRAVENOUS | Status: AC
  Administered 2014-03-27: 10:00:00 via INTRAVENOUS

## 2014-03-27 MED ORDER — SODIUM CHLORIDE 0.9 % IV SOLN
20.0000 mg | Freq: Once | INTRAVENOUS | Status: AC
  Administered 2014-03-27: 20 mg via INTRAVENOUS
  Filled 2014-03-27: qty 2

## 2014-03-27 MED ORDER — FAMOTIDINE IN NACL 20-0.9 MG/50ML-% IV SOLN
INTRAVENOUS | Status: AC
  Filled 2014-03-27: qty 50

## 2014-03-27 MED ORDER — DIPHENHYDRAMINE HCL 50 MG/ML IJ SOLN
INTRAMUSCULAR | Status: AC
  Filled 2014-03-27: qty 1

## 2014-03-27 MED ORDER — SODIUM CHLORIDE 0.9 % IJ SOLN
10.0000 mL | INTRAMUSCULAR | Status: DC | PRN
  Administered 2014-03-27: 10 mL via INTRAVENOUS
  Filled 2014-03-27: qty 10

## 2014-03-27 MED ORDER — SODIUM CHLORIDE 0.9 % IJ SOLN
10.0000 mL | INTRAMUSCULAR | Status: DC | PRN
  Administered 2014-03-27: 10 mL
  Filled 2014-03-27: qty 10

## 2014-03-27 NOTE — Patient Instructions (Signed)

## 2014-03-27 NOTE — Progress Notes (Signed)
To provide support and encouragement, care continuity and to assess for needs, met with patient in Infusion. He reported he is tolerating chemo but having increasing episodes of N&V which are controlled with antiemetics. He stated he continues to work. He did not express any needs or concerns at this time, I encouraged him to contact me if that changes, he verbalized understanding.  Gayleen Orem, RN, BSN, [facility] [phone]

## 2014-03-27 NOTE — Patient Instructions (Signed)
[facility] Discharge Instructions for Patients Receiving Chemotherapy  Today you received the following chemotherapy agents: Taxol.  To help prevent nausea and vomiting after your treatment, we encourage you to take your nausea medication as prescribed.   If you develop nausea and vomiting that is not controlled by your nausea medication, call the clinic.   BELOW ARE SYMPTOMS THAT SHOULD BE REPORTED IMMEDIATELY:  *FEVER GREATER THAN 100.5 F  *CHILLS WITH OR WITHOUT FEVER  NAUSEA AND VOMITING THAT IS NOT CONTROLLED WITH YOUR NAUSEA MEDICATION  *UNUSUAL SHORTNESS OF BREATH  *UNUSUAL BRUISING OR BLEEDING  TENDERNESS IN MOUTH AND THROAT WITH OR WITHOUT PRESENCE OF ULCERS  *URINARY PROBLEMS  *BOWEL PROBLEMS  UNUSUAL RASH Items with * indicate a potential emergency and should be followed up as soon as possible.  Feel free to call the clinic you have any questions or concerns. The clinic phone number is [phone].    

## 2014-04-03 ENCOUNTER — Other Ambulatory Visit: Payer: Self-pay

## 2014-04-03 ENCOUNTER — Telehealth: Payer: Self-pay | Admitting: *Deleted

## 2014-04-03 ENCOUNTER — Ambulatory Visit: Payer: Self-pay

## 2014-04-03 NOTE — Telephone Encounter (Signed)
Pt has not showed up for lab and chemo appt today.   LVM on his cell phone to see if he is ok.  Asked him to call nurse back when he gets message.

## 2014-04-03 NOTE — Telephone Encounter (Signed)
Was able to reach pt at his work.  He says he decided to take the day off chemo because he was feeling bad but he is starting to feel a little better now.  He plans to keep his appt for PET on 3/30 and f/u w/ Dr. Alvy Bimler as scheduled on 3/31.   Instructed pt to call us if he needs anything before his next visit.  He verbalized understanding.

## 2014-04-16 ENCOUNTER — Ambulatory Visit (HOSPITAL_COMMUNITY)
Admission: RE | Admit: 2014-04-16 | Discharge: 2014-04-16 | Disposition: A | Discharge status: Home / Self Care | Payer: BLUE CROSS/BLUE SHIELD | Source: Ambulatory Visit | Attending: Hematology and Oncology | Admitting: Hematology and Oncology

## 2014-04-16 DIAGNOSIS — C069 Malignant neoplasm of mouth, unspecified: Secondary | ICD-10-CM | POA: Insufficient documentation

## 2014-04-16 DIAGNOSIS — C109 Malignant neoplasm of oropharynx, unspecified: Secondary | ICD-10-CM

## 2014-04-16 LAB — GLUCOSE, CAPILLARY: Glucose-Capillary: 122 mg/dL — ABNORMAL HIGH (ref 70–99)

## 2014-04-16 MED ORDER — FLUDEOXYGLUCOSE F - 18 (FDG) INJECTION
5.8000 | Freq: Once | INTRAVENOUS | Status: AC | PRN
  Administered 2014-04-16: 5.8 via INTRAVENOUS

## 2014-04-17 ENCOUNTER — Encounter: Payer: Self-pay | Admitting: Hematology and Oncology

## 2014-04-17 ENCOUNTER — Ambulatory Visit (HOSPITAL_BASED_OUTPATIENT_CLINIC_OR_DEPARTMENT_OTHER): Payer: BLUE CROSS/BLUE SHIELD | Admitting: Hematology and Oncology

## 2014-04-17 ENCOUNTER — Ambulatory Visit: Payer: BLUE CROSS/BLUE SHIELD

## 2014-04-17 ENCOUNTER — Telehealth: Payer: Self-pay | Admitting: *Deleted

## 2014-04-17 ENCOUNTER — Ambulatory Visit: Payer: Self-pay

## 2014-04-17 ENCOUNTER — Other Ambulatory Visit (HOSPITAL_BASED_OUTPATIENT_CLINIC_OR_DEPARTMENT_OTHER): Payer: BLUE CROSS/BLUE SHIELD

## 2014-04-17 VITALS — BP 119/63 | HR 84 | Temp 98.5°F | Resp 18 | Ht 73.0 in | Wt 110.5 lb

## 2014-04-17 DIAGNOSIS — J181 Lobar pneumonia, unspecified organism: Secondary | ICD-10-CM

## 2014-04-17 DIAGNOSIS — D630 Anemia in neoplastic disease: Secondary | ICD-10-CM

## 2014-04-17 DIAGNOSIS — E070 Hypersecretion of calcitonin: Secondary | ICD-10-CM | POA: Diagnosis not present

## 2014-04-17 DIAGNOSIS — C109 Malignant neoplasm of oropharynx, unspecified: Secondary | ICD-10-CM | POA: Diagnosis not present

## 2014-04-17 DIAGNOSIS — R0902 Hypoxemia: Secondary | ICD-10-CM

## 2014-04-17 DIAGNOSIS — J188 Other pneumonia, unspecified organism: Secondary | ICD-10-CM | POA: Diagnosis not present

## 2014-04-17 DIAGNOSIS — J189 Pneumonia, unspecified organism: Secondary | ICD-10-CM

## 2014-04-17 DIAGNOSIS — Z95828 Presence of other vascular implants and grafts: Secondary | ICD-10-CM

## 2014-04-17 DIAGNOSIS — R070 Pain in throat: Secondary | ICD-10-CM

## 2014-04-17 LAB — COMPREHENSIVE METABOLIC PANEL (CC13)
ALBUMIN: 2.4 g/dL — AB (ref 3.5–5.0)
ALT: 26 U/L (ref 0–55)
AST: 24 U/L (ref 5–34)
Alkaline Phosphatase: 56 U/L (ref 40–150)
Anion Gap: 10 mEq/L (ref 3–11)
BUN: 9.5 mg/dL (ref 7.0–26.0)
CHLORIDE: 100 meq/L (ref 98–109)
CO2: 26 mEq/L (ref 22–29)
CREATININE: 0.6 mg/dL — AB (ref 0.7–1.3)
Calcium: 8.9 mg/dL (ref 8.4–10.4)
GLUCOSE: 113 mg/dL (ref 70–140)
Potassium: 4.5 mEq/L (ref 3.5–5.1)
Sodium: 136 mEq/L (ref 136–145)
Total Bilirubin: 0.21 mg/dL (ref 0.20–1.20)
Total Protein: 7 g/dL (ref 6.4–8.3)

## 2014-04-17 LAB — CBC WITH DIFFERENTIAL/PLATELET
BASO%: 0.6 % (ref 0.0–2.0)
Basophils Absolute: 0.1 10*3/uL (ref 0.0–0.1)
EOS%: 4.6 % (ref 0.0–7.0)
Eosinophils Absolute: 0.5 10*3/uL (ref 0.0–0.5)
HCT: 31.7 % — ABNORMAL LOW (ref 38.4–49.9)
HGB: 9.8 g/dL — ABNORMAL LOW (ref 13.0–17.1)
LYMPH%: 8.5 % — ABNORMAL LOW (ref 14.0–49.0)
MCH: 28.3 pg (ref 27.2–33.4)
MCHC: 30.9 g/dL — AB (ref 32.0–36.0)
MCV: 91.6 fL (ref 79.3–98.0)
MONO#: 1.2 10*3/uL — AB (ref 0.1–0.9)
MONO%: 11.1 % (ref 0.0–14.0)
NEUT#: 7.9 10*3/uL — ABNORMAL HIGH (ref 1.5–6.5)
NEUT%: 75.2 % — ABNORMAL HIGH (ref 39.0–75.0)
NRBC: 0 % (ref 0–0)
Platelets: 389 10*3/uL (ref 140–400)
RBC: 3.46 10*6/uL — AB (ref 4.20–5.82)
RDW: 20.8 % — AB (ref 11.0–14.6)
WBC: 10.5 10*3/uL — ABNORMAL HIGH (ref 4.0–10.3)
lymph#: 0.9 10*3/uL (ref 0.9–3.3)

## 2014-04-17 MED ORDER — SODIUM CHLORIDE 0.9 % IJ SOLN
10.0000 mL | INTRAMUSCULAR | Status: DC | PRN
  Administered 2014-04-17: 10 mL via INTRAVENOUS
  Filled 2014-04-17: qty 10

## 2014-04-17 MED ORDER — MORPHINE SULFATE 30 MG PO TABS: 30.0000 mg | ORAL_TABLET | Freq: Four times a day (QID) | ORAL | Status: AC | PRN

## 2014-04-17 MED ORDER — AMOXICILLIN-POT CLAVULANATE 875-125 MG PO TABS: 1.0000 | ORAL_TABLET | Freq: Two times a day (BID) | ORAL | Status: DC

## 2014-04-17 NOTE — Patient Instructions (Signed)

## 2014-04-17 NOTE — Assessment & Plan Note (Signed)
His pain appeared to be well controlled with current prescription of morphine.  I have refilled his prescription today. We discussed narcotic refill policy. In the future, I will get hospice to renew his prescriptions and manage his pain

## 2014-04-17 NOTE — Progress Notes (Signed)
Patient in for port access.  Port flushed without any difficulties and blood return noted.  Unable to draw enough blood for labs.  Sent back to lab for phlebotomist to draw.

## 2014-04-17 NOTE — Progress Notes (Signed)
[facility] OFFICE PROGRESS NOTE  Patient Care Team: No Pcp Per Patient as PCP - General (General Practice) Heath Lark, MD as Consulting Physician (Hematology and Oncology) Leta Baptist, MD as Consulting Physician (Otolaryngology) Leota Sauers, RN as Oncology Nurse Navigator (Oncology) Eppie Gibson, MD as Attending Physician (Radiation Oncology) Karie Mainland, RD as Dietitian (Nutrition)  SUMMARY OF ONCOLOGIC HISTORY: Oncology History   Oropharyngeal cancer, HPV negative   Primary site: Pharynx - Oropharynx   Staging method: AJCC 7th Edition   Clinical: Stage IVC (T4b, N3, M1) signed by Heath Lark, MD on 07/01/2013  8:32 PM   Summary: Stage IVC (T4b, N3, M1)         Oropharyngeal cancer   05/06/2013 Imaging 4.2 x 4.7 x 6.1 cm heterogeneous right neck mass in the right level 2 & level 3 stations. It invades the carotid space, obstructing the internal jugular vein. The primary lesion may be along the inferior aspect of the right palatine tonsil    05/22/2013 Procedure Laryngoscopy revealed abnormalities beneath the right tonsil area. Unfortunately tonsillectomy and right neck biopsy came back benign tissue.   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   06/24/2013 Surgery He underwent multiple extraction of tooth numbers 2, 3, 6, 8, 9, 11, 12, 13, 14, 15, 19, 20, 22, 23, 24, 25, 26, 27, and 28 along with 4 Quadrants of alveoloplasty   06/28/2013 Imaging PET/CT scan results show widespread pulmonary metastasis.   07/11/2013 - 11/07/2013 Chemotherapy The patient received palliative chemotherapy with 5-FU, carboplatin and weekly Erbitux.   09/09/2013 Imaging Repeat PET CT scan show good response to treatment.   11/14/2013 Imaging Repeat PET/CT showed disease progression   11/26/2013 - 12/18/2013 Radiation Therapy He received palliative radiation therapy to the neck and the back.   01/15/2014 Imaging Repeat PET/CT scan showed improvement in disease  control around his neck but significant progression of pulmonary metastases   01/23/2014 - 03/27/2014 Chemotherapy He received palliative treatment with Taxol weekly   04/16/2014 Imaging Repeat PET CT scan showed disease progression    INTERVAL HISTORY: Please see below for problem oriented charting. He is seen as part of his follow-up prior to chemotherapy. He complained of profound weakness and shortness of breath on minimal exertion. He also had a productive cough. Denies fevers or chills. He has difficulties getting out of bed and spent most of the time resting in bed over the last few weeks.  REVIEW OF SYSTEMS:   Constitutional: Denies fevers, chills or abnormal weight loss Eyes: Denies blurriness of vision Ears, nose, mouth, throat, and face: Denies mucositis or sore throat Cardiovascular: Denies palpitation, chest discomfort or lower extremity swelling Gastrointestinal:  Denies nausea, heartburn or change in bowel habits Skin: Denies abnormal skin rashes Lymphatics: Denies new lymphadenopathy or easy bruising Neurological:Denies numbness, tingling Behavioral/Psych: Mood is stable, no new changes  All other systems were reviewed with the patient and are negative.  I have reviewed the past medical history, past surgical history, social history and family history with the patient and they are unchanged from previous note.  ALLERGIES:  has No Known Allergies.  MEDICATIONS:  Current Outpatient Prescriptions  Medication Sig Dispense Refill  . amoxicillin-clavulanate (AUGMENTIN) 875-125 MG per tablet Take 1 tablet by mouth 2 (two) times daily. 14 tablet 0  . Diphenhyd-Lidocaine-Nystatin (FIRST-BXN MOUTHWASH) SUSP Take 5 mLs by mouth 4 (four) times daily. Verbal order from Dr. Alvy Bimler for these ingredients plus Maalox in a 1:1:1:1 concentration.  Called to [facility] for Quantity Sufficient. 1 Bottle 2  . docusate sodium (COLACE) 100 MG capsule Take 1 capsule (100 mg total) by  mouth 2 (two) times daily. 60 capsule 0  . lidocaine-prilocaine (EMLA) cream Apply 1 application topically as needed. 30 g 6  . morphine (MSIR) 30 MG tablet Take 1 tablet (30 mg total) by mouth every 6 (six) hours as needed for severe pain. 90 tablet 0  . Ondansetron HCl (ZOFRAN PO) Take by mouth as needed.    . Prochlorperazine Maleate (COMPAZINE PO) Take by mouth as needed.     No current facility-administered medications for this visit.    PHYSICAL EXAMINATION: ECOG PERFORMANCE STATUS: 3 - Symptomatic, >50% confined to bed  Filed Vitals:   04/17/14 1021  BP: 119/63  Pulse: 84  Temp: 98.5 F (36.9 C)  Resp: 18   Filed Weights   04/17/14 1021  Weight: 110 lb 8 oz (50.122 kg)    GENERAL:alert, no distress and comfortable. He looks thin and cachectic SKIN: skin color, texture, turgor are normal, no rashes or significant lesions EYES: normal, Conjunctiva are pink and non-injected, sclera clear OROPHARYNX:no exudate, no erythema and lips, buccal mucosa, and tongue normal  NECK: Persistent mass in the right side of the neck.  LYMPH:  no palpable lymphadenopathy in the cervical, axillary or inguinal LUNGS: Reduced breath sounds in the left lung base. HEART: regular rate & rhythm and no murmurs and no lower extremity edema ABDOMEN:abdomen soft, non-tender and normal bowel sounds Musculoskeletal:no cyanosis of digits and no clubbing  NEURO: alert & oriented x 3 with fluent speech, no focal motor/sensory deficits  LABORATORY DATA:  I have reviewed the data as listed    Component Value Date/Time   NA 136 04/17/2014 0920   K 4.5 04/17/2014 0920   CO2 26 04/17/2014 0920   GLUCOSE 113 04/17/2014 0920   BUN 9.5 04/17/2014 0920   CREATININE 0.6* 04/17/2014 0920   CALCIUM 8.9 04/17/2014 0920   PROT 7.0 04/17/2014 0920   ALBUMIN 2.4* 04/17/2014 0920   AST 24 04/17/2014 0920   ALT 26 04/17/2014 0920   ALKPHOS 56 04/17/2014 0920   BILITOT 0.21 04/17/2014 0920    No results found  for: SPEP, UPEP  Lab Results  Component Value Date   WBC 10.5* 04/17/2014   NEUTROABS 7.9* 04/17/2014   HGB 9.8* 04/17/2014   HCT 31.7* 04/17/2014   MCV 91.6 04/17/2014   PLT 389 04/17/2014      Chemistry      Component Value Date/Time   NA 136 04/17/2014 0920   K 4.5 04/17/2014 0920   CO2 26 04/17/2014 0920   BUN 9.5 04/17/2014 0920   CREATININE 0.6* 04/17/2014 0920      Component Value Date/Time   CALCIUM 8.9 04/17/2014 0920   ALKPHOS 56 04/17/2014 0920   AST 24 04/17/2014 0920   ALT 26 04/17/2014 0920   BILITOT 0.21 04/17/2014 0920       RADIOGRAPHIC STUDIES: I have personally reviewed the radiological images as listed and agreed with the findings in the report. Nm Pet Image Restag (ps) Skull Base To Thigh  04/16/2014   CLINICAL DATA:  Subsequent treatment strategy for metastatic oral pharyngeal carcinoma.  EXAM: NUCLEAR MEDICINE PET SKULL BASE TO THIGH  TECHNIQUE: 5.8 mCi F-18 FDG was injected intravenously. Full-ring PET imaging was performed from the skull base to thigh after the radiotracer. CT data was obtained and used for attenuation correction and anatomic localization.  FASTING BLOOD GLUCOSE:  Value: 122 mg/dl  COMPARISON:  Multiple prior PET CTs.  The most recent is 01/15/2014  FINDINGS: NECK  The ill-defined infiltrating soft tissue mass in the right neck measures approximately 5.7 x 4.2 cm and previously measured 6.4 x 5.8 cm persistent low level FDG activity with SUV max of 4.5 (previously 4.1). Along the inferior margin of the mass anteriorly there is a new more focal area of hypermetabolism with SUV max of 5.75. This is likely a hypermetabolic lymph node. Persistent FDG uptake in the region of the left true vocal cord may be due to paralysis of the right vocal cord.  CHEST  Diffuse widespread pulmonary metastatic disease is again demonstrated.  Right lower lobe pulmonary nodule on image number 76 measures 31 x 25 mm. It previously measured 22 x 17 mm. SUV max is  6.7 and was previously 4.0.  Right middle lobe lesion measures 22 x 14 mm and previously measured 15.5 x 11.5 mm SUV max is 9.5 and was previously 7.5.  There is dense confluent airspace opacity in the left lower lobe which may be related to radiation, confluent tumor or superimposed pneumonia.  Left lower lobe lesion adjacent to the major fissure on image 68 measures 16 x 12 mm and previously measured 17 x 12 mm. SUV max is 7.7 and was previously 8.8.  Two adjacent peripheral lesions in the left upper lobe on image number 59 have decreased in size and the more anterior lesion appears necrotic. SUV max has also decreased.  Other nodules are noted to be smaller. A lesion near the ascending aorta in the right upper lobe measures 15.5 x 10 mm and previously measured 21 x 14 mm. SUV max is 5.2 and was previously 5.8.  There is also new sub carinal adenopathy measuring approximately 20 x 12.5 mm with SUV max of 5.7  Severe underlying emphysema is again demonstrated.  ABDOMEN/PELVIS  No abnormal hypermetabolic activity within the liver, pancreas, adrenal glands, or spleen. No hypermetabolic lymph nodes in the abdomen or pelvis.  SKELETON  No focal hypermetabolic activity to suggest skeletal metastasis.  IMPRESSION: 1. Slight interval decrease in size of the right neck mass and slight decrease and FDG uptake. However, there is a new adjacent hypermetabolic lymph node. 2. Mixed findings in the chest. Most of the lesions are smaller and demonstrate decreased FDG uptake. Some are larger and show more metabolic activity. There is also more diffuse ill-defined airspace disease in the left lower lobe which could be a combination of radiation change and/or pneumonia and confluent disease. 3. New hypermetabolic subcarinal adenopathy. 4. No new abdominal metastatic disease or osseous metastatic disease.   Electronically Signed   By: Marijo Sanes M.D.   On: 04/16/2014 10:31     ASSESSMENT & PLAN:  Oropharyngeal  cancer Unfortunately, the patient has significant disease progression. He is getting weaker. I told him, he has progressed on 4 different lines of treatment. I recommend palliative care and hospice and he agree. I will discontinue treatment and I referred him to hospice.   Anemia in neoplastic disease This is likely due to recent treatment. The patient denies recent history of bleeding such as epistaxis, hematuria or hematochezia. He is asymptomatic from the anemia. I will observe for now.  He does not require transfusion now.   Throat pain His pain appeared to be well controlled with current prescription of morphine.  I have refilled his prescription today. We discussed narcotic refill policy. In the  future, I will get hospice to renew his prescriptions and manage his pain       Left lower lobe pneumonia He has signs of left lower lobe pneumonia with oxygen desaturation and leukocytosis. I suspect he may have aspiration pneumonia. He had appointment to see spatial language therapist's assessment next week and I recommend he keeps that appointment. I will treat him with a course of antibiotics. I will refer him to hospice and hopefully they can bring him oxygen at home   Hypoxemia He has evidence of oxygen desaturation with mobility. His PET CT scan showed left lower lobe pneumonia. I will treat him with a course of antibiotics and recommended hospice to bring oxygen to his house.    No orders of the defined types were placed in this encounter.   All questions were answered. The patient knows to call the clinic with any problems, questions or concerns. No barriers to learning was detected. I spent 25 minutes counseling the patient face to face. The total time spent in the appointment was 30 minutes and more than 50% was on counseling and review of test results     [facility], [place], MD 04/17/2014 10:49 AM

## 2014-04-17 NOTE — Assessment & Plan Note (Signed)
He has signs of left lower lobe pneumonia with oxygen desaturation and leukocytosis. I suspect he may have aspiration pneumonia. He had appointment to see spatial language therapist's assessment next week and I recommend he keeps that appointment. I will treat him with a course of antibiotics. I will refer him to hospice and hopefully they can bring him oxygen at home

## 2014-04-17 NOTE — Assessment & Plan Note (Signed)
This is likely due to recent treatment. The patient denies recent history of bleeding such as epistaxis, hematuria or hematochezia. He is asymptomatic from the anemia. I will observe for now.  He does not require transfusion now. 

## 2014-04-17 NOTE — Assessment & Plan Note (Signed)
He has evidence of oxygen desaturation with mobility. His PET CT scan showed left lower lobe pneumonia. I will treat him with a course of antibiotics and recommended hospice to bring oxygen to his house.

## 2014-04-17 NOTE — Telephone Encounter (Signed)
Referral made to [facility] for home care services.  S/w V[facility]. They will contact pt today to arrange visit.

## 2014-04-17 NOTE — Assessment & Plan Note (Signed)
Unfortunately, the patient has significant disease progression. He is getting weaker. I told him, he has progressed on 4 different lines of treatment. I recommend palliative care and hospice and he agree. I will discontinue treatment and I referred him to hospice.

## 2014-04-22 ENCOUNTER — Telehealth: Payer: Self-pay | Admitting: *Deleted

## 2014-04-22 ENCOUNTER — Ambulatory Visit: Payer: BLUE CROSS/BLUE SHIELD

## 2014-04-22 NOTE — Telephone Encounter (Signed)
Notified Hospice that Dr Alvy Bimler is OK with DNR and home oxygen

## 2014-04-22 NOTE — Telephone Encounter (Signed)
Hospice called to see if it is OK with Dr Alvy Bimler to write DNR for patient and 02 2 liters prn SOB? They will arrange delivery of 02. (routed to Dr Alvy Bimler)

## 2014-04-24 ENCOUNTER — Ambulatory Visit: Payer: Self-pay

## 2014-04-24 ENCOUNTER — Other Ambulatory Visit: Payer: Self-pay

## 2014-04-28 ENCOUNTER — Ambulatory Visit: Payer: BLUE CROSS/BLUE SHIELD | Attending: Radiation Oncology

## 2014-04-28 DIAGNOSIS — R1310 Dysphagia, unspecified: Secondary | ICD-10-CM | POA: Insufficient documentation

## 2014-04-28 NOTE — Therapy (Addendum)
[facility] [address] [place], [place], [address] Phone: [phone]   Fax:  [phone]  Speech Language Pathology Treatment  Patient Details  Name: Derek Morgan MRN: [id number] Date of Birth: July 13, 1955 Referring Provider:  Eppie Gibson, MD  Encounter Date: 04/28/2014      End of Session - 04/28/14 0934    Visit Number 7   Number of Visits 9   Date for SLP Re-Evaluation 06/25/14   SLP Start Time 0847   SLP Stop Time  0929   SLP Time Calculation (min) 42 min   Activity Tolerance Patient tolerated treatment well      Past Medical History  Diagnosis Date  . Heavy smoker   . Hoarseness of voice   . Mass in neck 04/2013    right/ Metastatic Squamous Cell Carcinoma  . Loose, teeth 06/24/2013  . Weight loss 06/17/2013  . Throat pain 06/17/2013  . Shortness of breath     with exertion at times  . Cancer     oral, pharyngeal  . History of radiation therapy 11/26/13- 12/18/13    right neck mass/C5 lesion 37.5 Gy 15 fractions    Past Surgical History  Procedure Laterality Date  . Broken leg  1998    orif ankle-left-plated  . Direct laryngoscopy N/A 05/22/2013    Procedure: DIRECT LARYNGOSCOPY;  Surgeon: Ascencion Dike, MD;  Location: [facility] OR;  Service: ENT;  Laterality: N/A;  . Tonsillectomy Right 05/22/2013    Procedure: RIGHT TONSILLECTOMY;  Surgeon: Ascencion Dike, MD;  Location: [facility];  Service: ENT;  Laterality: Right;  . Mass biopsy Right 05/22/2013    Procedure:  INCISIONAL BIOPSY NECK MASS;  Surgeon: Ascencion Dike, MD;  Location: [facility];  Service: ENT;  Laterality: Right;  . Mass biopsy Right 06/07/2013    Procedure: EXCISIONAL BIOPSY OF RIGHT NECK MASS;  Surgeon: Ascencion Dike, MD;  Location: [facility];  Service: ENT;  Laterality: Right;  . Multiple extractions with alveoloplasty N/A 06/24/2013    Procedure: Extraction of tooth #'s 2,3,6,8,9,11,12,13,14,15,19,20,22,23,24,25,26,27,28 with alveoloplasty.;  Surgeon: Lenn Cal,  DDS;  Location: [facility];  Service: Oral Surgery;  Laterality: N/A;  . Portacath placement Left 07/04/2013    Procedure: INSERTION PORT-A-CATH;  Surgeon: Merrie Roof, MD;  Location: [facility];  Service: General;  Laterality: Left;    There were no vitals filed for this visit.  Visit Diagnosis: No diagnosis found.      Subjective Assessment - 04/28/14 0859    Subjective "My doctor put me on hospice, but said to keep your appointments." Hospice RN last Tuesday.                ADULT SLP TREATMENT - 04/28/14 0851    General Information   Behavior/Cognition Cooperative;Alert;Pleasant mood   Treatment Provided   Treatment provided Dysphagia   Dysphagia Treatment   Oral Cavity - Dentition Edentulous   Treatment Methods Skilled observation;Therapeutic exercise   Patient observed directly with PO's Yes   Type of PO's observed Dysphagia 3 (soft)   Liquids provided via Cup   Oral Phase Signs & Symptoms --  no overt oral signs of aspiration   Pharyngeal Phase Signs & Symptoms Immediate cough  on 1/10 swallows   Type of cueing Verbal   Amount of cueing --  rare min verbal cues   Other treatment/comments Pt with evidence of LLL approx 3 weeks ago, assumed to be caused by aspiration. SLP educated pt  re modified barium swallow exam (MBSS), and pt told SLP he would like to "wait it out" until next session with SLP, next month before deciding if he would like to pursue that. Pt assured SLP he is completing HEP. Maximal fibrosis on rt side under the mandible, most significant near the angle of the ramus. Fatigue noted after 3 swallows. Pt needed to read directions for 3 exercises, SLP needed to cue pt for number of seconds hold consistently.    Pain Assessment   Pain Assessment 0-10   Pain Score 4    Pain Location rt neck   Pain Descriptors / Indicators Sore   Pain Intervention(s) Monitored during session   Dysphagia Recommendations   Diet recommendations Dysphagia 3 (mechanical soft);Thin  liquid   Liquids provided via Cup   Compensations Small sips/bites;Slow rate  suspect pt may not be using small bites/sips at home          SLP Education - 04/28/14 0934    Education provided Yes   Education Details HEP, modified barium swallow exam, swallow/aspiration precautions    Person(s) Educated Patient   Methods Explanation   Comprehension Verbalized understanding          SLP Short Term Goals - 04/28/14 1102    SLP SHORT TERM GOAL #1   Title pt will complete HEP with rare min A (03-25-14)   Time 1   Period --  visits   Status On-going  Extended    SLP SHORT TERM GOAL #2   Title pt will tell SLP why he is completing HEP    Status Achieved  08-26-13          SLP Long Term Goals - 04/28/14 1103    SLP LONG TERM GOAL #1   Title copmlete HEP with modified independence (05-25-14)   Time 2   Period --  visits   Status On-going  extended   SLP LONG TERM GOAL #2   Title tell SLP three s/s aspiration PNA (05-25-14)   Time 2   Period --  visits   Status On-going  extended   SLP LONG TERM GOAL #3   Title tell SLP why a food journal could benefit pt during return to PO diet after head/neck rad (05-25-14)   Status Deferred  due to pt eating desired POs          Plan - 04/28/14 0935    Clinical Impression Statement SLP questions whether pt completing entire HEP, or completing it at frequency suggested. Pt now on hospice.   Speech Therapy Frequency --  monthly   Duration --  1-2 more visits   Treatment/Interventions Aspiration precaution training;Pharyngeal strengthening exercises;Oral motor exercises;SLP instruction and feedback;Diet toleration management by SLP   Potential to Achieve Goals Fair   Potential Considerations Cooperation/participation level;Severity of impairments     SPEECH THERAPY DISCHARGE SUMMARY  Visits from Start of Care: 7  Current functional level related to goals / functional outcomes: Pt was not consistent at completing HEP.  However, was eating fairly well, given his situation re: prognosis. Pt did not return to therapy after this visit.   Remaining deficits: Minimal dysphagia suspected   Education / Equipment: HEP, mucositis/esophagitis, xerostomia.  Plan: Patient agrees to discharge.  Patient goals were partially met. Patient is being discharged due to not returning since the last visit.  ?????        Problem List Patient Active Problem List   Diagnosis Date Noted  . Left lower lobe pneumonia  04/17/2014  . Hypoxemia 04/17/2014  . DNR (do not resuscitate) discussion 12/23/2013  . Weakness generalized 12/23/2013  . Mucositis due to antineoplastic therapy 12/05/2013  . 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 Leukopenia due to antineoplastic chemotherapy 08/22/2013  . Thrombocytopenia due to drugs 08/22/2013  . Skin rash 08/01/2013  . Dental caries 06/24/2013  . Weight loss 06/17/2013  . 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 Tobacco abuse 06/17/2013  . Throat pain 06/17/2013    [facility], SLP 04/28/2014, 11:03 AM  [facility] [address] [place], [place], [address] Phone: [phone]   Fax:  [phone]

## 2014-04-28 NOTE — Patient Instructions (Addendum)
Do the exercises 2-3 times per day to hopefully loosen up some of that hardening you have on your right side of the neck. It is likely making swallowing more difficult for you. Think about whether you want to have the modified barium swallow test. We can talk about this next month. It will help you see what kinds of foods/liquids you are safe with. Make sure you are taking small bites and sips when you eat and drink. It will take you longer to eat but you will likely remain safer - less risk of aspiration.

## 2014-05-01 ENCOUNTER — Other Ambulatory Visit: Payer: Self-pay

## 2014-05-01 ENCOUNTER — Ambulatory Visit: Payer: Self-pay

## 2014-05-01 ENCOUNTER — Telehealth: Payer: Self-pay | Admitting: *Deleted

## 2014-05-01 NOTE — Telephone Encounter (Signed)
TC from Sabino Dick, RN with [facility]. Her question was about recently prescribed antibiotic and whether it needs to continue or not. Reviewed Dr. Calton Dach notes. Antibiotic was for 1 week only, not to be refilled at this time. Also clarified with Sharyn Lull where pt was receiving his speech therapy. No further concerns at this time.

## 2014-05-19 ENCOUNTER — Ambulatory Visit: Payer: BLUE CROSS/BLUE SHIELD

## 2014-06-05 ENCOUNTER — Other Ambulatory Visit: Payer: Self-pay | Admitting: *Deleted

## 2014-06-05 ENCOUNTER — Telehealth: Payer: Self-pay | Admitting: *Deleted

## 2014-06-05 MED ORDER — PREDNISONE 20 MG PO TABS: 40.0000 mg | ORAL_TABLET | Freq: Every day | ORAL | Status: DC

## 2014-06-05 NOTE — Telephone Encounter (Signed)
Informed Hospice RN, Sharyn Lull, of new Rx for Prednisone 40 mg daily x 10 days sent to pt's pharmacy per Dr. Alvy Bimler.

## 2014-06-05 NOTE — Telephone Encounter (Signed)
I would recommend try prednisone 40 mg daily X 10 days and call back Please call in prescription

## 2014-06-05 NOTE — Telephone Encounter (Signed)
PT.'S COUGHING HAS INCREASED. LUNG SOUNDS ON THE RIGHT ARE COARSE AND THERE IS WHEEZING. HE IS UNABLE TO COUGH SECRETIONS OUT SO UNSURE IF THERE IS ANY COLOR . NO FEVER. PT. HAS A NEBULIZER AT HOME BUT NO MEDICATIONS. MICHELLE ALSO REQUESTED A MEDICATION TO HELP THIN SECRETIONS. PT. IS STILL WORKING.

## 2014-07-23 ENCOUNTER — Telehealth: Payer: Self-pay | Admitting: *Deleted

## 2014-07-23 NOTE — Telephone Encounter (Signed)
Left a message for " Deep"  at [employer] with date of last chemo. To call back if has questions

## 2014-07-25 ENCOUNTER — Telehealth: Payer: Self-pay | Admitting: *Deleted

## 2014-07-25 NOTE — Telephone Encounter (Signed)
Spoke to representative from [employer] regarding disability for pt.  Informed them that pt is currently on [facility]), contact number for hospice given.

## 2014-08-13 ENCOUNTER — Encounter: Payer: Self-pay | Admitting: Hematology and Oncology

## 2014-08-13 NOTE — Progress Notes (Signed)
I placed forms on desk of dr. Alvy Bimler form hospice. They were filled out by [facility]-primary RN with [facility]. Notes/labs from last visit are attached from [facility]-DR. Alvy Bimler

## 2014-08-14 ENCOUNTER — Encounter: Payer: Self-pay | Admitting: Hematology and Oncology

## 2014-08-14 NOTE — Progress Notes (Signed)
I placed forms for pick at front desk with ms. Sierra Brooks for hospice nurse.

## 2014-08-19 ENCOUNTER — Observation Stay (HOSPITAL_COMMUNITY)
Admission: AD | Admit: 2014-08-19 | Discharge: 2014-08-20 | Disposition: A | Discharge status: Home / Self Care | Payer: BLUE CROSS/BLUE SHIELD | Source: Ambulatory Visit | Attending: Hematology and Oncology | Admitting: Hematology and Oncology

## 2014-08-19 ENCOUNTER — Encounter (HOSPITAL_COMMUNITY): Payer: Self-pay

## 2014-08-19 ENCOUNTER — Telehealth: Payer: Self-pay | Admitting: *Deleted

## 2014-08-19 DIAGNOSIS — Z515 Encounter for palliative care: Secondary | ICD-10-CM | POA: Insufficient documentation

## 2014-08-19 DIAGNOSIS — D630 Anemia in neoplastic disease: Secondary | ICD-10-CM

## 2014-08-19 DIAGNOSIS — C7802 Secondary malignant neoplasm of left lung: Secondary | ICD-10-CM

## 2014-08-19 DIAGNOSIS — R627 Adult failure to thrive: Secondary | ICD-10-CM | POA: Diagnosis not present

## 2014-08-19 DIAGNOSIS — R070 Pain in throat: Secondary | ICD-10-CM | POA: Diagnosis present

## 2014-08-19 DIAGNOSIS — Z79899 Other long term (current) drug therapy: Secondary | ICD-10-CM | POA: Diagnosis not present

## 2014-08-19 DIAGNOSIS — Z923 Personal history of irradiation: Secondary | ICD-10-CM | POA: Insufficient documentation

## 2014-08-19 DIAGNOSIS — C7801 Secondary malignant neoplasm of right lung: Secondary | ICD-10-CM

## 2014-08-19 DIAGNOSIS — R634 Abnormal weight loss: Secondary | ICD-10-CM | POA: Diagnosis present

## 2014-08-19 DIAGNOSIS — C109 Malignant neoplasm of oropharynx, unspecified: Secondary | ICD-10-CM | POA: Diagnosis present

## 2014-08-19 DIAGNOSIS — R1310 Dysphagia, unspecified: Secondary | ICD-10-CM | POA: Diagnosis present

## 2014-08-19 DIAGNOSIS — Z8589 Personal history of malignant neoplasm of other organs and systems: Secondary | ICD-10-CM | POA: Insufficient documentation

## 2014-08-19 DIAGNOSIS — G893 Neoplasm related pain (acute) (chronic): Secondary | ICD-10-CM

## 2014-08-19 DIAGNOSIS — R1011 Right upper quadrant pain: Secondary | ICD-10-CM | POA: Insufficient documentation

## 2014-08-19 DIAGNOSIS — C099 Malignant neoplasm of tonsil, unspecified: Secondary | ICD-10-CM | POA: Diagnosis present

## 2014-08-19 DIAGNOSIS — E46 Unspecified protein-calorie malnutrition: Secondary | ICD-10-CM

## 2014-08-19 DIAGNOSIS — Z87891 Personal history of nicotine dependence: Secondary | ICD-10-CM | POA: Diagnosis not present

## 2014-08-19 MED ORDER — ACETAMINOPHEN 650 MG RE SUPP: 650.0000 mg | Freq: Four times a day (QID) | RECTAL | Status: DC | PRN

## 2014-08-19 MED ORDER — LIDOCAINE-PRILOCAINE 2.5-2.5 % EX CREA
TOPICAL_CREAM | Freq: Once | CUTANEOUS | Status: AC
  Administered 2014-08-19: 15:00:00 via TOPICAL
  Filled 2014-08-19: qty 5

## 2014-08-19 MED ORDER — MORPHINE SULFATE 4 MG/ML IJ SOLN
4.0000 mg | INTRAMUSCULAR | Status: DC | PRN
  Administered 2014-08-19 (×2): 4 mg via INTRAVENOUS
  Filled 2014-08-19 (×2): qty 1

## 2014-08-19 MED ORDER — LORAZEPAM 2 MG/ML IJ SOLN: 1.0000 mg | INTRAMUSCULAR | Status: DC | PRN

## 2014-08-19 MED ORDER — HALOPERIDOL 0.5 MG PO TABS
0.5000 mg | ORAL_TABLET | ORAL | Status: DC | PRN
  Filled 2014-08-19: qty 1

## 2014-08-19 MED ORDER — DIPHENHYDRAMINE HCL 50 MG/ML IJ SOLN: 12.5000 mg | INTRAMUSCULAR | Status: DC | PRN

## 2014-08-19 MED ORDER — ACETAMINOPHEN 325 MG PO TABS: 650.0000 mg | ORAL_TABLET | ORAL | Status: DC | PRN

## 2014-08-19 MED ORDER — MORPHINE SULFATE 30 MG PO TABS: 30.0000 mg | ORAL_TABLET | Freq: Four times a day (QID) | ORAL | Status: DC | PRN

## 2014-08-19 MED ORDER — LORAZEPAM 2 MG/ML PO CONC: 1.0000 mg | ORAL | Status: DC | PRN

## 2014-08-19 MED ORDER — ONDANSETRON HCL 4 MG PO TABS: 4.0000 mg | ORAL_TABLET | Freq: Three times a day (TID) | ORAL | Status: DC | PRN

## 2014-08-19 MED ORDER — SODIUM CHLORIDE 0.9 % IV SOLN
INTRAVENOUS | Status: DC
  Administered 2014-08-19: 16:00:00 via INTRAVENOUS

## 2014-08-19 MED ORDER — GUAIFENESIN-DM 100-10 MG/5ML PO SYRP: 10.0000 mL | ORAL_SOLUTION | ORAL | Status: DC | PRN

## 2014-08-19 MED ORDER — HALOPERIDOL LACTATE 5 MG/ML IJ SOLN: 0.5000 mg | INTRAMUSCULAR | Status: DC | PRN

## 2014-08-19 MED ORDER — HALOPERIDOL LACTATE 2 MG/ML PO CONC
0.5000 mg | ORAL | Status: DC | PRN
  Filled 2014-08-19: qty 0.3

## 2014-08-19 MED ORDER — SENNOSIDES-DOCUSATE SODIUM 8.6-50 MG PO TABS: 1.0000 | ORAL_TABLET | Freq: Two times a day (BID) | ORAL | Status: DC

## 2014-08-19 MED ORDER — GLYCOPYRROLATE 0.2 MG/ML IJ SOLN
0.2000 mg | INTRAMUSCULAR | Status: DC | PRN
Start: 2014-08-19 — End: 2014-08-20
  Filled 2014-08-19: qty 1

## 2014-08-19 MED ORDER — SENNOSIDES-DOCUSATE SODIUM 8.6-50 MG PO TABS
1.0000 | ORAL_TABLET | Freq: Two times a day (BID) | ORAL | Status: DC
  Filled 2014-08-19 (×3): qty 1

## 2014-08-19 MED ORDER — BIOTENE DRY MOUTH MT LIQD: 15.0000 mL | OROMUCOSAL | Status: DC | PRN

## 2014-08-19 MED ORDER — MORPHINE SULFATE 2 MG/ML IJ SOLN
2.0000 mg | Freq: Four times a day (QID) | INTRAMUSCULAR | Status: DC
  Administered 2014-08-19 – 2014-08-20 (×4): 2 mg via INTRAVENOUS
  Filled 2014-08-19 (×4): qty 1

## 2014-08-19 MED ORDER — FIRST-BXN MOUTHWASH MT SUSP: 5.0000 mL | Freq: Four times a day (QID) | OROMUCOSAL | Status: DC

## 2014-08-19 MED ORDER — ACETAMINOPHEN 325 MG PO TABS: 650.0000 mg | ORAL_TABLET | Freq: Four times a day (QID) | ORAL | Status: DC | PRN

## 2014-08-19 MED ORDER — ENOXAPARIN SODIUM 40 MG/0.4ML ~~LOC~~ SOLN
40.0000 mg | SUBCUTANEOUS | Status: DC
  Filled 2014-08-19: qty 0.4

## 2014-08-19 MED ORDER — MAGIC MOUTHWASH
5.0000 mL | Freq: Four times a day (QID) | ORAL | Status: DC
  Administered 2014-08-19 – 2014-08-20 (×3): 5 mL via ORAL
  Filled 2014-08-19 (×7): qty 5

## 2014-08-19 MED ORDER — SODIUM CHLORIDE 0.9 % IV SOLN
8.0000 mg | Freq: Three times a day (TID) | INTRAVENOUS | Status: DC | PRN
  Filled 2014-08-19: qty 4

## 2014-08-19 MED ORDER — ONDANSETRON HCL 4 MG/2ML IJ SOLN
4.0000 mg | Freq: Three times a day (TID) | INTRAMUSCULAR | Status: DC | PRN
  Administered 2014-08-20: 4 mg via INTRAVENOUS
  Filled 2014-08-19: qty 2

## 2014-08-19 MED ORDER — TRAZODONE HCL 50 MG PO TABS: 25.0000 mg | ORAL_TABLET | Freq: Every evening | ORAL | Status: DC | PRN

## 2014-08-19 MED ORDER — ONDANSETRON 4 MG PO TBDP: 4.0000 mg | ORAL_TABLET | Freq: Three times a day (TID) | ORAL | Status: DC | PRN

## 2014-08-19 MED ORDER — ALUM & MAG HYDROXIDE-SIMETH 200-200-20 MG/5ML PO SUSP: 60.0000 mL | ORAL | Status: DC | PRN

## 2014-08-19 MED ORDER — DOCUSATE SODIUM 100 MG PO CAPS: 100.0000 mg | ORAL_CAPSULE | Freq: Two times a day (BID) | ORAL | Status: DC

## 2014-08-19 MED ORDER — HYDROCORTISONE 2.5 % RE CREA: 1.0000 "application " | TOPICAL_CREAM | Freq: Two times a day (BID) | RECTAL | Status: DC | PRN

## 2014-08-19 MED ORDER — GLYCOPYRROLATE 1 MG PO TABS
1.0000 mg | ORAL_TABLET | ORAL | Status: DC | PRN
  Filled 2014-08-19: qty 1

## 2014-08-19 MED ORDER — ALBUTEROL SULFATE (2.5 MG/3ML) 0.083% IN NEBU: 2.5000 mg | INHALATION_SOLUTION | RESPIRATORY_TRACT | Status: DC | PRN

## 2014-08-19 MED ORDER — LORAZEPAM 1 MG PO TABS: 1.0000 mg | ORAL_TABLET | ORAL | Status: DC | PRN

## 2014-08-19 NOTE — Consult Note (Signed)
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 male with a history of oropharyngeal cancer currently enrolled in [place], admitted on 8/2 for end of life care, in abscence of beds at [facility] at this time. He is very cachectic, although able to interact. Denies fevers, chills, night sweats, or vision changes. He reports swallowing difficulties. Denies any respiratory complaints. Denies any chest pain or palpitations. Denies lower extremity swelling. Denies nausea, heartburn or change in bowel habits. Denies abdominal pain. He has failure to thrive. Denies any dysuria reports decreased urine output. Denies abnormal skin rashes, or neuropathy. Denies any bleeding issues such as epistaxis, hematemesis, hematuria or hematochezia.   Palliative care consultation has been requested for appropriate end-of-life orders and appropriate symptom management at end-of-life and for coordination of care with hospice services.  Patient is an extremity cachectic older than stated age appearing gentleman. He is not able to verbalize but does make his wishes known by writing on a white board. Patient points to an complaints about abdominal discomfort in the epigastric and right upper quadrant area. Discussed with patient. Orders reviewed. Will go ahead and schedule low-dose morphine IV and also continue with when necessary's. Appreciate hospice follow-up. Transfer to [facility] in bed available. Will discontinue maintenance IV fluids at this point. Goal is  comfort.  Contacts/Participants in Discussion: Primary Decision Maker:   Neighbor Jamed Wyrick [phone]  HCPOA: no    Code Status/Advance Care Planning:  DO NOT RESUSCITATE DO NOT INTUBATE comfort measures only  Symptom Management:   Will add low-dose scheduled morphine. Will expanded bowel regimen. Agree with the other end-of-life care orders placed.  Palliative Prophylaxis: Yes  Additional Recommendations (Limitations, Scope, Preferences):  Consider transfer to inpatient hospice when bed available Psycho-social/Spiritual:   Support System: Poor  Desire for further Chaplaincy support:no  Prognosis: Possibly few days  Discharge Planning:  Hospice facility    Values: Comfort measures only at end of life Life limiting illness: Oral pharyngeal cancer      Chief Complaint/History of Present Illness: Generalized pain, failure to thrive  Primary Diagnoses  Present on Admission:  . Oropharyngeal cancer . Weight loss . Throat pain . Tonsil cancer  Palliative Review of Systems: Completed I have reviewed the medical record, interviewed the patient and family, and examined the patient. The following aspects are pertinent.  Past Medical History  Diagnosis Date  . Heavy smoker   . Hoarseness of voice   . Mass in neck 04/2013    right/ Metastatic Squamous Cell Carcinoma  . Loose, teeth 06/24/2013  . Weight loss 06/17/2013  . Throat pain 06/17/2013  . Shortness of breath     with exertion at times  . Cancer     oral, pharyngeal  . History of radiation therapy 11/26/13- 12/18/13    right neck mass/C5 lesion 37.5 Gy 15 fractions   History   Social History  . Marital Status: Single    Spouse Name: N/A  . Number of Children: 0  . Years of Education: N/A   Occupational History  .  Financial planner    Social History Main Topics  . Smoking status: Former Smoker -- 0.25 packs/day for 40 years    Types: Cigarettes  . Smokeless tobacco: Never Used     Comment:  uses e cigarettes 11/18/13/ E-cigarettes  . Alcohol Use: 3.6 oz/week    6 Cans of beer per week     Comment: occl, 11/18/13 quit  . Drug Use: No  . Sexual Activity: No   Other Topics Concern  . None   Social History Narrative   Family History  Problem Relation Age of Onset  . CVA Mother   . CAD Father   . Cancer Father     lung ca   Scheduled Meds: . magic mouthwash  5 mL Oral QID  .  morphine injection  2 mg Intravenous Q6H  . senna-docusate  1 tablet Oral BID  . senna-docusate  1 tablet Oral BID   Continuous Infusions:  PRN Meds:.acetaminophen **OR** acetaminophen, acetaminophen, albuterol, alum & mag hydroxide-simeth, antiseptic oral rinse, diphenhydrAMINE, glycopyrrolate **OR** glycopyrrolate **OR** glycopyrrolate, guaiFENesin-dextromethorphan, haloperidol **OR** haloperidol **OR** haloperidol lactate, hydrocortisone, LORazepam **OR** LORazepam **OR** LORazepam, morphine, morphine injection, ondansetron **OR** ondansetron **OR** ondansetron (ZOFRAN) IV **OR** ondansetron (ZOFRAN) IV, traZODone Medications Prior to Admission:  Prior to Admission medications   Medication Sig Start Date End Date Taking? Authorizing Provider  albuterol (PROVENTIL) (2.5 MG/3ML) 0.083% nebulizer solution Take 2.5 mg by nebulization every 6 (six) hours as needed for wheezing or shortness of breath.   Yes Historical Provider, MD  camphor-menthol [facility]) lotion Apply 1 application topically 4 (four) times daily as needed for itching (face/neck for dry itch skin).   Yes Historical Provider, MD  Diphenhyd-Lidocaine-Nystatin (FIRST-BXN MOUTHWASH) SUSP Take 5 mLs by mouth 4 (four) times daily. Verbal order from Dr. Alvy Bimler for these ingredients plus Maalox in a 1:1:1:1 concentration.  Called to [employer] for Quantity Sufficient. 12/18/13  Yes Heath Lark, MD  docusate sodium (COLACE) 100 MG capsule Take 1 capsule (100 mg total) by mouth 2 (two) times daily. 06/17/13  Yes Heath Lark, MD  guaiFENesin  (ROBITUSSIN) 100 MG/5ML liquid Take 200 mg by mouth 4 (four) times daily as needed for cough (mouth irritation.).   Yes Historical Provider, MD  lidocaine-prilocaine (EMLA) cream Apply 1 application topically as needed. Patient taking differently: Apply 1 application topically as needed port access. 02/20/14  Yes Heath Lark, MD  morphine (MSIR) 30 MG tablet Take 1 tablet (30 mg total) by mouth every 6 (six) hours as needed for severe pain. 04/17/14  Yes Heath Lark, MD  NON FORMULARY Venous Access Device: flush Implanted Vascular Access Device flush every six weeks with normal saline 10 ml followed by heparin 5 ml (100 units/ml). Atfer medication administration, flush with 10 ml normal saline followed by 5 ml heparin. For blood draw, flush with 10 ml normal saline, withdraw and waste 5 ml, obtain specimen, flush with 20 ml normal saline followed by 5 ml heparin.   Yes Historical Provider, MD  ondansetron (ZOFRAN) 8 MG tablet Take 8 mg by mouth every 8 (eight) hours as needed for nausea or vomiting.   Yes Historical Provider, MD  OXYGEN Inhale 3-5 L into the lungs continuous.   Yes Historical Provider, MD  Polyethyl Glycol-Propyl Glycol (SYSTANE OP) Apply 1-2 drops to eye daily as needed (dry eyes/irritaton).   Yes Historical Provider, MD  prochlorperazine (COMPAZINE) 10 MG tablet Take 10 mg by mouth every 6 (six) hours as needed for nausea or vomiting.  Yes Historical Provider, MD   No Known Allergies CBC:    Component Value Date/Time   WBC 10.5* 04/17/2014 0919   WBC 9.9 06/21/2013 0842   HGB 9.8* 04/17/2014 0919   HGB 14.5 06/21/2013 0842   HCT 31.7* 04/17/2014 0919   HCT 43.7 06/21/2013 0842   PLT 389 04/17/2014 0919   PLT 281 06/21/2013 0842   MCV 91.6 04/17/2014 0919   MCV 99.5 06/21/2013 0842   NEUTROABS 7.9* 04/17/2014 0919   LYMPHSABS 0.9 04/17/2014 0919   MONOABS 1.2* 04/17/2014 0919   EOSABS 0.5 04/17/2014 0919   BASOSABS 0.1 04/17/2014 0919   Comprehensive Metabolic Panel:      Component Value Date/Time   NA 136 04/17/2014 0920   K 4.5 04/17/2014 0920   CO2 26 04/17/2014 0920   BUN 9.5 04/17/2014 0920   CREATININE 0.6* 04/17/2014 0920   GLUCOSE 113 04/17/2014 0920   CALCIUM 8.9 04/17/2014 0920   AST 24 04/17/2014 0920   ALT 26 04/17/2014 0920   ALKPHOS 56 04/17/2014 0920   BILITOT 0.21 04/17/2014 0920   PROT 7.0 04/17/2014 0920   ALBUMIN 2.4* 04/17/2014 0920    Physical Exam: Vital Signs: BP 118/78 mmHg  Pulse 106  Temp(Src) 97.6 F (36.4 C) (Oral)  Resp 18  Ht 6\' 1"  (1.854 m)  SpO2 98% SpO2: SpO2: 98 % O2 Device: O2 Device: Nasal Cannula O2 Flow Rate: O2 Flow Rate (L/min): 3 L/min Intake/output summary: No intake or output data in the 24 hours ending 08/19/14 1631 LBM:   Baseline Weight:   Most recent weight:    Exam Findings:   Extremely cachectic chronically ill-appearing gentleman in moderate distress due to abdominal discomfort Shallow breathing S1-S2 Abdomen severe wasting tender in epigastrium and right upper quadrant area Awake alert unable to verbalize but communicates by writing on white board No edema but elongated digits mild clubbing                       Palliative Performance Scale: 20% Additional Data Reviewed: No results for input(s): WBC, HGB, PLT, NA, BUN, CREATININE, ALB in the last 72 hours.   Time In: 1500 Time Out: 1555 Time Total: 55 min Greater than 50%  of this time was spent counseling and coordinating care related to the above assessment and plan.  Signed by: Loistine Chance, MD [place], MD  08/19/2014, 4:31 PM  Please contact Palliative Medicine Team phone at [phone] for questions and concerns.

## 2014-08-19 NOTE — Progress Notes (Addendum)
This is a non-covered but related admission to patient's Hospice and Palliative Care Dx. of Oropharyngeal  Cancer. Patient's code status is DNR. Patient lives alone and was reporting increased left rib pain with shortness of breath today.  He had taken 30 mg of Morphine earlier this morning and was given 45 mg PO while his primary RN was present with no relief. His sats at home on 5L of oxygen were 97%. The plan was to move patient to [facility] for sx management and EOL but no beds available at this time. Patient is alert and oriented lying in the bed and his O2 is on 4L n/c. He answers questions with his dry erase board and with gesturing and nodding.  Hospice will continue to follow daily while in the hospital. Transfer summary and med list were placed on the shadow chart. Please call with questions.   [facility] Liaison [phone]

## 2014-08-19 NOTE — H&P (Signed)
[place]  [phone]    ADMISSION NOTE  Admitting MD: Heath Lark, MD  Attending MD: Heath Lark, MD  I have seen the patient, examined him and edited the notes as follows  HPI: Derek Morgan is an 59 y.o. male with a history of oropharyngeal cancer currently on Hospice program, admitted on 8/2 for end of life care, in abscence of beds at [facility] at this time. He is very cachectic, although able to interact. Denies fevers, chills, night sweats, or vision changes. He reports swallowing difficulties. Denies any respiratory complaints. Denies any chest pain or palpitations. Denies lower extremity swelling. Denies nausea, heartburn or change in bowel habits. Denies abdominal pain. He has failure to thrive. Denies any dysuria reports decreased urine output. Denies abnormal skin rashes, or neuropathy. Denies any bleeding issues such as epistaxis, hematemesis, hematuria or hematochezia.   Oncology History   Oropharyngeal cancer, HPV negative   Primary site: Pharynx - Oropharynx   Staging method: AJCC 7th Edition   Clinical: Stage IVC (T4b, N3, M1) signed by Heath Lark, MD on 07/01/2013  8:32 PM   Summary: Stage IVC (T4b, N3, M1)         Oropharyngeal cancer   05/06/2013 Imaging 4.2 x 4.7 x 6.1 cm heterogeneous right neck mass in the right level 2 & level 3 stations. It invades the carotid space, obstructing the internal jugular vein. The primary lesion may be along the inferior aspect of the right palatine tonsil    05/22/2013 Procedure Laryngoscopy revealed abnormalities beneath the right tonsil area. Unfortunately tonsillectomy and right neck biopsy came back benign tissue.   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   06/24/2013 Surgery He underwent multiple extraction of tooth numbers 2, 3, 6, 8, 9, 11, 12, 13, 14, 15, 19, 20, 22, 23, 24, 25, 26, 27, and 28 along with 4 Quadrants of alveoloplasty   06/28/2013 Imaging  PET/CT scan results show widespread pulmonary metastasis.   07/11/2013 - 11/07/2013 Chemotherapy The patient received palliative chemotherapy with 5-FU, carboplatin and weekly Erbitux.   09/09/2013 Imaging Repeat PET CT scan show good response to treatment.   11/14/2013 Imaging Repeat PET/CT showed disease progression   11/26/2013 - 12/18/2013 Radiation Therapy He received palliative radiation therapy to the neck and the back.   01/15/2014 Imaging Repeat PET/CT scan showed improvement in disease control around his neck but significant progression of pulmonary metastases   01/23/2014 - 03/27/2014 Chemotherapy He received palliative treatment with Taxol weekly   04/16/2014 Imaging Repeat PET CT scan showed disease progression     PMH:  Past Medical History  Diagnosis Date  . Heavy smoker   . Hoarseness of voice   . Mass in neck 04/2013    right/ Metastatic Squamous Cell Carcinoma  . Loose, teeth 06/24/2013  . Weight loss 06/17/2013  . Throat pain 06/17/2013  . Shortness of breath     with exertion at times  . Cancer     oral, pharyngeal  . History of radiation therapy 11/26/13- 12/18/13    right neck mass/C5 lesion 37.5 Gy 15 fractions    Surgeries:  Past Surgical History  Procedure Laterality Date  . Broken leg  1998    orif ankle-left-plated  . Direct laryngoscopy N/A 05/22/2013    Procedure: DIRECT LARYNGOSCOPY;  Surgeon: Ascencion Dike, MD;  Location: [facility] OR;  Service: ENT;  Laterality: N/A;  . Tonsillectomy Right 05/22/2013    Procedure: RIGHT TONSILLECTOMY;  Surgeon: Ascencion Dike, MD;  Location: [place];  Service: ENT;  Laterality: Right;  . Mass biopsy Right 05/22/2013    Procedure:  INCISIONAL BIOPSY NECK MASS;  Surgeon: Ascencion Dike, MD;  Location: [place];  Service: ENT;  Laterality: Right;  . Mass biopsy Right 06/07/2013    Procedure: EXCISIONAL BIOPSY OF RIGHT NECK MASS;  Surgeon: Ascencion Dike, MD;  Location: [place];  Service: ENT;  Laterality: Right;  . Multiple extractions with alveoloplasty N/A  06/24/2013    Procedure: Extraction of tooth #'s 2,3,6,8,9,11,12,13,14,15,19,20,22,23,24,25,26,27,28 with alveoloplasty.;  Surgeon: Lenn Cal, DDS;  Location: [place];  Service: Oral Surgery;  Laterality: N/A;  . Portacath placement Left 07/04/2013    Procedure: INSERTION PORT-A-CATH;  Surgeon: Merrie Roof, MD;  Location: [place];  Service: General;  Laterality: Left;    Allergies: No Known Allergies  Medications:   Prior to Admission:  Prescriptions prior to admission  Medication Sig Dispense Refill Last Dose  . amoxicillin-clavulanate (AUGMENTIN) 875-125 MG per tablet Take 1 tablet by mouth 2 (two) times daily. 14 tablet 0 Taking  . Diphenhyd-Lidocaine-Nystatin (FIRST-BXN MOUTHWASH) SUSP Take 5 mLs by mouth 4 (four) times daily. Verbal order from Dr. Alvy Bimler for these ingredients plus Maalox in a 1:1:1:1 concentration.  Called to [employer] for Quantity Sufficient. 1 Bottle 2 Taking  . docusate sodium (COLACE) 100 MG capsule Take 1 capsule (100 mg total) by mouth 2 (two) times daily. 60 capsule 0 Taking  . lidocaine-prilocaine (EMLA) cream Apply 1 application topically as needed. 30 g 6 Taking  . morphine (MSIR) 30 MG tablet Take 1 tablet (30 mg total) by mouth every 6 (six) hours as needed for severe pain. 90 tablet 0 Taking  . Ondansetron HCl (ZOFRAN PO) Take by mouth as needed.   Taking  . predniSONE (DELTASONE) 20 MG tablet Take 2 tablets (40 mg total) by mouth daily with breakfast. 20 tablet 0   . Prochlorperazine Maleate (COMPAZINE PO) Take by mouth as needed.   Taking    Scheduled Meds: . docusate sodium  100 mg Oral BID  . enoxaparin (LOVENOX) injection  40 mg Subcutaneous Q24H  . lidocaine-prilocaine   Topical Once  . magic mouthwash  5 mL Oral QID  . senna-docusate  1 tablet Oral BID   Continuous Infusions: . sodium chloride     PRN Meds:.acetaminophen, alum & mag hydroxide-simeth, guaiFENesin-dextromethorphan, hydrocortisone, morphine, morphine injection,  ondansetron **OR** ondansetron **OR** ondansetron (ZOFRAN) IV **OR** ondansetron (ZOFRAN) IV  Review of Systems:  Constitutional: Denies fevers, chills or abnormal night sweats. He reports being cold. Eyes: Denies blurriness of vision, double vision or watery eyes Ears, nose, mouth, throat, and face: He reports difficulty swallowing Respiratory: Denies cough, dyspnea at rest or wheezes Cardiovascular: Denies palpitation, chest discomfort or lower extremity swelling Gastrointestinal:  Denies nausea, heartburn or change in bowel habits. Denies abdominal pain Skin: Denies abnormal skin rashes Lymphatics: Denies new lymphadenopathy or easy bruising Neurological: Denies numbness, tingling or new weaknesses Behavioral/Psych: Mood is stable, no new changes  All other systems were reviewed with the patient and are negative  Family History:  Family History  Problem Relation Age of Onset  . CVA Mother   . CAD Father   . Cancer Father     lung ca    Social History:  reports that he has quit smoking. His smoking use included Cigarettes. He has a 10 pack-year smoking history. He has never used smokeless tobacco. He reports  that he drinks about 3.6 oz of alcohol per week. He reports that he does not use illicit drugs.  Physical Exam:   Filed Vitals:   08/19/14 1158  BP: 108/93  Pulse: 108  Temp: 97.6 F (36.4 C)  Resp: 18   There were no vitals filed for this visit.  GENERAL: alert, no distress. Appears uncomfortable due to dysphagia. Cachectic, chronically ill appearing. SKIN: skin color, texture, turgor are dry, no rashes or significant lesions. Several areas of ecchymoses EYES: right eye deviation, conjunctiva are pink and non-injected, sclera clear OROPHARYNX: tongue with radiation discoloration, unable to visualize thrush. Tongue very dry. No open lesions.  NECK: supple, thyroid normal size, non-tender, right mass in neck present LYMPH:  no palpable lymphadenopathy in the cervical,  axillary or inguinal LUNGS: decreased breath sounds at the bases, with normal breathing effort HEART: regular rate & rhythm and no murmurs and no lower extremity edema. Right port normal ABDOMEN: soft, non-tender and normal bowel sounds Musculoskeletal:muscle wasting throughout. PSYCH: alert & oriented x 3, unable to speak, but able to communicate in writing. NEURO: no focal motor/sensory deficits  LABS:  CBC pending     Component Value Date/Time   BILITOT 0.21 04/17/2014 0920   Assessment and Plan: 59 y.o. male with  Oropharyngeal cancer Unfortunately, the patient has significant disease progression despite 4 different lines of treatment. Currently he is on palliative care and hospice  He has been admitted for end of life issues due to lack of available beds at [facility]. Will obtain Palliative Care consultation to assess his immediate needs. I have initiated comfort care measures.  Anemia in neoplastic disease The patient denies recent history of bleeding such as epistaxis, hematuria or hematochezia. No transfusion is indicated due to end of life status.  Throat pain with difficulties swallowing Due to malignancy He has failed outpatient management and now required aggressive IV pain medication for good control Will provide supportive therapy with pain meds by the palliative care team's protocol. I will get his port access for possible IV pain medications  Failure to thrive Protein calorie malnutrition We'll provide medications for comfort and the patient can eat whatever he can with oral intake.  Metastatic disease to the lungs Will provide oxygen therapy for comfort  DNR  Discharge planning Social worker is aware of his disposition status. Hopefully we can transfer him to [facility] once bed is available  [facility] E 08/19/2014 12:22 PM Glendi Mohiuddin, MD 08/19/2014

## 2014-08-19 NOTE — Progress Notes (Signed)
[facility] [place] work note Patient is currently receiving hospice services, yet needed to be transported to inpatient facility for sx management of pain, eol. Patient lives alone and has sporadic care from a neighbor. Patient has wanted [facility] for eol care, when he can no longer care for himself at home and still desires this. There are no beds at [facility] at this time. Patient cannot talk and uses white board. He does become easily agitated with many questions.  LCSW to coordinate with hospital CSW the transfer to [facility] when bed becomes available.  Katherina Right, [place]

## 2014-08-19 NOTE — Telephone Encounter (Signed)
Hospice RN, Jill Poling, requests hospital admission for pain management and possible EOL care. No beds available at [facility].  S/w Robin in Patient Placement and pt assigned to room #1342.   Notified [place] and she will arrange ambulance transportation and she will call [address] [place] w/ nursing report.

## 2014-08-20 DIAGNOSIS — R627 Adult failure to thrive: Secondary | ICD-10-CM | POA: Diagnosis not present

## 2014-08-20 DIAGNOSIS — R1310 Dysphagia, unspecified: Secondary | ICD-10-CM | POA: Diagnosis not present

## 2014-08-20 DIAGNOSIS — C7802 Secondary malignant neoplasm of left lung: Secondary | ICD-10-CM | POA: Diagnosis not present

## 2014-08-20 DIAGNOSIS — Z789 Other specified health status: Secondary | ICD-10-CM

## 2014-08-20 DIAGNOSIS — Z515 Encounter for palliative care: Secondary | ICD-10-CM

## 2014-08-20 DIAGNOSIS — C7801 Secondary malignant neoplasm of right lung: Secondary | ICD-10-CM | POA: Diagnosis not present

## 2014-08-20 DIAGNOSIS — C109 Malignant neoplasm of oropharynx, unspecified: Secondary | ICD-10-CM | POA: Diagnosis not present

## 2014-08-20 DIAGNOSIS — R1011 Right upper quadrant pain: Secondary | ICD-10-CM

## 2014-08-20 NOTE — Progress Notes (Signed)
Derek Morgan   DOB:April 13, 1955   OZ#:[id number]   QQV#:[id number]  Patient Care Team: No Pcp Per Patient as PCP - General (General Practice) Heath Lark, MD as Consulting Physician (Hematology and Oncology) Leta Baptist, MD as Consulting Physician (Otolaryngology) Leota Sauers, RN as Oncology Nurse Navigator (Oncology) Eppie Gibson, MD as Attending Physician (Radiation Oncology) Karie Mainland, RD as Dietitian (Nutrition)  I have seen the patient, examined him and edited the notes as follows  Subjective: No new issues overnight. His rib pain is better controlled with current medication regimen. Denies fevers, chills, night sweats, or vision changes. He reports swallowing difficulties. Denies any respiratory complaints. Denies any chest pain or palpitations. Denies lower extremity swelling. Denies nausea, heartburn. No bowel movement since admission. Denies abdominal pain. He has failure to thrive. Denies any dysuria reports decreased urine output. Denies abnormal skin rashes, or neuropathy. Denies any bleeding issues such as epistaxis, hematemesis, hematuria or hematochezia.    Scheduled Meds: . magic mouthwash  5 mL Oral QID  .  morphine injection  2 mg Intravenous Q6H  . senna-docusate  1 tablet Oral BID   Continuous Infusions:  PRN Meds:.acetaminophen **OR** acetaminophen, acetaminophen, albuterol, alum & mag hydroxide-simeth, antiseptic oral rinse, diphenhydrAMINE, glycopyrrolate **OR** glycopyrrolate **OR** glycopyrrolate, guaiFENesin-dextromethorphan, haloperidol **OR** haloperidol **OR** haloperidol lactate, hydrocortisone, LORazepam **OR** LORazepam **OR** LORazepam, morphine, morphine injection, ondansetron **OR** ondansetron **OR** ondansetron (ZOFRAN) IV **OR** ondansetron (ZOFRAN) IV, traZODone  Objective:  Filed Vitals:   08/19/14 2235  BP: 127/82  Pulse: 103  Temp: 97.2 F (36.2 C)  Resp: 16      Intake/Output Summary (Last 24 hours) at 08/20/14 0655 Last data filed at  08/19/14 2246  Gross per 24 hour  Intake      0 ml  Output      0 ml  Net      0 ml    ECOG PERFORMANCE STATUS: 4   GENERAL: alert, no distress.Cachectic, chronically ill appearing. SKIN: skin color, texture, turgor are dry, no rashes or significant lesions. Several areas of ecchymoses EYES: right eye deviation, conjunctiva are pink and non-injected, sclera clear OROPHARYNX: tongue with radiation discoloration, unable to visualize thrush. Tongue very dry. No open lesions.  NECK: supple, thyroid normal size, non-tender, right mass in neck present LYMPH: no palpable lymphadenopathy in the cervical, axillary or inguinal LUNGS: decreased breath sounds at the bases, with normal breathing effort HEART: regular rate & rhythm and no murmurs and no lower extremity edema. Right port normal ABDOMEN: soft, non-tender and normal bowel sounds Musculoskeletal: muscle wasting throughout. PSYCH: alert & oriented x 3, unable to speak, but able to communicate in writing. NEURO: no focal motor/sensory deficits  Assessment/Plan: 59 y.o.   Oropharyngeal cancer Unfortunately, the patient has significant disease progression despite 4 different lines of treatment. Currently he is on palliative care and hospice  He was admitted on 8/2 for end of life issues due to lack of available beds at [facility]. Comfort care measures were initiated on admission  Palliative Care consultation was obtained on 8/2 to assess his immediate needs. Appreciate follow up.  Anemia in neoplastic disease The patient denies recent history of bleeding such as epistaxis, hematuria or hematochezia. No transfusion is indicated due to end of life status.  Throat pain with difficulties swallowing Due to malignancy He has failed outpatient management and now required aggressive IV pain medication for good control Supportive therapy with pain meds was provided by the palliative care team's protocol. Port access for possible IV  pain medications  Failure to thrive Protein calorie malnutrition Medications for comfort were provided, and the patient can eat whatever he can with oral intake.  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 Hopefully we can transfer him to [facility] once bed is available   Rondel Jumbo, PA-C 08/20/2014  6:55 AM Leonetta Mcgivern, MD 08/20/2014

## 2014-08-20 NOTE — Progress Notes (Signed)
Nutrition Brief Note  Pt screened for positive MST. Chart reviewed. Pt was on hospice services PTA and was admitted for EOL care as there are no beds currently available at [facility]; plans to transfer him when a bed becomes available. Pt now transitioning to comfort care.  No further nutrition interventions warranted at this time.  Please re-consult as needed.      Jarome Matin, RD, LDN Inpatient Clinical Dietitian Pager # [phone] After hours/weekend pager # [phone]

## 2014-08-20 NOTE — Progress Notes (Signed)
Pt for discharge to [facility].  [facility] placement was arranged by [facility] and Education officer, museum.   CSW notified MD.   CSW facilitated pt discharge needs including faxing pt discharge summary to [facility], providing RN phone number to call report, and arranging ambulance transport for pt to [facility].  CSW met with pt at bedside to notify pt that transport being arranged for pt to [facility]. Pt nodded in understanding and per [facility] RN note, pt notified by [facility] RN this morning about bed availability at [facility] today.  No further social work needs identified at this time.  CSW signing off.   Alison Murray, MSW, [place] [facility] [phone]

## 2014-08-20 NOTE — Progress Notes (Signed)
Daily Progress Note   Patient Name: Derek Morgan       Date: 08/20/2014 DOB: May 02, 1955  Age: 59 y.o. MRN#: [id number] Attending Physician: Heath Lark, MD Primary Care Physician: No PCP Per Patient Admit Date: 08/19/2014  Reason for Consultation/Follow-up: Inpatient hospice referral, Non pain symptom management and Pain control  Subjective: Patient sleeping but aroused on entering room.  He reports doing well overnight.  States that his pain has been controlled on his current regimen and requested that no changes be made to his regimen today.  Required 2 doses rescue medication (morphine 4mg  IV) with good relief of his pain.  He denies current pain, SOB, nausea, vomiting, or anxiety.  Denies needs and reports being happy with his current care plan.  Interval Events: Report slept fairly well overnight.  No concerns per patient or nursing.  Length of Stay: 1 day  Current Medications: Scheduled Meds:  . magic mouthwash  5 mL Oral QID  .  morphine injection  2 mg Intravenous Q6H  . senna-docusate  1 tablet Oral BID    Continuous Infusions:    PRN Meds: acetaminophen **OR** acetaminophen, acetaminophen, albuterol, alum & mag hydroxide-simeth, antiseptic oral rinse, diphenhydrAMINE, glycopyrrolate **OR** glycopyrrolate **OR** glycopyrrolate, guaiFENesin-dextromethorphan, haloperidol **OR** haloperidol **OR** haloperidol lactate, hydrocortisone, LORazepam **OR** LORazepam **OR** LORazepam, morphine, morphine injection, ondansetron **OR** ondansetron **OR** ondansetron (ZOFRAN) IV **OR** ondansetron (ZOFRAN) IV, traZODone  Palliative Performance Scale: 10%     Vital Signs: BP 102/71 mmHg  Pulse 105  Temp(Src) 96.3 F (35.7 C) (Axillary)  Resp 16  Ht 6\' 1"  (1.854 m)  SpO2 95% SpO2: SpO2: 95 % O2 Device: O2 Device: Nasal Cannula O2 Flow Rate: O2 Flow Rate (L/min): 3 L/min  Intake/output summary:  Intake/Output Summary (Last 24 hours) at 08/20/14 3149 Last data filed at 08/19/14  2246  Gross per 24 hour  Intake      0 ml  Output      0 ml  Net      0 ml   LBM:   Baseline Weight:   Most recent weight:    Physical Exam: Gen: Extremely cachectic chronically ill-appearing gentleman; NAD Resp: Shallow breathing, clear to auscultation CV: Regular, no murmur Abd: +BS, NTTP Neuro: Awake, alert Ext: Significant muscular wasting noted. No edema, but elongated digits mild clubbing           Additional Data Reviewed: No results for input(s): WBC, HGB, PLT, NA, BUN, CREATININE, ALB in the last 72 hours.   Problem List:  Patient Active Problem List   Diagnosis Date Noted  . 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 End of life care   . Right upper quadrant pain   . Left lower lobe pneumonia 04/17/2014  . Hypoxemia 04/17/2014  . DNR (do not resuscitate) discussion 12/23/2013  . Weakness generalized 12/23/2013  . Mucositis due to antineoplastic therapy 12/05/2013  . 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 Leukopenia due to antineoplastic chemotherapy 08/22/2013  . Thrombocytopenia due to drugs 08/22/2013  . Skin rash 08/01/2013  . Dental caries 06/24/2013  . Weight loss 06/17/2013  . 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 Tobacco abuse 06/17/2013  . Throat pain 06/17/2013     Palliative Care Assessment & Plan    Code Status:  DNR  Goals of Care:  Patient admitted for inpatient hospice care.  Continue same aggressive symptom management.   Plan for transfer to [facility] when bed becomes available.   Desire for further Chaplaincy support:no  3. Symptom Management:  Pain: Well controlled on current regimen.  He has has total of 14mg  IV morphine in the last 24 hours.  States that he is happy with his current regimen and requests that no changes be made.  SOB: Reports well controlled on current regimen.  Continue same.  Nausea: Not currently an issue for patient.  Zofran ordered prn.  4. Palliative Prophylaxis:  Reports last BM day before yesterday.  Has  not been taking anything PO per nursing.  Will continue to monitor.  5. Prognosis: < 2 weeks  5. Discharge Planning: Hospice facility   Care plan was discussed with patient, bedside nurse  Thank you for allowing the Palliative Medicine Team to assist in the care of this patient.   Time In: 8:15 Time Out: 8:35 Total Time 20 Prolonged Time Billed  No     Greater than 50%  of this time was spent counseling and coordinating care related to the above assessment and plan.   Micheline Rough, MD  08/20/2014, 8:23 AM  Please contact Palliative Medicine Team phone at [phone] for questions and concerns.

## 2014-08-20 NOTE — Discharge Summary (Signed)
Patient ID: Derek Morgan MRN: [id number] DOB/AGE: 58-Jan-1957 58 y.o.  Admit date: 08/19/2014 Discharge date: 08/20/2014  Patient Care Team: No Pcp Per Patient as PCP - General ([facility]) Heath Lark, MD as Consulting Physician (Hematology and Oncology) Leta Baptist, MD as Consulting Physician (Otolaryngology) Leota Sauers, RN as Oncology Nurse Navigator (Oncology) Eppie Gibson, MD as Attending Physician (Radiation Oncology) Karie Mainland, RD as Dietitian (Nutrition)  Brief History of Present Illness: For complete details please refer to admission H and P, but in brief,   Discharge Diagnoses/Hospital Course:   Oropharyngeal cancer Unfortunately, the patient has significant disease progression despite 4 different lines of treatment. Currently he is on palliative care and hospice  He was admitted on 8/2 for end of life issues due to lack of available beds at [facility]. Comfort care measures were initiated on admission Palliative Care consultation was obtained on 8/2 to assess his immediate needs. Appreciate follow up. He is now being discharged to that facility as bed became available  Anemia in neoplastic disease The patient denies recent history of bleeding such as epistaxis, hematuria or hematochezia. No transfusion is indicated due to end of life status.  Throat pain with difficulties swallowing Due to malignancy He has failed outpatient management and now required aggressive IV pain medication for good control Supportive therapy with pain meds was provided by the palliative care team's protocol. Port access for possible IV pain medications  Failure to thrive Protein calorie malnutrition Medications for comfort were provided, and the patient can eat whatever he can with oral intake.  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 Heavy smoker   . Hoarseness of voice   . Mass in  neck 04/2013    right/ Metastatic Squamous Cell Carcinoma  . Loose, teeth 06/24/2013  . Weight loss 06/17/2013  . Throat pain 06/17/2013  . Shortness of breath     with exertion at times  . Cancer     oral, pharyngeal  . History of radiation therapy 11/26/13- 12/18/13    right neck mass/C5 lesion 37.5 Gy 15 fractions    Discharge Medications:    Medication List    TAKE these medications        albuterol (2.5 MG/3ML) 0.083% nebulizer solution  Commonly known as:  PROVENTIL  Take 2.5 mg by nebulization every 6 (six) hours as needed for wheezing or shortness of breath.     camphor-menthol lotion  Commonly known as:  SARNA  Apply 1 application topically 4 (four) times daily as needed for itching (face/neck for dry itch skin).     docusate sodium 100 MG capsule  Commonly known as:  COLACE  Take 1 capsule (100 mg total) by mouth 2 (two) times daily.     FIRST-BXN MOUTHWASH Susp  Take 5 mLs by mouth 4 (four) times daily. Verbal order from Dr. Alvy Bimler for these ingredients plus Maalox in a 1:1:1:1 concentration.  Called to [employer] for Quantity Sufficient.     guaiFENesin 100 MG/5ML liquid  Commonly known as:  ROBITUSSIN  Take 200 mg by mouth 4 (four) times daily as needed for cough (mouth irritation.).     lidocaine-prilocaine cream  Commonly known as:  EMLA  Apply 1 application topically as needed.     morphine 30 MG tablet  Commonly known as:  MSIR  Take 1 tablet (30 mg total) by mouth every 6 (  six) hours as needed for severe pain.     NON FORMULARY  Venous Access Device: flush Implanted Vascular Access Device flush every six weeks with normal saline 10 ml followed by heparin 5 ml (100 units/ml). Atfer medication administration, flush with 10 ml normal saline followed by 5 ml heparin. For blood draw, flush with 10 ml normal saline, withdraw and waste 5 ml, obtain specimen, flush with 20 ml normal saline followed by 5 ml heparin.     ondansetron 8 MG tablet  Commonly  known as:  ZOFRAN  Take 8 mg by mouth every 8 (eight) hours as needed for nausea or vomiting.     OXYGEN  Inhale 3-5 L into the lungs continuous.     prochlorperazine 10 MG tablet  Commonly known as:  COMPAZINE  Take 10 mg by mouth every 6 (six) hours as needed for nausea or vomiting.     SYSTANE OP  Apply 1-2 drops to eye daily as needed (dry eyes/irritaton).        Discharge Condition: fair  Diet recommendation: Regular.   Disposition and Follow-up: to [facility] life care     ECOG PERFORMANCE STATUS:4  Physical Exam at Discharge:  Subjective:  No new issues overnight. His rib pain is better controlled with current medication regimen. Denies fevers, chills, night sweats, or vision changes. He reports swallowing difficulties. Denies any respiratory complaints. Denies any chest pain or palpitations. Denies lower extremity swelling. Denies nausea, heartburn. No bowel movement since admission. Denies abdominal pain. He has failure to thrive. Denies any dysuria reports decreased urine output. Denies abnormal skin rashes, or neuropathy. Denies any bleeding issues such as epistaxis, hematemesis, hematuria or hematochezia.   Objective:  BP 102/71 mmHg  Pulse 105  Temp(Src) 96.3 F (35.7 C) (Axillary)  Resp 16  Ht 6\' 1"  (1.854 m)  SpO2 95%  GENERAL: alert, no distress.Cachectic, chronically ill appearing. SKIN: skin color, texture, turgor are dry, no rashes or significant lesions. Several areas of ecchymoses EYES: right eye deviation, conjunctiva are pink and non-injected, sclera clear OROPHARYNX: tongue with radiation discoloration, unable to visualize thrush. Tongue very dry. No open lesions.  NECK: supple, thyroid normal size, non-tender, right mass in neck present LYMPH: no palpable lymphadenopathy in the cervical, axillary or inguinal LUNGS: decreased breath sounds at the bases, with normal breathing effort HEART: regular rate & rhythm and no murmurs and  no lower extremity edema. Right port normal ABDOMEN: soft, non-tender and normal bowel sounds Musculoskeletal: muscle wasting throughout. PSYCH: alert & oriented x 3, unable to speak, but able to communicate in writing. NEURO: no focal motor/sensory deficits NEURO: no focal motor/sensory deficits    Significant Diagnostic Studies:  No results found.  Discharge Laboratory Values: None  Signed: Sharene Butters, PA-C 08/20/2014, 9:56 AM Alvy Bimler, Pattie Flaharty, MD 08/20/2014

## 2014-08-20 NOTE — Progress Notes (Signed)
This is a non-covered but related admission to patient's Hospice and Palliative Care Dx. of Oropharyngeal  Cancer. Patient's code status is DNR. Patient lying in bed this morning asleep. Pt.advised that a [facility] bed was available and he would be transferred there today. Pt. nodded understanding. Pt. Has received 14 mg of Morphine IV in the last 24 hours and denies pain at this time. [place]anna CSW and patient's RN advised of transfer.  Transfer summary and med list remain on the shadow chart. Please call with questions.  [facility] Liaison [phone]

## 2014-08-28 ENCOUNTER — Encounter: Payer: Self-pay | Admitting: *Deleted

## 2014-08-28 NOTE — Progress Notes (Signed)
Received note from [facility] pt passed away last night.  Dr. Alvy Bimler notified.

## 2014-09-18 DEATH — deceased

## 2016-05-10 IMAGING — CT NM PET TUM IMG RESTAG (PS) SKULL BASE T - THIGH
8 series · 25 of 25 positions shown · non-contrast
Comparison: 09/09/2013

CLINICAL DATA: Subsequent treatment strategy for head and neck
cancer.

EXAM:
NUCLEAR MEDICINE PET SKULL BASE TO THIGH
TECHNIQUE: 5.3 mCi F-18 FDG was injected intravenously. Full-ring PET imaging
was performed from the skull base to thigh after the radiotracer. CT
data was obtained and used for attenuation correction and anatomic
localization.
FASTING BLOOD GLUCOSE:  Value: 123 mg/dl

[Series 3: pet hn_sk_thigh ac · axial · 5.0mm · 4.07mm/px · z∈[-516,+408]mm · 5 of 232 slices shown]
[im 1/232]
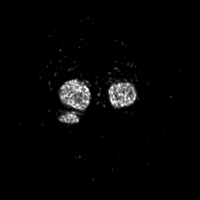
[im 58/232]
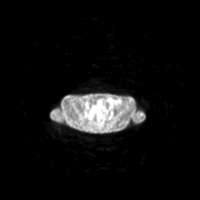
[im 116/232]
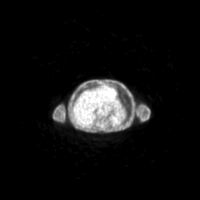
[im 174/232]
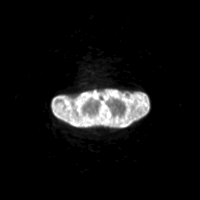
[im 232/232]
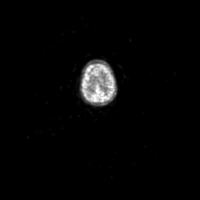

[Series 4: ct hn_sk_th 5.0 b31f · axial · 5.0mm · 0.82mm/px · z∈[-516,+408]mm · 5 of 232 slices shown]
[im 1/232]
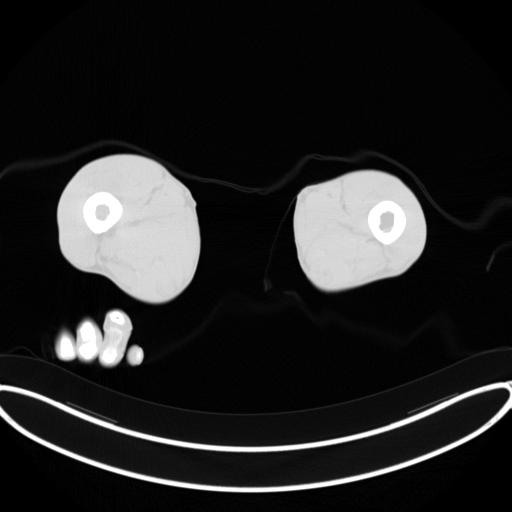
[im 58/232]
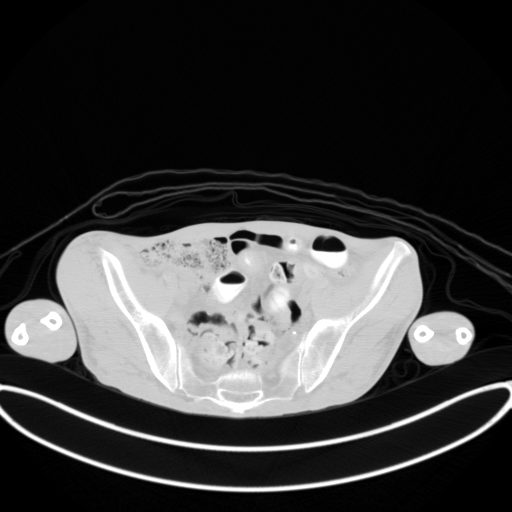
[im 116/232]
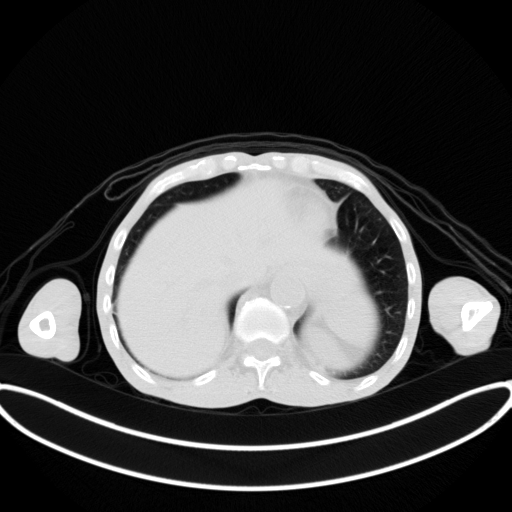
[im 174/232]
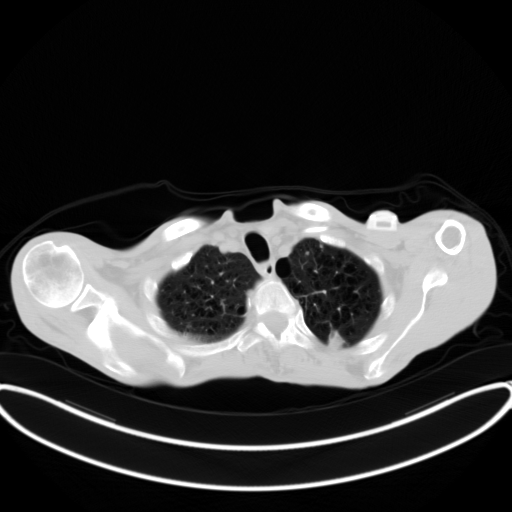
[im 232/232  brain]
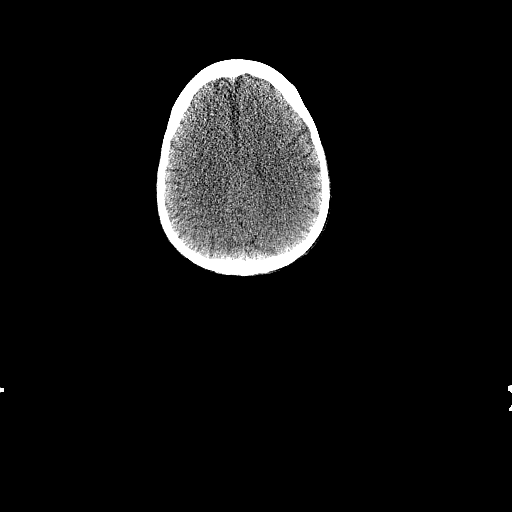

[Series 6: ct hn_sk_th 5.0 b70f lung_bone · axial · 5.0mm · 0.62mm/px · z∈[-104,+224]mm · 2 of 83 slices shown]
[im 1/83  bone]
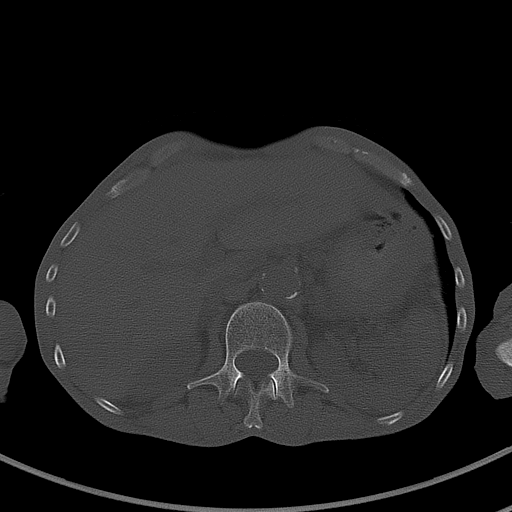
[im 83/83  bone]
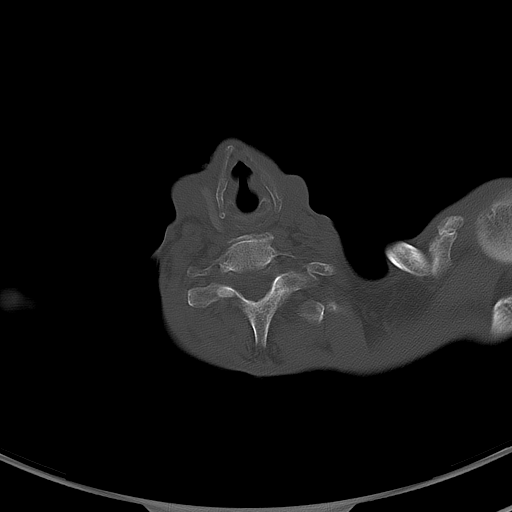

[Series 8: pet hn_sk_thigh nac · axial · 5.0mm · 4.07mm/px · z∈[-516,+408]mm · 5 of 232 slices shown]
[im 1/232]
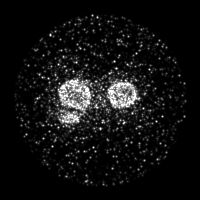
[im 58/232]
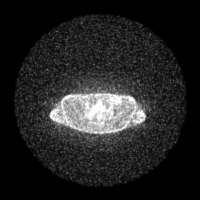
[im 116/232]
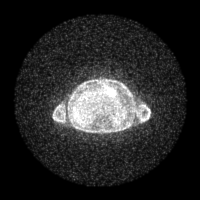
[im 174/232]
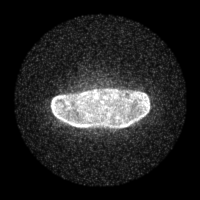
[im 232/232]
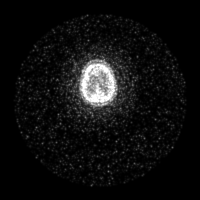

[Series 603: mip collection<mip range> · coronal · 1.92mm/px · 1 of 32 slices shown]
[im 1/32]
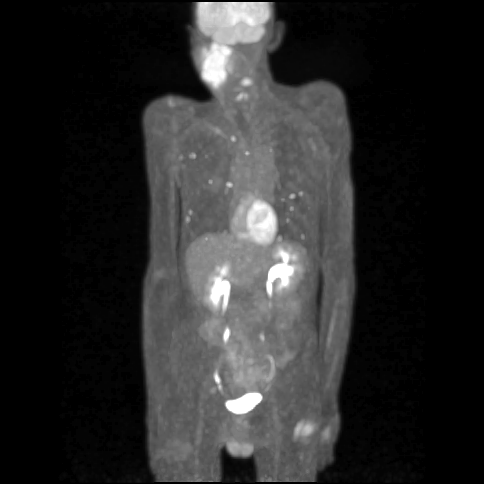

[Series 604: range-ct hn_sk_th 5.0 (id)<alpha range> · 1 of 48 slices shown (1 of 2)]
[im 1/48]
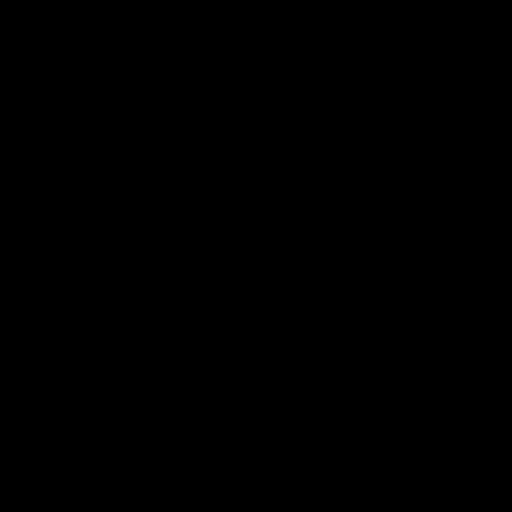

[Series 605: range-ct hn_sk_th 5.0 (id)<alpha range> · 5 of 218 slices shown (2 of 2)]
[im 1/218]
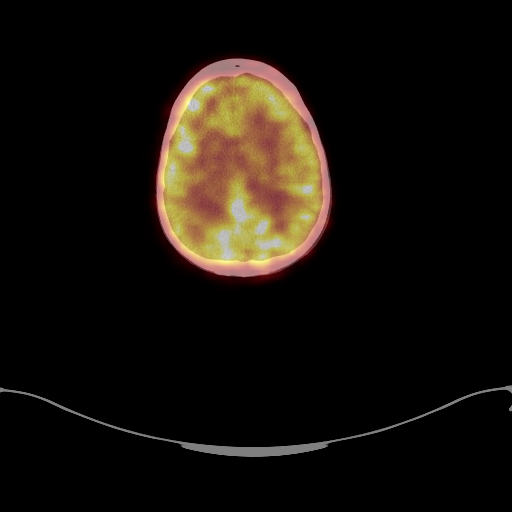
[im 55/218]
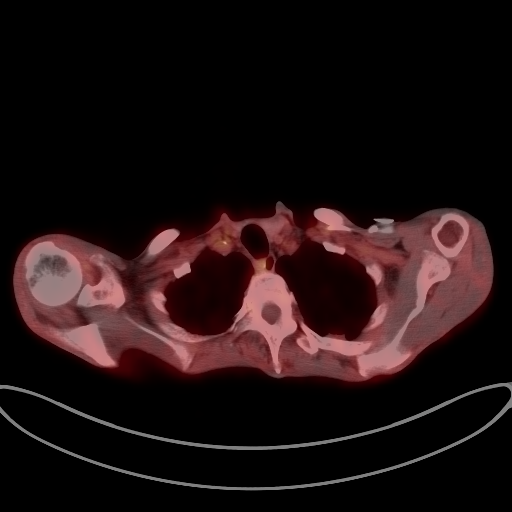
[im 109/218]
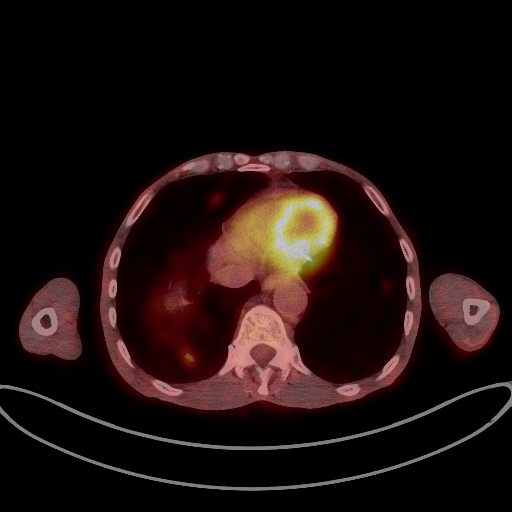
[im 163/218]
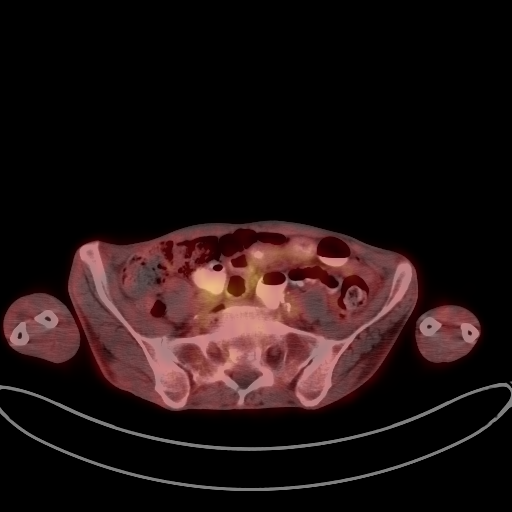
[im 218/218]
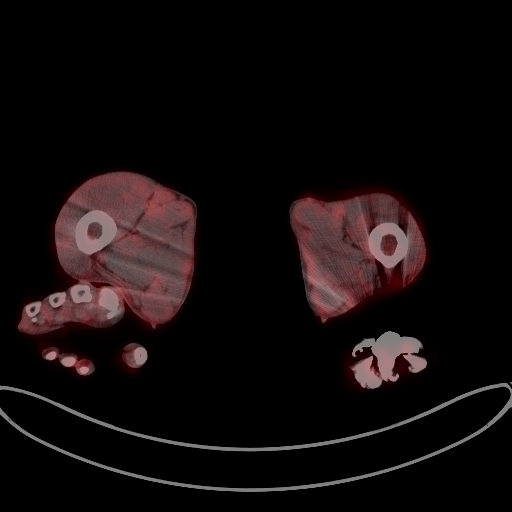

[Series 1200: results mm oncology reading · 1.47mm/px · 1 of 5 slices shown]
[im 1/5]
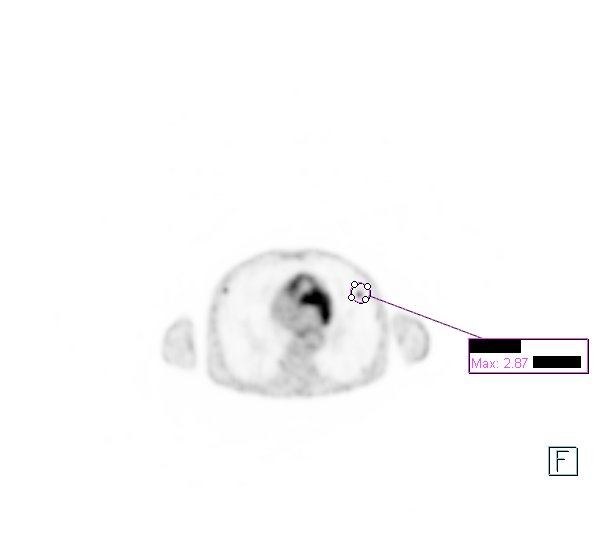

[25 of 25 positions shown; findings below may reference images not displayed]

FINDINGS: NECK

Large mass within the upper right cervical region measures 6.6 x
cm. This has an SUV max equal to 7.7. On the previous exam this mass
measured 4.9 x 5.4 cm and had an SUV max equal to 8.6.No
hypermetabolic lymph nodes in the neck. The mass appears to be
invading the base of skull as well as the lateral aspect of the C2
vertebra.

CHEST

Numerous hypermetabolic pulmonary nodules are again identified.
Index nodule within the subpleural left lower lobe measures 1.5 cm
and has an SUV max equal to 2.9. On the previous examination this
nodule measured 1.3 cm and had an SUV max equal to the 2.9.
Subpleural nodule within the medial right lung base measures 1.1 cm
and has an SUV max equal to 3.9. On the previous exam this nodule
measure 1.2 cm and had an SUV max equal to 3.1.

Decrease in FDG uptake associated with the mediastinal and right
hilar lymph nodes. SUV max within the right hilar region is equal to
1.6. Previously 3.8. SUV max associated with the sub- carinal lymph
nodes the equal to 2.0. Previously 3.9.

ABDOMEN/PELVIS

No abnormal hypermetabolic activity within the liver, pancreas,
adrenal glands, or spleen. No hypermetabolic lymph nodes in the
abdomen or pelvis.

SKELETON

New abnormal FDG uptake within the superior endplate of the C5
vertebra is identified. This has an SUV max equal to 5.3. There is
new abnormal sclerosis on the corresponding CT images.
IMPRESSION: 1. Interval increase in the right upper neck mass which is extending
into the base of skull and C1 vertebra.
2. No significant change in size or degree of FDG uptake associated
with the index pulmonary nodules.
3. New hypermetabolic metastasis to the C5 vertebra.
